# Patient Record
Sex: Female | Born: 1945
Health system: Southern US, Community
[De-identification: ages and names within clinical notes are randomized; demographics above are authoritative.]

## PROBLEM LIST (undated history)

## (undated) DIAGNOSIS — R011 Cardiac murmur, unspecified: Secondary | ICD-10-CM

## (undated) DIAGNOSIS — H409 Unspecified glaucoma: Secondary | ICD-10-CM

## (undated) DIAGNOSIS — H269 Unspecified cataract: Secondary | ICD-10-CM

## (undated) DIAGNOSIS — M1990 Unspecified osteoarthritis, unspecified site: Secondary | ICD-10-CM

## (undated) DIAGNOSIS — M810 Age-related osteoporosis without current pathological fracture: Secondary | ICD-10-CM

## (undated) DIAGNOSIS — M4800 Spinal stenosis, site unspecified: Secondary | ICD-10-CM

## (undated) DIAGNOSIS — F419 Anxiety disorder, unspecified: Secondary | ICD-10-CM

## (undated) DIAGNOSIS — K635 Polyp of colon: Secondary | ICD-10-CM

## (undated) DIAGNOSIS — C801 Malignant (primary) neoplasm, unspecified: Secondary | ICD-10-CM

## (undated) DIAGNOSIS — I10 Essential (primary) hypertension: Secondary | ICD-10-CM

## (undated) DIAGNOSIS — M8580 Other specified disorders of bone density and structure, unspecified site: Secondary | ICD-10-CM

## (undated) DIAGNOSIS — J8410 Pulmonary fibrosis, unspecified: Secondary | ICD-10-CM

## (undated) DIAGNOSIS — I059 Rheumatic mitral valve disease, unspecified: Secondary | ICD-10-CM

## (undated) DIAGNOSIS — F32A Depression, unspecified: Secondary | ICD-10-CM

## (undated) DIAGNOSIS — T7840XA Allergy, unspecified, initial encounter: Secondary | ICD-10-CM

## (undated) DIAGNOSIS — K219 Gastro-esophageal reflux disease without esophagitis: Secondary | ICD-10-CM

## (undated) DIAGNOSIS — E785 Hyperlipidemia, unspecified: Secondary | ICD-10-CM

## (undated) DIAGNOSIS — I350 Nonrheumatic aortic (valve) stenosis: Secondary | ICD-10-CM

## (undated) HISTORY — DX: Unspecified osteoarthritis, unspecified site: M19.90

## (undated) HISTORY — DX: Spinal stenosis, site unspecified: M48.00

## (undated) HISTORY — DX: Essential (primary) hypertension: I10

## (undated) HISTORY — DX: Rheumatic mitral valve disease, unspecified: I05.9

## (undated) HISTORY — DX: Malignant (primary) neoplasm, unspecified: C80.1

## (undated) HISTORY — DX: Anxiety disorder, unspecified: F41.9

## (undated) HISTORY — DX: Depression, unspecified: F32.A

## (undated) HISTORY — DX: Nonrheumatic aortic (valve) stenosis: I35.0

## (undated) HISTORY — DX: Gastro-esophageal reflux disease without esophagitis: K21.9

## (undated) HISTORY — DX: Polyp of colon: K63.5

## (undated) HISTORY — DX: Allergy, unspecified, initial encounter: T78.40XA

## (undated) HISTORY — DX: Other specified disorders of bone density and structure, unspecified site: M85.80

## (undated) HISTORY — DX: Hyperlipidemia, unspecified: E78.5

## (undated) HISTORY — DX: Cardiac murmur, unspecified: R01.1

## (undated) HISTORY — DX: Age-related osteoporosis without current pathological fracture: M81.0

## (undated) HISTORY — PX: WISDOM TOOTH EXTRACTION: SHX21

## (undated) HISTORY — PX: BREAST CYST ASPIRATION: SHX578

## (undated) HISTORY — DX: Unspecified cataract: H26.9

## (undated) HISTORY — PX: TUBAL LIGATION: SHX77

## (undated) HISTORY — DX: Pulmonary fibrosis, unspecified: J84.10

## (undated) HISTORY — DX: Unspecified glaucoma: H40.9

## (undated) NOTE — *Deleted (*Deleted)
Health Maintenance Due  Topic Date Due  . INFLUENZA VACCINE  12/09/2019   Depression screen [facility] 2/9 12/14/2019 10/24/2019 09/13/2019  Decreased Interest 0 0 0  Down, Depressed, Hopeless 0 0 0  PHQ - 2 Score 0 0 0  Altered sleeping 0 0 0  Tired, decreased energy 1 0 0  Change in appetite 1 0 0  Feeling bad or failure about yourself  0 0 0  Trouble concentrating 0 0 0  Moving slowly or fidgety/restless 0 0 0  Suicidal thoughts 0 0 0  PHQ-9 Score 2 0 0  Difficult doing work/chores Not difficult at all Not difficult at all Not difficult at all

---

## 2002-05-28 ENCOUNTER — Encounter: Payer: Self-pay | Admitting: Internal Medicine

## 2002-05-28 ENCOUNTER — Encounter: Admission: RE | Admit: 2002-05-28 | Discharge: 2002-05-28 | Payer: Self-pay | Admitting: Internal Medicine

## 2003-05-28 ENCOUNTER — Other Ambulatory Visit: Admission: RE | Admit: 2003-05-28 | Discharge: 2003-05-28 | Payer: Self-pay | Admitting: Obstetrics and Gynecology

## 2004-05-29 ENCOUNTER — Other Ambulatory Visit: Admission: RE | Admit: 2004-05-29 | Discharge: 2004-05-29 | Payer: Self-pay | Admitting: Obstetrics and Gynecology

## 2005-06-11 ENCOUNTER — Other Ambulatory Visit: Admission: RE | Admit: 2005-06-11 | Discharge: 2005-06-11 | Payer: Self-pay | Admitting: Obstetrics and Gynecology

## 2005-10-26 ENCOUNTER — Ambulatory Visit: Payer: Self-pay | Admitting: Internal Medicine

## 2005-11-11 ENCOUNTER — Ambulatory Visit: Payer: Self-pay | Admitting: Internal Medicine

## 2005-12-08 ENCOUNTER — Ambulatory Visit: Payer: Self-pay | Admitting: Internal Medicine

## 2005-12-08 ENCOUNTER — Encounter: Payer: Self-pay | Admitting: Internal Medicine

## 2005-12-08 ENCOUNTER — Encounter (INDEPENDENT_AMBULATORY_CARE_PROVIDER_SITE_OTHER): Payer: Self-pay | Admitting: Specialist

## 2005-12-08 LAB — HM COLONOSCOPY

## 2006-02-24 ENCOUNTER — Ambulatory Visit: Payer: Self-pay | Admitting: Internal Medicine

## 2006-04-27 ENCOUNTER — Ambulatory Visit: Payer: Self-pay | Admitting: Internal Medicine

## 2006-04-27 LAB — CONVERTED CEMR LAB
ALT: 30 units/L (ref 0–40)
AST: 28 units/L (ref 0–37)
Albumin: 4 g/dL (ref 3.5–5.2)
Alkaline Phosphatase: 75 units/L (ref 39–117)
Bilirubin, Direct: 0.2 mg/dL (ref 0.0–0.3)
Chol/HDL Ratio, serum: 3
Cholesterol: 141 mg/dL (ref 0–200)
HDL: 46.4 mg/dL (ref >39.0)
LDL Cholesterol: 83 mg/dL (ref 0–99)
Total Bilirubin: 1.2 mg/dL (ref 0.3–1.2)
Total Protein: 7.2 g/dL (ref 6.0–8.3)
Triglyceride fasting, serum: 59 mg/dL (ref 0–149)
VLDL: 12 mg/dL (ref 0–40)

## 2006-05-25 ENCOUNTER — Ambulatory Visit: Payer: Self-pay | Admitting: Internal Medicine

## 2006-08-09 ENCOUNTER — Ambulatory Visit: Payer: Self-pay | Admitting: Internal Medicine

## 2006-09-10 ENCOUNTER — Emergency Department (HOSPITAL_COMMUNITY): Admission: EM | Admit: 2006-09-10 | Discharge: 2006-09-10 | Payer: Self-pay | Admitting: Emergency Medicine

## 2006-09-16 ENCOUNTER — Ambulatory Visit: Payer: Self-pay | Admitting: Internal Medicine

## 2006-11-02 ENCOUNTER — Encounter (INDEPENDENT_AMBULATORY_CARE_PROVIDER_SITE_OTHER): Payer: Self-pay | Admitting: Internal Medicine

## 2006-11-02 LAB — CONVERTED CEMR LAB
Cholesterol: 160 mg/dL
HDL: 53 mg/dL
LDL Cholesterol: 88 mg/dL
Triglycerides: 95 mg/dL

## 2006-12-06 DIAGNOSIS — K219 Gastro-esophageal reflux disease without esophagitis: Secondary | ICD-10-CM

## 2006-12-06 DIAGNOSIS — I059 Rheumatic mitral valve disease, unspecified: Secondary | ICD-10-CM

## 2006-12-06 DIAGNOSIS — E785 Hyperlipidemia, unspecified: Secondary | ICD-10-CM | POA: Insufficient documentation

## 2006-12-06 DIAGNOSIS — I10 Essential (primary) hypertension: Secondary | ICD-10-CM

## 2006-12-06 HISTORY — DX: Essential (primary) hypertension: I10

## 2006-12-06 HISTORY — DX: Rheumatic mitral valve disease, unspecified: I05.9

## 2006-12-06 HISTORY — DX: Gastro-esophageal reflux disease without esophagitis: K21.9

## 2006-12-06 HISTORY — DX: Hyperlipidemia, unspecified: E78.5

## 2006-12-21 ENCOUNTER — Ambulatory Visit: Payer: Self-pay | Admitting: Internal Medicine

## 2006-12-21 DIAGNOSIS — Z86010 Personal history of colonic polyps: Secondary | ICD-10-CM

## 2006-12-21 LAB — CONVERTED CEMR LAB
Cholesterol, target level: 200 mg/dL
HDL goal, serum: 40 mg/dL
LDL Goal: 130 mg/dL

## 2007-02-27 ENCOUNTER — Encounter: Payer: Self-pay | Admitting: Internal Medicine

## 2007-03-01 ENCOUNTER — Telehealth (INDEPENDENT_AMBULATORY_CARE_PROVIDER_SITE_OTHER): Payer: Self-pay | Admitting: *Deleted

## 2007-03-02 ENCOUNTER — Ambulatory Visit: Payer: Self-pay | Admitting: Internal Medicine

## 2007-07-05 ENCOUNTER — Encounter: Payer: Self-pay | Admitting: Internal Medicine

## 2007-07-19 ENCOUNTER — Ambulatory Visit: Payer: Self-pay | Admitting: Internal Medicine

## 2007-07-21 LAB — CONVERTED CEMR LAB
Albumin: 4 g/dL (ref 3.5–5.2)
Alkaline Phosphatase: 62 units/L (ref 39–117)
LDL Cholesterol: 77 mg/dL (ref 0–99)
Total CHOL/HDL Ratio: 2.8
VLDL: 12 mg/dL (ref 0–40)

## 2007-11-17 ENCOUNTER — Ambulatory Visit: Payer: Self-pay | Admitting: Internal Medicine

## 2007-12-01 ENCOUNTER — Ambulatory Visit: Payer: Self-pay | Admitting: Internal Medicine

## 2007-12-01 LAB — CONVERTED CEMR LAB
Bilirubin Urine: NEGATIVE
Blood in Urine, dipstick: NEGATIVE
Ketones, urine, test strip: NEGATIVE
Protein, U semiquant: NEGATIVE
Urobilinogen, UA: 0.2
pH: 7

## 2007-12-04 ENCOUNTER — Encounter: Payer: Self-pay | Admitting: Internal Medicine

## 2007-12-06 ENCOUNTER — Telehealth: Payer: Self-pay | Admitting: Internal Medicine

## 2007-12-20 ENCOUNTER — Ambulatory Visit: Payer: Self-pay | Admitting: Internal Medicine

## 2007-12-22 ENCOUNTER — Telehealth: Payer: Self-pay | Admitting: Internal Medicine

## 2008-02-20 ENCOUNTER — Ambulatory Visit: Payer: Self-pay | Admitting: Internal Medicine

## 2008-03-21 ENCOUNTER — Telehealth: Payer: Self-pay | Admitting: Internal Medicine

## 2008-03-25 ENCOUNTER — Ambulatory Visit: Payer: Self-pay | Admitting: Internal Medicine

## 2008-03-25 LAB — CONVERTED CEMR LAB
CK-MB: 1.2 ng/mL (ref 0.3–4.0)
Total CK: 61 units/L (ref 7–177)

## 2008-10-18 ENCOUNTER — Encounter: Payer: Self-pay | Admitting: Internal Medicine

## 2008-10-23 ENCOUNTER — Encounter: Payer: Self-pay | Admitting: Internal Medicine

## 2009-02-17 ENCOUNTER — Ambulatory Visit: Payer: Self-pay | Admitting: Internal Medicine

## 2009-02-28 ENCOUNTER — Encounter (INDEPENDENT_AMBULATORY_CARE_PROVIDER_SITE_OTHER): Payer: Self-pay | Admitting: *Deleted

## 2009-03-12 ENCOUNTER — Telehealth: Payer: Self-pay | Admitting: Internal Medicine

## 2009-05-10 LAB — HM COLONOSCOPY: HM Colonoscopy: NORMAL

## 2009-09-08 ENCOUNTER — Ambulatory Visit: Payer: Self-pay | Admitting: Family Medicine

## 2009-10-27 ENCOUNTER — Encounter: Payer: Self-pay | Admitting: Internal Medicine

## 2009-11-19 ENCOUNTER — Telehealth: Payer: Self-pay | Admitting: Internal Medicine

## 2010-04-08 ENCOUNTER — Encounter (INDEPENDENT_AMBULATORY_CARE_PROVIDER_SITE_OTHER): Payer: Self-pay | Admitting: *Deleted

## 2010-05-10 LAB — HM MAMMOGRAPHY: HM Mammogram: NORMAL

## 2010-05-10 LAB — HM PAP SMEAR: HM Pap smear: NORMAL

## 2010-05-14 ENCOUNTER — Other Ambulatory Visit: Payer: Self-pay | Admitting: Internal Medicine

## 2010-05-14 ENCOUNTER — Ambulatory Visit: Payer: No Typology Code available for payment source | Admitting: Internal Medicine

## 2010-05-14 ENCOUNTER — Ambulatory Visit
Admission: RE | Admit: 2010-05-14 | Discharge: 2010-05-14 | Payer: Self-pay | Source: Home / Self Care | Attending: Internal Medicine | Admitting: Internal Medicine

## 2010-05-14 ENCOUNTER — Encounter: Payer: Self-pay | Admitting: Internal Medicine

## 2010-05-14 DIAGNOSIS — I350 Nonrheumatic aortic (valve) stenosis: Secondary | ICD-10-CM

## 2010-05-14 DIAGNOSIS — I351 Nonrheumatic aortic (valve) insufficiency: Secondary | ICD-10-CM | POA: Insufficient documentation

## 2010-05-14 HISTORY — DX: Nonrheumatic aortic (valve) stenosis: I35.0

## 2010-05-14 NOTE — Assessment & Plan Note (Signed)
Needs followup labs. We'll check today.

## 2010-05-14 NOTE — Assessment & Plan Note (Signed)
Well-controlled. Continue current medications. Check laboratory work today.

## 2010-05-14 NOTE — Progress Notes (Signed)
Subjective:     Patient ID: Destiny Acosta is a 65 y.o. female.  HPI CC:  f/u and fasting.  History of Present Illness:   patient comes in for followup. She has hyperlipidemia. His fall. She has hypertension. Needs laboratory followup. In addition she's recently been treated for onychomycosis with Lamisil.   She is followed at [facility] 4 aortic valve disorder.  She denies any other complaints and a complete review of systems.  Current Problems (verified):  1)  Aortic Stenosis-follwed At [facility]  (ICD-424.1) 2)  Colonic Polyps, Hx of  (ICD-V12.72) 3)  Family History Diabetes 1st Degree Relative  (ICD-V18.0) 4)  Family History of Colon Ca 1st Degree Relative <60  (ICD-V16.0) 5)  Mitral Valve Prolapse  (ICD-424.0) 6)  Hypertension  (ICD-401.9) 7)  Hyperlipidemia  (ICD-272.4) 8)  Gerd  (ICD-530.81)  Current Medications (verified):  1)  Lisinopril 20 Mg Tabs (Lisinopril) .Marland Kitchen.. 1 Tablet By Mouth Daily 2)  Multivitamins   Tabs (Multiple Vitamin) .... Once Daily 3)  Magnesium 250 Mg  Tabs (Magnesium) .... Two Times A Day 4)  Caltrate 600+d Plus 600-400 Mg-Unit  Tabs (Calcium Carbonate-Vit D-Min) .... Take 1 Tablet By Mouth Two Times A Day 5)  Unisom 25 Mg Tabs (Doxylamine Succinate (Sleep)) .... 1/2 -1 Every Night 6)  Estring Vaginal Ring 2mg  .... Change Every 90 Days As Directed 7)  Tretinoin 0.1 % Crea (Tretinoin) .... Use Nightly As Directed 8)  Fexofenadine Hcl 180 Mg Tabs (Fexofenadine Hcl) .Marland Kitchen.. 1 Once Daily As Needed Allergies 9)  Lipitor 10 Mg Tabs (Atorvastatin Calcium) .... 1/2-1 Q Hs As Directed  Allergies (verified):  1)  Codeine Phosphate (Codeine Phosphate)  Past History:  Past Medical History: Last updated: 12/21/2006 mitral valve prolapse,  SBE prophylaxis GERD Hyperlipidemia Hypertension Colonic polyps, hx of  Past Surgical History: Last updated: 12/06/2006 Tubal ligation, bilateral  Family History: Last updated:  12/21/2006 father colon ca Family History of Colon CA 1st degree relative <60  Family History Diabetes 1st degree relative  Social History: Last updated: 12/21/2006 Married Never Smoked Alcohol use-yes Regular exercise-no The following portions of the patient's history were reviewed and updated as appropriate: allergies, current medications, past family history, past medical history, past social history, past surgical history and problem list.  Review of Systems     Objective:   Physical Exam    well-developed well-nourished female in no acute distress. HEENT exam atraumatic, supple neck supple without lymphadenopathy or thyromegaly. No jugular venous distention. Chest clear to auscultation cardiac exam S1-S2 are regular. She does have a 2/6 systolic ejection murmur. Extremities no edema.

## 2010-05-14 NOTE — Assessment & Plan Note (Signed)
Patient has regular followup . she has no symptoms currently.

## 2010-05-15 LAB — BASIC METABOLIC PANEL
BUN: 12 mg/dL (ref 6–23)
CO2: 29 mEq/L (ref 19–32)
Calcium: 9.6 mg/dL (ref 8.4–10.5)
Chloride: 105 mEq/L (ref 96–112)
Creatinine, Ser: 0.6 mg/dL (ref 0.4–1.2)
GFR: 110.7 mL/min (ref >60.00)
Glucose, Bld: 76 mg/dL (ref 70–99)
Potassium: 4.3 mEq/L (ref 3.5–5.1)
Sodium: 141 mEq/L (ref 135–145)

## 2010-05-15 LAB — LIPID PANEL
Cholesterol: 158 mg/dL (ref 0–200)
HDL: 54.5 mg/dL (ref >39.00)
LDL Cholesterol: 92 mg/dL (ref 0–99)
Total CHOL/HDL Ratio: 3
Triglycerides: 58 mg/dL (ref 0.0–149.0)
VLDL: 11.6 mg/dL (ref 0.0–40.0)

## 2010-05-15 LAB — HEPATIC FUNCTION PANEL
ALT: 21 U/L (ref 0–35)
AST: 22 U/L (ref 0–37)
Albumin: 4.3 g/dL (ref 3.5–5.2)
Alkaline Phosphatase: 54 U/L (ref 39–117)
Bilirubin, Direct: 0.1 mg/dL (ref 0.0–0.3)
Total Bilirubin: 0.9 mg/dL (ref 0.3–1.2)
Total Protein: 7.1 g/dL (ref 6.0–8.3)

## 2010-05-15 LAB — TSH: TSH: 0.87 u[IU]/mL (ref 0.35–5.50)

## 2010-05-20 ENCOUNTER — Telehealth: Payer: Self-pay | Admitting: Internal Medicine

## 2010-06-09 NOTE — Assessment & Plan Note (Signed)
Summary: ?CRACKED RIBS FROM SKIING INJURY/CJR   Vital Signs:  Patient profile:   65 year old female Temp:     98.2 degrees F oral BP sitting:   140 / 82  (left arm) Cuff size:   regular  Vitals Entered By: Sid Falcon LPN (Sep 08, 9809 11:47 AM) CC: Left ribs discomfort X 1 week following water skiing fall   History of Present Illness: Patient seem following skiing injury. Ski came back and hit left rib cage area just below the left breast. Injury occurred 8 days ago. No visible bruising but has some continued soreness and mild pleuritic pain. No hemoptysis. No dyspnea. Ibuprofen helps. Adequate pain control with ibuprofen  Allergies: 1)  Codeine Phosphate (Codeine Phosphate)  Past History:  Past Medical History: Last updated: 12/21/2006 mitral valve prolapse,  SBE prophylaxis GERD Hyperlipidemia Hypertension Colonic polyps, hx of  Review of Systems  The patient denies chest pain, dyspnea on exertion, prolonged cough, and hemoptysis.    Physical Exam  General:  Well-developed,well-nourished,in no acute distress; alert,appropriate and cooperative throughout examination Lungs:  Normal respiratory effort, chest expands symmetrically. Lungs are clear to auscultation, no crackles or wheezes. Heart:  Normal rate and regular rhythm. S1 and S2 normal without gallop, murmur, click, rub or other extra sounds. Msk:  no visible bruising left chest wall. Tenderness left anterior rib cage area just below the left breast.   Impression & Recommendations:  Problem # 1:  CONTUSION, LEFT RIB (ICD-922.1)  offered x-rays but patient wishes to defer at this time. Provided Ace wrap for comfort she knows to take some deep breaths daily. Continue ibuprofen for pain relief.  Orders: Ace Wraps 3-5 in/yard  (B1478)  Complete Medication List: 1)  Aspirin Ec 81 Mg Tbec (Aspirin) .... Take 2 tablet by mouth once daily hold 2)  Lisinopril 20 Mg Tabs (Lisinopril) .Marland Kitchen.. 1 tablet by mouth  daily 3)  Multivitamins Tabs (Multiple vitamin) .... Once daily 4)  Omega-3 1000 Mg Caps (Omega-3 fatty acids) .... Take 1 capsule by mouth once a day 5)  Magnesium 250 Mg Tabs (Magnesium) .... Two times a day 6)  Caltrate 600+d Plus 600-400 Mg-unit Tabs (Calcium carbonate-vit d-min) .... Take 1 tablet by mouth two times a day 7)  Unisom 25 Mg Tabs (Doxylamine succinate (sleep)) .... 1/2 -1 every night 8)  Terbinafine Hcl 250 Mg Tabs (Terbinafine hcl) .... Once daily 9)  Estring Vaginal Ring 2mg   .... Change every 90 days as directed 10)  Tretinoin 0.1 % Crea (Tretinoin) .... Use nightly as directed 11)  Fexofenadine Hcl 180 Mg Tabs (Fexofenadine hcl) .Marland Kitchen.. 1 once daily as needed allergies 12)  Lipitor 10 Mg Tabs (Atorvastatin calcium) .... 1/2-1 q hs as directed  Patient Instructions: 1)  Continued ibuprofen as needed for comfort. 2)  Follow up immediately if you develop any fever, increased cough, or shortness of breath.

## 2010-06-09 NOTE — Letter (Signed)
Summary: New Patient letter  [facility]  [address]   [place], [place] [address]   Phone: [phone]  Fax: [phone]       04/08/2010 MRN: [id number]  [facility] [address] [address] [place], [place]  [address]  Dear Ms. Connett,  Welcome to the Gastroenterology Division at [facility].    You are scheduled to see Dr. Marina Goodell on 06/01/2010 at 9:15 am on the 3rd floor at [facility], [address]  We ask that you try to arrive at our office 15 minutes prior to your appointment time to allow for check-in.  We would like you to complete the enclosed self-administered evaluation form prior to your visit and bring it with you on the day of your appointment.  We will review it with you.  Also, please bring a complete list of all your medications or, if you prefer, bring the medication bottles and we will list them.  Please bring your insurance card so that we may make a copy of it.  If your insurance requires a referral to see a specialist, please bring your referral form from your primary care physician.  Co-payments are due at the time of your visit and may be paid by cash, check or credit card.     Your office visit will consist of a consult with your physician (includes a physical exam), any laboratory testing he/she may order, scheduling of any necessary diagnostic testing (e.g. x-ray, ultrasound, CT-scan), and scheduling of a procedure (e.g. Endoscopy, Colonoscopy) if required.  Please allow enough time on your schedule to allow for any/all of these possibilities.    If you cannot keep your appointment, please call [phone] to cancel or reschedule prior to your appointment date.  This allows [place] the opportunity to schedule an appointment for another patient in need of care.  If you do not cancel or reschedule by 5 p.m. the business day prior to your appointment date, you will be charged a $50.00 late cancellation/no-show fee.    Thank you for choosing  [facility] for your medical needs.  We appreciate the opportunity to care for you.  Please visit [place] at our website  to learn more about our practice.                     Sincerely,                                                             The Gastroenterology Division

## 2010-06-09 NOTE — Letter (Signed)
Summary: [facility]  [facility]   Imported By: Maryln Gottron 11/28/2009 12:59:09  _____________________________________________________________________  External Attachment:    Type:   Image     Comment:   External Document

## 2010-06-09 NOTE — Progress Notes (Signed)
Summary: refill for mail order-lisinopril  Phone Note Refill Request Call back at [phone] Message from:  Patient--live call  Refills Requested: Medication #1:  LISINOPRIL 20 MG TABS 1 tablet by mouth daily pt needs to pick up rx for mail order. please call her when ready. wants to pickup today.  Initial call taken by: Warnell Forester,  November 19, 2009 8:49 AM  Follow-up for Phone Call        Rx done and ready for pick up.Patient notified.  Follow-up by: Gladis Riffle, RN,  November 19, 2009 1:54 PM    Prescriptions: LISINOPRIL 20 MG TABS (LISINOPRIL) 1 tablet by mouth daily  #90 x 3   Entered by:   Gladis Riffle, RN   Authorized by:   Birdie Sons MD   Signed by:   Gladis Riffle, RN on 11/19/2009   Method used:   Print then Give to Patient   RxID:   [id number]

## 2010-06-11 NOTE — Assessment & Plan Note (Signed)
Summary: fu per pt/pt will come in fasting/njr   Vital Signs:  Patient profile:   65 year old female Height:      65 inches Weight:      155 pounds BMI:     25.89 Temp:     98.8 degrees F oral Pulse rate:   68 / minute Pulse rhythm:   regular BP sitting:   122 / 74  (left arm) Cuff size:   regular CC: f/u, fasting   CC:  f/u and fasting.  History of Present Illness:  patient comes in for followup. She has hyperlipidemia. His fall. She has hypertension. Needs laboratory followup. In addition she's recently been treated for onychomycosis with Lamisil.   She is followed at [facility] 4 aortic valve disorder.  She denies any other complaints and a complete review of systems.  Current Problems (verified): 1)  Aortic Stenosis-follwed At [facility]  (ICD-424.1) 2)  Colonic Polyps, Hx of  (ICD-V12.72) 3)  Family History Diabetes 1st Degree Relative  (ICD-V18.0) 4)  Family History of Colon Ca 1st Degree Relative <60  (ICD-V16.0) 5)  Mitral Valve Prolapse  (ICD-424.0) 6)  Hypertension  (ICD-401.9) 7)  Hyperlipidemia  (ICD-272.4) 8)  Gerd  (ICD-530.81)  Current Medications (verified): 1)  Lisinopril 20 Mg Tabs (Lisinopril) .Marland Kitchen.. 1 Tablet By Mouth Daily 2)  Multivitamins   Tabs (Multiple Vitamin) .... Once Daily 3)  Magnesium 250 Mg  Tabs (Magnesium) .... Two Times A Day 4)  Caltrate 600+d Plus 600-400 Mg-Unit  Tabs (Calcium Carbonate-Vit D-Min) .... Take 1 Tablet By Mouth Two Times A Day 5)  Unisom 25 Mg Tabs (Doxylamine Succinate (Sleep)) .... 1/2 -1 Every Night 6)  Estring Vaginal Ring 2mg  .... Change Every 90 Days As Directed 7)  Tretinoin 0.1 % Crea (Tretinoin) .... Use Nightly As Directed 8)  Fexofenadine Hcl 180 Mg Tabs (Fexofenadine Hcl) .Marland Kitchen.. 1 Once Daily As Needed Allergies 9)  Lipitor 10 Mg Tabs (Atorvastatin Calcium) .... 1/2-1 Q Hs As Directed  Allergies (verified): 1)  Codeine Phosphate (Codeine Phosphate)  Past History:  Past Medical  History: Last updated: 12/21/2006 mitral valve prolapse,  SBE prophylaxis GERD Hyperlipidemia Hypertension Colonic polyps, hx of  Past Surgical History: Last updated: 12/06/2006 Tubal ligation, bilateral  Family History: Last updated: 12/21/2006 father colon ca Family History of Colon CA 1st degree relative <60  Family History Diabetes 1st degree relative  Social History: Last updated: 12/21/2006 Married Never Smoked Alcohol use-yes Regular exercise-no  Risk Factors: Alcohol Use: <1 (12/21/2006) Exercise: no (12/21/2006)  Risk Factors: Smoking Status: never (02/17/2009)  Physical Exam  General:   well-developed well-nourished female in no acute distress HEENT exam atraumatic, normocephalic, neck supple. No jugular venous distention. Chest clear to auscultation cardiac exam S1-S2 are regular. Abdominal exam active bowel sounds, soft. Extremities no edema.   Impression & Recommendations: see [facility] for documentation  Complete Medication List: 1)  Lisinopril 20 Mg Tabs (Lisinopril) .Marland Kitchen.. 1 tablet by mouth daily 2)  Multivitamins Tabs (Multiple vitamin) .... Once daily 3)  Magnesium 250 Mg Tabs (Magnesium) .... Two times a day 4)  Caltrate 600+d Plus 600-400 Mg-unit Tabs (Calcium carbonate-vit d-min) .... Take 1 tablet by mouth two times a day 5)  Unisom 25 Mg Tabs (Doxylamine succinate (sleep)) .... 1/2 -1 every night 6)  Estring Vaginal Ring 2mg   .... Change every 90 days as directed 7)  Tretinoin 0.1 % Crea (Tretinoin) .... Use nightly as directed 8)  Fexofenadine Hcl 180 Mg  Tabs (Fexofenadine hcl) .Marland Kitchen.. 1 once daily as needed allergies 9)  Lipitor 10 Mg Tabs (Atorvastatin calcium) .... 1/2-1 q hs as directed  Other Orders: Venipuncture (57846) Specimen Handling (96295) TLB-BMP (Basic Metabolic Panel-BMET) (80048-METABOL) TLB-Hepatic/Liver Function Pnl (80076-HEPATIC) TLB-Lipid Panel (80061-LIPID) Venipuncture (28413) TLB-Lipid Panel  (80061-LIPID) TLB-Hepatic/Liver Function Pnl (80076-HEPATIC) TLB-TSH (Thyroid Stimulating Hormone) (84443-TSH) TLB-BMP (Basic Metabolic Panel-BMET) (80048-METABOL)   Orders Added: 1)  Venipuncture [24401] 2)  Specimen Handling [99000] 3)  TLB-BMP (Basic Metabolic Panel-BMET) [80048-METABOL] 4)  TLB-Hepatic/Liver Function Pnl [80076-HEPATIC] 5)  TLB-Lipid Panel [80061-LIPID] 6)  Venipuncture [36415] 7)  TLB-Lipid Panel [80061-LIPID] 8)  TLB-Hepatic/Liver Function Pnl [80076-HEPATIC] 9)  TLB-TSH (Thyroid Stimulating Hormone) [84443-TSH] 10)  TLB-BMP (Basic Metabolic Panel-BMET) [80048-METABOL] 11)  Est. Patient Level IV [02725]  Appended Document: fu per pt/pt will come in fasting/njr Patient ID: Destiny Acosta is a 65 y.o. female.  HPI  CC: f/u and fasting.  History of Present Illness: patient comes in for followup. She has hyperlipidemia. His fall. She has hypertension. Needs laboratory followup. In addition she's recently been treated for onychomycosis with Lamisil. She is followed at [facility] 4 aortic valve disorder.  She denies any other complaints and a complete review of systems.  Current Problems (verified): 1) Aortic Stenosis-follwed At [facility] (ICD-424.1)  2) Colonic Polyps, Hx of (ICD-V12.72)  3) Family History Diabetes 1st Degree Relative (ICD-V18.0)  4) Family History of Colon Ca 1st Degree Relative <60 (ICD-V16.0)  5) Mitral Valve Prolapse (ICD-424.0)  6) Hypertension (ICD-401.9)  7) Hyperlipidemia (ICD-272.4)  8) Gerd (ICD-530.81)  Current Medications (verified): 1) Lisinopril 20 Mg Tabs (Lisinopril) .Marland Kitchen.. 1 Tablet By Mouth Daily  2) Multivitamins Tabs (Multiple Vitamin) .... Once Daily  3) Magnesium 250 Mg Tabs (Magnesium) .... Two Times A Day  4) Caltrate 600+d Plus 600-400 Mg-Unit Tabs (Calcium Carbonate-Vit D-Min) .... Take 1 Tablet By Mouth Two Times A Day  5) Unisom 25 Mg Tabs (Doxylamine Succinate (Sleep)) .... 1/2 -1 Every  Night  6) Estring Vaginal Ring 2mg  .... Change Every 90 Days As Directed  7) Tretinoin 0.1 % Crea (Tretinoin) .... Use Nightly As Directed  8) Fexofenadine Hcl 180 Mg Tabs (Fexofenadine Hcl) .Marland Kitchen.. 1 Once Daily As Needed Allergies  9) Lipitor 10 Mg Tabs (Atorvastatin Calcium) .... 1/2-1 Q Hs As Directed  Allergies (verified): 1) Codeine Phosphate (Codeine Phosphate)  Past History:  Past Medical History:  Last updated: 12/21/2006  mitral valve prolapse, SBE prophylaxis  GERD  Hyperlipidemia  Hypertension  Colonic polyps, hx of  Past Surgical History:  Last updated: 12/06/2006  Tubal ligation, bilateral  Family History:  Last updated: 12/21/2006  father colon ca  Family History of Colon CA 1st degree relative <60  Family History Diabetes 1st degree relative  Social History:  Last updated: 12/21/2006  Married  Never Smoked  Alcohol use-yes  Regular exercise-no  The following portions of the patient's history were reviewed and updated as appropriate: allergies, current medications, past family history, past medical history, past social history, past surgical history and problem list.  Review of Systems   Objective: Physical Exam  well-developed well-nourished female in no acute distress. HEENT exam atraumatic, supple neck supple without lymphadenopathy or thyromegaly. No jugular venous distention. Chest clear to auscultation cardiac exam S1-S2 are regular. She does have a 2/6 systolic ejection murmur. Extremities no edema.     Hypertension - Judie Petit, MD 05/14/2010 9:39 AM Signed  Well-controlled. Continue current medications. Check laboratory work today.Aortal stenosis - [facility]  Sherilyn Cooter, MD 05/14/2010 9:39 AM Signed  Patient has regular followup . she has no symptoms currently.Hyperlipidemia - Judie Petit, MD 05/14/2010 9:36 AM Signed  Needs followup labs. We'll check today.

## 2010-06-11 NOTE — Progress Notes (Signed)
Summary: question about lipitor  Phone Note Call from Patient Call back at Home Phone [phone]   Caller: Patient Call For: Asharia Lotter Summary of Call: pt said that it was on the news that for postmenopausal women are at risk for developing type 2 diabetes increases if you are on statins and in particular lipitor.  She is wondering if she can stop taking the lipitor Initial call taken by: Alfred Levins, CMA,  May 20, 2010 2:01 PM  Follow-up for Phone Call         discontinue Lipitor. check lipid panel in 8 weeks. Follow-up by: Birdie Sons MD,  May 21, 2010 4:33 AM  Additional Follow-up for Phone Call Additional follow up Details #1::        pt aware Additional Follow-up by: Alfred Levins, CMA,  May 21, 2010 9:02 AM

## 2010-07-03 ENCOUNTER — Other Ambulatory Visit: Payer: No Typology Code available for payment source

## 2010-07-08 ENCOUNTER — Other Ambulatory Visit: Payer: Self-pay | Admitting: Internal Medicine

## 2010-07-08 ENCOUNTER — Other Ambulatory Visit: Payer: Medicare Other

## 2010-07-08 ENCOUNTER — Encounter (INDEPENDENT_AMBULATORY_CARE_PROVIDER_SITE_OTHER): Payer: Self-pay | Admitting: *Deleted

## 2010-07-08 DIAGNOSIS — E785 Hyperlipidemia, unspecified: Secondary | ICD-10-CM

## 2010-07-08 LAB — LIPID PANEL
HDL: 56.7 mg/dL (ref >39.00)
Triglycerides: 48 mg/dL (ref 0.0–149.0)

## 2010-07-15 ENCOUNTER — Ambulatory Visit (INDEPENDENT_AMBULATORY_CARE_PROVIDER_SITE_OTHER): Payer: No Typology Code available for payment source | Admitting: Internal Medicine

## 2010-07-15 ENCOUNTER — Encounter: Payer: Self-pay | Admitting: Internal Medicine

## 2010-07-15 DIAGNOSIS — I10 Essential (primary) hypertension: Secondary | ICD-10-CM

## 2010-07-15 DIAGNOSIS — I059 Rheumatic mitral valve disease, unspecified: Secondary | ICD-10-CM

## 2010-07-15 DIAGNOSIS — E785 Hyperlipidemia, unspecified: Secondary | ICD-10-CM

## 2010-07-15 NOTE — Assessment & Plan Note (Signed)
Has known valvular disease. She will followup with Dr. Clarene Duke.

## 2010-07-15 NOTE — Assessment & Plan Note (Signed)
Well controlled. Continue current medications  

## 2010-07-15 NOTE — Progress Notes (Signed)
  Subjective:    Patient ID: Destiny Acosta, female    DOB: 1946-02-02, 65 y.o.   MRN: [id number]  HPI  Multiple medical issues and here to f/u lipids Lipids: she stopped lipitor because of memory issues and concern about DM. Risk factors: age, htn, ?family history  htn---tolerating meds  Hx MVP and Aortic stenosis (has f/u echo with dr. Clarene Duke)  Past Medical History  Diagnosis Date  . HYPERLIPIDEMIA 12/06/2006  . HYPERTENSION 12/06/2006  . MITRAL VALVE PROLAPSE 12/06/2006  . GERD 12/06/2006  . COLONIC POLYPS, HX OF 12/21/2006  . Aortal stenosis 05/14/2010   Past Surgical History  Procedure Date  . Tubal ligation     reports that she has never smoked. She does not have any smokeless tobacco history on file. She reports that she drinks alcohol. She reports that she does not use illicit drugs. family history includes Arthritis in her sister; Atrial fibrillation in her father; Colon cancer in her father; Diabetes in her maternal grandmother, mother, and sister; Heart disease (age of onset:65) in her mother; and Heart failure in her mother.    Allergies  Allergen Reactions  . Codeine Phosphate     REACTION: nausea, vomiting     Review of Systems  patient denies chest pain, shortness of breath, orthopnea. Denies lower extremity edema, abdominal pain, change in appetite, change in bowel movements. Patient denies rashes, musculoskeletal complaints. No other specific complaints in a complete review of systems.      Objective:   Physical Exam  Well-developed well-nourished female in no acute distress. HEENT exam atraumatic, normocephalic, extraocular muscles are intact. Neck is supple. No jugular venous distention no thyromegaly. Chest clear to auscultation without increased work of breathing. Cardiac exam S1 and S2 are regular 2/6 sem. Abdominal exam active bowel sounds, soft, nontender. Extremities no edema. Neurologic exam she is alert without any motor sensory deficits. Gait is  normal.        Assessment & Plan:

## 2010-07-15 NOTE — Assessment & Plan Note (Signed)
A long discussion with the patient. Discussed lipids in detail. Reviewed her risk factors in detail. Given her current laboratory values and reluctance to take medications I think it is fine if he stays off statin therapy for the time being. Risks to her that  Decision discussed with the patient. The patient remains active. He lives a healthy lifestyle. Follow up with her in 6 months.

## 2010-07-22 ENCOUNTER — Encounter (INDEPENDENT_AMBULATORY_CARE_PROVIDER_SITE_OTHER): Payer: Self-pay | Admitting: *Deleted

## 2010-07-28 NOTE — Letter (Signed)
Summary: New Patient letter  [facility]  [address] [place], [place] [address]   Phone: [phone]  Fax: [phone]       07/22/2010 MRN: [id number]  [facility] [place] [address] [place], [place]  [address]  [place]  Dear Ms. Cenci,  Welcome to the Gastroenterology Division at [facility].    You are scheduled to see Dr.  Yancey Flemings on September 02, 2010 at 11:00am on the 3rd floor at [facility], [address].  We ask that you try to arrive at our office 15 minutes prior to your appointment time to allow for check-in.  We would like you to complete the enclosed self-administered evaluation form prior to your visit and bring it with you on the day of your appointment.  We will review it with you.  Also, please bring a complete list of all your medications or, if you prefer, bring the medication bottles and we will list them.  Please bring your insurance card so that we may make a copy of it.  If your insurance requires a referral to see a specialist, please bring your referral form from your primary care physician.  Co-payments are due at the time of your visit and may be paid by cash, check or credit card.     Your office visit will consist of a consult with your physician (includes a physical exam), any laboratory testing he/she may order, scheduling of any necessary diagnostic testing (e.g. x-ray, ultrasound, CT-scan), and scheduling of a procedure (e.g. Endoscopy, Colonoscopy) if required.  Please allow enough time on your schedule to allow for any/all of these possibilities.    If you cannot keep your appointment, please call [phone] to cancel or reschedule prior to your appointment date.  This allows [place] the opportunity to schedule an appointment for another patient in need of care.  If you do not cancel or reschedule by 5 p.m. the business day prior to your appointment date, you will be charged a $50.00 late cancellation/no-show fee.    Thank you for  choosing [facility] for your medical needs.  We appreciate the opportunity to care for you.  Please visit [place] at our website  to learn more about our practice.                     Sincerely,                                                             The Gastroenterology Division

## 2010-09-02 ENCOUNTER — Ambulatory Visit: Payer: No Typology Code available for payment source | Admitting: Internal Medicine

## 2010-09-14 ENCOUNTER — Emergency Department (HOSPITAL_BASED_OUTPATIENT_CLINIC_OR_DEPARTMENT_OTHER)
Admission: EM | Admit: 2010-09-14 | Discharge: 2010-09-14 | Disposition: A | Discharge status: Home / Self Care | Payer: Medicare Other | Attending: Emergency Medicine | Admitting: Emergency Medicine

## 2010-09-14 DIAGNOSIS — E7800 Pure hypercholesterolemia, unspecified: Secondary | ICD-10-CM | POA: Insufficient documentation

## 2010-09-14 DIAGNOSIS — N3090 Cystitis, unspecified without hematuria: Secondary | ICD-10-CM | POA: Insufficient documentation

## 2010-09-14 DIAGNOSIS — R300 Dysuria: Secondary | ICD-10-CM | POA: Insufficient documentation

## 2010-09-14 DIAGNOSIS — I10 Essential (primary) hypertension: Secondary | ICD-10-CM | POA: Insufficient documentation

## 2010-09-14 LAB — URINALYSIS, ROUTINE W REFLEX MICROSCOPIC
Bilirubin Urine: NEGATIVE
Glucose, UA: NEGATIVE mg/dL
Ketones, ur: NEGATIVE mg/dL
pH: 7 (ref 5.0–8.0)

## 2010-09-14 LAB — URINE MICROSCOPIC-ADD ON

## 2010-09-15 LAB — URINE CULTURE: Colony Count: >=100000

## 2010-09-16 ENCOUNTER — Telehealth: Payer: Self-pay | Admitting: Internal Medicine

## 2010-09-16 NOTE — Telephone Encounter (Signed)
Pt went to Urgent Care on Sunday night re: Hemmorrhagic cystitis. Pt was told to come in to see pcp after completing antibiotic, which will be on Friday. Pt is req to come in for a repeat ua. Pls advise.

## 2010-09-17 NOTE — Telephone Encounter (Signed)
Pt can come in for a u/a, just put her on the lab schedule.  If urine is positive then she needs to see Dr Cato Mulligan

## 2010-09-22 ENCOUNTER — Other Ambulatory Visit: Payer: Medicare Other | Admitting: Internal Medicine

## 2010-09-22 ENCOUNTER — Telehealth: Payer: Self-pay | Admitting: *Deleted

## 2010-09-24 ENCOUNTER — Other Ambulatory Visit (INDEPENDENT_AMBULATORY_CARE_PROVIDER_SITE_OTHER): Payer: Medicare Other | Admitting: Internal Medicine

## 2010-09-24 DIAGNOSIS — N390 Urinary tract infection, site not specified: Secondary | ICD-10-CM

## 2010-09-24 LAB — POCT URINALYSIS DIPSTICK
Leukocytes, UA: NEGATIVE
Nitrite, UA: NEGATIVE
Protein, UA: NEGATIVE
Urobilinogen, UA: 0.2
pH, UA: 6.5

## 2010-10-16 ENCOUNTER — Encounter: Payer: Self-pay | Admitting: Internal Medicine

## 2010-10-16 ENCOUNTER — Ambulatory Visit (INDEPENDENT_AMBULATORY_CARE_PROVIDER_SITE_OTHER): Payer: Medicare Other | Admitting: Internal Medicine

## 2010-10-16 VITALS — BP 112/76 | HR 86 | Wt 145.0 lb

## 2010-10-16 DIAGNOSIS — N390 Urinary tract infection, site not specified: Secondary | ICD-10-CM | POA: Insufficient documentation

## 2010-10-16 LAB — POCT URINALYSIS DIPSTICK
Bilirubin, UA: NEGATIVE
Blood, UA: NEGATIVE
Nitrite, UA: NEGATIVE
Protein, UA: NEGATIVE
Urobilinogen, UA: 0.2
pH, UA: 7

## 2010-10-16 MED ORDER — SULFAMETHOXAZOLE-TRIMETHOPRIM 800-160 MG PO TABS: 1.0000 | ORAL_TABLET | Freq: Two times a day (BID) | ORAL | Status: AC

## 2010-10-16 NOTE — Progress Notes (Signed)
  Subjective:    Patient ID: Destiny Acosta, female    DOB: Dec 29, 1945, 65 y.o.   MRN: [id number]  HPI Pt presents to clinic for evaluation of possible UTI. Notes one day h/o suprapubic discomfort helped by azo x one. Denies dysuria, hematuria, urinary frequency, fever, chills, n/v or flank pain.  One month ago had UTI associated with gross hematuria resolved with cipro x 5 days. Urine cx reviewed as e. Coli pan sensitive. States h/o ?urethral stricture in the past requiring urology evaluation and ? Dilatation. Has no current hesitancy or difficulty voiding. No exacerbating or alleviating factors. No other complaints.  Reviewed pmh, medications and allergies.   Review of Systems see hpi     Objective:   Physical Exam  Nursing note and vitals reviewed. Constitutional: She appears well-developed and well-nourished. No distress.  HENT:  Head: Normocephalic and atraumatic.  Nose: Nose normal.  Eyes: Conjunctivae are normal. No scleral icterus.  Abdominal: Soft. She exhibits no distension and no mass. There is tenderness in the suprapubic area. There is no rebound and no guarding.  Musculoskeletal:       No cva tenderness  Neurological: She is alert.  Skin: Skin is warm and dry. She is not diaphoretic.  Psychiatric: She has a normal mood and affect.          Assessment & Plan:

## 2010-10-16 NOTE — Assessment & Plan Note (Signed)
UA obtained and reviewed with pt as nl however clinical suspicion remains high. Begin septra ds bid x 7days. Followup if no improvement or worsening.

## 2010-11-24 ENCOUNTER — Encounter: Payer: Self-pay | Admitting: Internal Medicine

## 2010-12-14 ENCOUNTER — Other Ambulatory Visit: Payer: Self-pay | Admitting: *Deleted

## 2010-12-14 MED ORDER — LISINOPRIL 20 MG PO TABS: 20.0000 mg | ORAL_TABLET | Freq: Every day | ORAL | Status: DC

## 2010-12-21 ENCOUNTER — Telehealth: Payer: Self-pay | Admitting: Internal Medicine

## 2010-12-21 ENCOUNTER — Encounter: Payer: Self-pay | Admitting: Internal Medicine

## 2010-12-21 NOTE — Telephone Encounter (Signed)
Pt decided to stay with Dr. Marina Goodell

## 2010-12-21 NOTE — Telephone Encounter (Signed)
Pt was able to work out her schedule and stay with Dr. Marina Goodell.

## 2011-01-13 ENCOUNTER — Other Ambulatory Visit: Payer: No Typology Code available for payment source

## 2011-01-14 ENCOUNTER — Other Ambulatory Visit: Payer: No Typology Code available for payment source | Admitting: Internal Medicine

## 2011-01-20 ENCOUNTER — Ambulatory Visit: Payer: No Typology Code available for payment source | Admitting: Internal Medicine

## 2011-01-22 ENCOUNTER — Other Ambulatory Visit: Payer: No Typology Code available for payment source | Admitting: Internal Medicine

## 2011-02-08 ENCOUNTER — Ambulatory Visit (INDEPENDENT_AMBULATORY_CARE_PROVIDER_SITE_OTHER): Payer: Medicare Other | Admitting: Internal Medicine

## 2011-02-08 ENCOUNTER — Ambulatory Visit (AMBULATORY_SURGERY_CENTER): Payer: Medicare Other | Admitting: *Deleted

## 2011-02-08 ENCOUNTER — Telehealth: Payer: Self-pay | Admitting: *Deleted

## 2011-02-08 ENCOUNTER — Encounter: Payer: Self-pay | Admitting: Internal Medicine

## 2011-02-08 VITALS — Ht 64.0 in | Wt 145.0 lb

## 2011-02-08 DIAGNOSIS — K219 Gastro-esophageal reflux disease without esophagitis: Secondary | ICD-10-CM

## 2011-02-08 DIAGNOSIS — I10 Essential (primary) hypertension: Secondary | ICD-10-CM

## 2011-02-08 DIAGNOSIS — I350 Nonrheumatic aortic (valve) stenosis: Secondary | ICD-10-CM

## 2011-02-08 DIAGNOSIS — I059 Rheumatic mitral valve disease, unspecified: Secondary | ICD-10-CM

## 2011-02-08 DIAGNOSIS — Z1211 Encounter for screening for malignant neoplasm of colon: Secondary | ICD-10-CM

## 2011-02-08 DIAGNOSIS — Z23 Encounter for immunization: Secondary | ICD-10-CM

## 2011-02-08 MED ORDER — PEG-KCL-NACL-NASULF-NA ASC-C 100 G PO SOLR: ORAL | Status: DC

## 2011-02-08 NOTE — Telephone Encounter (Signed)
Pneumonia injection caused itching, and soreness.  Per Dr. Lovell Sheehan, take Benadryl 50 mg q 4- 6 hours prn itching. Notified pt.

## 2011-02-08 NOTE — Assessment & Plan Note (Signed)
No symptoms and off of all medications. She's not having any problems. I will delete from the problem list.

## 2011-02-08 NOTE — Assessment & Plan Note (Signed)
Reviewed echocardiogram. No evidence of mitral valve prolapse. I will delete from the problem list

## 2011-02-08 NOTE — Assessment & Plan Note (Signed)
Reviewed dr. Fredirick Maudlin note Reviewed mecho---nbo treatment necessary

## 2011-02-08 NOTE — Assessment & Plan Note (Signed)
BP Readings from Last 3 Encounters:  02/08/11 126/80  10/16/10 112/76  07/15/10 114/82   Well controlled Continue meds

## 2011-02-08 NOTE — Progress Notes (Signed)
  Subjective:    Patient ID: Destiny Acosta, female    DOB: [birth year], 65 y.o.   MRN: [id number]  HPI  Patient comes in for followup. She has a history of aortic stenosis and hypertension. She is also off of atorvastatin for hyperlipidemia. She has seen her cardiologist at [facility]. I reviewed that note and echocardiogram. Her only complaint is a new rectal rash. She states her rectum will become irritated after bowel movements. It relieved with Desitin.   Past Medical History  Diagnosis Date  . HYPERLIPIDEMIA 12/06/2006  . HYPERTENSION 12/06/2006  . MITRAL VALVE PROLAPSE 12/06/2006  . GERD 12/06/2006  . COLONIC POLYPS, HX OF 12/21/2006  . Aortal stenosis 05/14/2010   Past Surgical History  Procedure Date  . Tubal ligation     reports that she has never smoked. She does not have any smokeless tobacco history on file. She reports that she drinks alcohol. She reports that she does not use illicit drugs. family history includes Arthritis in her sister; Atrial fibrillation in her father; Colon cancer in her father; Diabetes in her maternal grandmother, mother, and sister; Heart disease (age of onset:65) in her mother; and Heart failure in her mother. Allergies  Allergen Reactions  . Codeine Phosphate     REACTION: nausea, vomiting     Review of Systems    patient denies chest pain, shortness of breath, orthopnea. Denies lower extremity edema, abdominal pain, change in appetite, change in bowel movements. Patient denies rashes, musculoskeletal complaints. No other specific complaints in a complete review of systems.    Objective:   Physical Exam  Well-developed female in no acute distress. HEENT exam atraumatic, normocephalic neck supple. Chest clear to auscultation cardiac exam S1-S2 are regular. She is a 2/6 early systolic peaking murmur. Extremities no edema      Assessment & Plan:

## 2011-02-09 ENCOUNTER — Ambulatory Visit (INDEPENDENT_AMBULATORY_CARE_PROVIDER_SITE_OTHER): Payer: Medicare Other | Admitting: Internal Medicine

## 2011-02-09 DIAGNOSIS — T50Z95A Adverse effect of other vaccines and biological substances, initial encounter: Secondary | ICD-10-CM | POA: Insufficient documentation

## 2011-02-09 DIAGNOSIS — T8062XA Other serum reaction due to vaccination, initial encounter: Secondary | ICD-10-CM

## 2011-02-09 NOTE — Assessment & Plan Note (Signed)
65 y/o with localized rxn from pneumovax.  Use cool compress and ibuprofen as directed. Patient advised to call office if symptoms persist or worsen.

## 2011-02-09 NOTE — Progress Notes (Signed)
  Subjective:    Patient ID: Destiny Acosta, female    DOB: [birth year], 65 y.o.   MRN: [id number]  HPI  65 y/o white female c/o left upper arm redness and tenderness.   She received pneumonia vaccine yesterday.  Pain is not severe.  Injection site was deltoid.  Redness and swelling down to elbow.   Review of Systems    negative for fever  Objective:   Physical Exam   Constitutional: Appears well-developed and well-nourished. No distress.  Cardiovascular: Normal rate, regular rhythm and normal heart sounds.  Exam reveals no gallop and no friction rub.   No murmur heard. Pulmonary/Chest: Effort normal and breath sounds normal.  No wheezes. No rales.  Skin: left upper arm with mild swelling and redness.  Mildly tender Psychiatric: Normal mood and affect. Behavior is normal.         Assessment & Plan:

## 2011-02-09 NOTE — Patient Instructions (Signed)
Use cool compress to left upper arm 2-3 x per day Take ibuprofen as needed. Call our office if your left arm symptoms get worse

## 2011-02-26 ENCOUNTER — Encounter: Payer: Self-pay | Admitting: Internal Medicine

## 2011-02-26 ENCOUNTER — Ambulatory Visit (AMBULATORY_SURGERY_CENTER): Payer: Medicare Other | Admitting: Internal Medicine

## 2011-02-26 VITALS — BP 111/63 | HR 60 | Temp 96.8°F | Resp 18 | Ht 64.0 in | Wt 145.0 lb

## 2011-02-26 DIAGNOSIS — D126 Benign neoplasm of colon, unspecified: Secondary | ICD-10-CM

## 2011-02-26 DIAGNOSIS — Z86010 Personal history of colonic polyps: Secondary | ICD-10-CM

## 2011-02-26 DIAGNOSIS — Z800 Family history of malignant neoplasm of digestive organs: Secondary | ICD-10-CM

## 2011-02-26 DIAGNOSIS — Z1211 Encounter for screening for malignant neoplasm of colon: Secondary | ICD-10-CM

## 2011-02-26 MED ORDER — SODIUM CHLORIDE 0.9 % IV SOLN: 500.0000 mL | INTRAVENOUS | Status: DC

## 2011-02-26 NOTE — Patient Instructions (Signed)
Discharge instructions given with verbal understanding. Handout on polyps given. Resume previous medications. 

## 2011-02-26 NOTE — Progress Notes (Signed)
DR. Marina Goodell Made aware b/p of during entire procedure. Pt heart rate remained in the 60's, skin warm and dry and pink moaning, but no more medication administered encouraged slow deep breathing,physician reached cecum b/p increased to 91/49, infusing continously.

## 2011-03-01 ENCOUNTER — Telehealth: Payer: Self-pay | Admitting: *Deleted

## 2011-03-01 NOTE — Telephone Encounter (Signed)

## 2011-05-24 DIAGNOSIS — H16229 Keratoconjunctivitis sicca, not specified as Sjogren's, unspecified eye: Secondary | ICD-10-CM | POA: Diagnosis not present

## 2011-05-24 DIAGNOSIS — H25019 Cortical age-related cataract, unspecified eye: Secondary | ICD-10-CM | POA: Diagnosis not present

## 2011-05-24 DIAGNOSIS — H40009 Preglaucoma, unspecified, unspecified eye: Secondary | ICD-10-CM | POA: Diagnosis not present

## 2011-06-23 DIAGNOSIS — M159 Polyosteoarthritis, unspecified: Secondary | ICD-10-CM | POA: Diagnosis not present

## 2011-06-23 DIAGNOSIS — M19049 Primary osteoarthritis, unspecified hand: Secondary | ICD-10-CM | POA: Diagnosis not present

## 2011-07-29 DIAGNOSIS — B351 Tinea unguium: Secondary | ICD-10-CM | POA: Diagnosis not present

## 2011-07-29 DIAGNOSIS — L659 Nonscarring hair loss, unspecified: Secondary | ICD-10-CM | POA: Diagnosis not present

## 2011-08-07 DIAGNOSIS — J069 Acute upper respiratory infection, unspecified: Secondary | ICD-10-CM | POA: Diagnosis not present

## 2011-08-10 ENCOUNTER — Encounter: Payer: Self-pay | Admitting: Internal Medicine

## 2011-08-10 ENCOUNTER — Ambulatory Visit (INDEPENDENT_AMBULATORY_CARE_PROVIDER_SITE_OTHER): Payer: Medicare Other | Admitting: Internal Medicine

## 2011-08-10 VITALS — BP 102/74 | HR 101 | Temp 98.2°F | Wt 151.0 lb

## 2011-08-10 DIAGNOSIS — J988 Other specified respiratory disorders: Secondary | ICD-10-CM

## 2011-08-10 DIAGNOSIS — Z79899 Other long term (current) drug therapy: Secondary | ICD-10-CM

## 2011-08-10 DIAGNOSIS — J449 Chronic obstructive pulmonary disease, unspecified: Secondary | ICD-10-CM | POA: Diagnosis not present

## 2011-08-10 DIAGNOSIS — J22 Unspecified acute lower respiratory infection: Secondary | ICD-10-CM

## 2011-08-10 DIAGNOSIS — M154 Erosive (osteo)arthritis: Secondary | ICD-10-CM

## 2011-08-10 DIAGNOSIS — J329 Chronic sinusitis, unspecified: Secondary | ICD-10-CM

## 2011-08-10 MED ORDER — HYDROCODONE-HOMATROPINE 5-1.5 MG/5ML PO SYRP: 5.0000 mL | ORAL_SOLUTION | ORAL | Status: AC | PRN

## 2011-08-10 MED ORDER — AMOXICILLIN 875 MG PO TABS: 875.0000 mg | ORAL_TABLET | Freq: Three times a day (TID) | ORAL | Status: AC

## 2011-08-10 NOTE — Patient Instructions (Signed)
This still may be a viral respiratory infection that needs more time to improve. However it is okay to try the cough medicine for comfort at night. It may not cause vomiting like the codeine does not if it causes her problem did not take it. Call us and let us know.   If you have continued Face  and teeth pressure that is not improved over the next few days or after the weekend you're not significantly improved you can add the antibiotic for possible bacterial sinusitis.  Contact us with any alarm features such as fever chills shortness of breath.  Cough may last another week or 2 but you should be improving. If you're not follow up with Dr. Cato Mulligan.

## 2011-08-10 NOTE — Progress Notes (Signed)
  Subjective:    Patient ID: Destiny Acosta, female    DOB: July 30, 1945, 66 y.o.   MRN: [id number]  HPI Patient comes in today for SDA for  new problem evaluation.  She has had 5 days of upper respiratory symptoms which includes cough congestion facial pressure right frontal headache hoarseness and feeling sicker than usual. She was seen in the [facility] although we can't 4 days ago but it only been sick a couple days and when told it was a virus and would probably work itself out. At that time she had no thickened congestion.    however since that time she is now coughing up some thick mucus that is green yellow mostly from postnasal drainage but possibly from chest. She denies shortness of breath. She states that her teeth hurt and sometimes with pressure.   She has tried many over-the-counter's including Afrin and Coricidin and other antihistamines. She asked for some type of relief and assessment.  She took codeine in the past when she was younger and had vomiting she has not been on hydrocodone and doesn't know she will vomit with that. No one else is sick at home.  She has no known underlying lung disease or COPD. 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  Review of Systems  negative for chest pain syncope fever but does feel achy no UTI GI GU symptoms that are new. She has a history of hypertension and aortic stenosis.   Past history family history social history reviewed in the electronic medical record.     Objective:   Physical Exam WDWN in NAD  quiet respirations; mildly congested  somewhat hoarse. Non toxic . HEENT: Normocephalic ;atraumatic , Eyes;  PERRL, EOMs  Full, lids and conjunctiva clear,,Ears: no deformities, canals nl, TM landmarks normal, Nose: no deformity or discharge but congested;face mildly tender ar bifrontal areatender Mouth : OP clear without lesion or edema . Neck: Supple without adenopathy or masses or bruits Chest:   Clear to A&P without wheezes rales or rhonchi CV:  S1-S2 no gallops or murmurs peripheral perfusion is normal Skin :nl perfusion and no acute rashes      Assessment & Plan:    acute respiratory infection with sinusitis and coughing  The still appears to be a viral respiratory infection although she does have some localizing symptoms and is uncomfortable. It is hard to tell if she has had these problems before.  She can try the hydrocodone cough medicine although it could cause GI side effects. Increasing hot tea and liquids etc. If she is having continued facial pressure she can add on an antibiotic for possible bacterial sinusitis but it appears to be viral at this point.  Prescription printed; instructions given  Risk benefit of medication discussed.

## 2011-08-11 ENCOUNTER — Telehealth: Payer: Self-pay

## 2011-08-11 NOTE — Telephone Encounter (Signed)
Pt states she was seen on 08/10/11 by Dr. Fabian Sharp and has not developed fluid in her ears.  Pt would like to know if this will clear up on its own or if this needs special treatment or medication.  Pls advise.

## 2011-08-11 NOTE — Telephone Encounter (Signed)
No sure i understand the Question  Can get decrease hearing with uri    hearing usually gets back to normal in a few weeks.

## 2011-08-12 DIAGNOSIS — B359 Dermatophytosis, unspecified: Secondary | ICD-10-CM | POA: Diagnosis not present

## 2011-08-12 DIAGNOSIS — L820 Inflamed seborrheic keratosis: Secondary | ICD-10-CM | POA: Diagnosis not present

## 2011-08-12 DIAGNOSIS — L259 Unspecified contact dermatitis, unspecified cause: Secondary | ICD-10-CM | POA: Diagnosis not present

## 2011-08-12 NOTE — Telephone Encounter (Signed)
Left a message for pt to return call 

## 2011-08-13 NOTE — Telephone Encounter (Signed)
Pt aware.

## 2011-08-18 ENCOUNTER — Telehealth: Payer: Self-pay | Admitting: Internal Medicine

## 2011-08-18 NOTE — Telephone Encounter (Signed)
It appears that on 02/26/2011 pt was seen by Dr. Marina Goodell for a colonoscopy and the infusion was given then.  Called to make pt aware.

## 2011-08-18 NOTE — Telephone Encounter (Signed)
Pt called and said that she called on 08/16/11 and was transferred to nurse vm. Pt said that she was in for ov to see Dr Fabian Sharp on 08/10/11 and has questions regarding items that were on her check on sheet. Pt said that it was showing infusions in veins and meds that were administered, that pt says did not occur during ov. Pls call.

## 2011-08-24 ENCOUNTER — Ambulatory Visit: Payer: Medicare Other | Admitting: Family

## 2011-08-24 ENCOUNTER — Telehealth: Payer: Self-pay | Admitting: Internal Medicine

## 2011-08-24 NOTE — Telephone Encounter (Signed)
Pt called and said that she is still coughing at night and is req a refill of HYDROcodone-homatropine (HYCODAN) 5-1.5 MG/5ML syrup, that was prescribed by Dr Fabian Sharp. Pls call in to [facility] [address].

## 2011-08-25 MED ORDER — HYDROCODONE-HOMATROPINE 5-1.5 MG/5ML PO SYRP: 5.0000 mL | ORAL_SOLUTION | Freq: Three times a day (TID) | ORAL | Status: AC | PRN

## 2011-08-25 NOTE — Telephone Encounter (Signed)
Okay x1 

## 2011-08-25 NOTE — Telephone Encounter (Signed)
Pt notified and confirms she is NOT allergic to this medication.

## 2011-09-13 ENCOUNTER — Other Ambulatory Visit: Payer: Self-pay | Admitting: *Deleted

## 2011-09-13 MED ORDER — LISINOPRIL 20 MG PO TABS: 20.0000 mg | ORAL_TABLET | Freq: Every day | ORAL | Status: DC

## 2011-10-01 DIAGNOSIS — L659 Nonscarring hair loss, unspecified: Secondary | ICD-10-CM | POA: Diagnosis not present

## 2011-10-01 DIAGNOSIS — B351 Tinea unguium: Secondary | ICD-10-CM | POA: Diagnosis not present

## 2011-10-22 DIAGNOSIS — Z1231 Encounter for screening mammogram for malignant neoplasm of breast: Secondary | ICD-10-CM | POA: Diagnosis not present

## 2011-10-22 DIAGNOSIS — Z1289 Encounter for screening for malignant neoplasm of other sites: Secondary | ICD-10-CM | POA: Diagnosis not present

## 2011-11-03 DIAGNOSIS — H16229 Keratoconjunctivitis sicca, not specified as Sjogren's, unspecified eye: Secondary | ICD-10-CM | POA: Diagnosis not present

## 2011-11-03 DIAGNOSIS — H25019 Cortical age-related cataract, unspecified eye: Secondary | ICD-10-CM | POA: Diagnosis not present

## 2011-11-03 DIAGNOSIS — H40009 Preglaucoma, unspecified, unspecified eye: Secondary | ICD-10-CM | POA: Diagnosis not present

## 2011-11-05 DIAGNOSIS — M19049 Primary osteoarthritis, unspecified hand: Secondary | ICD-10-CM | POA: Diagnosis not present

## 2011-11-05 DIAGNOSIS — M159 Polyosteoarthritis, unspecified: Secondary | ICD-10-CM | POA: Diagnosis not present

## 2011-11-12 ENCOUNTER — Encounter: Payer: Self-pay | Admitting: Internal Medicine

## 2011-11-12 ENCOUNTER — Ambulatory Visit (INDEPENDENT_AMBULATORY_CARE_PROVIDER_SITE_OTHER): Payer: Medicare Other | Admitting: Internal Medicine

## 2011-11-12 VITALS — BP 112/80 | Temp 98.1°F | Wt 155.0 lb

## 2011-11-12 DIAGNOSIS — M542 Cervicalgia: Secondary | ICD-10-CM

## 2011-11-12 DIAGNOSIS — M19049 Primary osteoarthritis, unspecified hand: Secondary | ICD-10-CM | POA: Diagnosis not present

## 2011-11-12 DIAGNOSIS — M154 Erosive (osteo)arthritis: Secondary | ICD-10-CM

## 2011-11-12 DIAGNOSIS — I10 Essential (primary) hypertension: Secondary | ICD-10-CM

## 2011-11-12 NOTE — Progress Notes (Signed)
  Subjective:    Patient ID: Destiny Acosta, female    DOB: [birth year], 66 y.o.   MRN: [id number]  HPI  66 year old patient who is seen today for followup. She is followed by rheumatology at [facility] for erosive osteoarthritis involving primarily the hands. She also has a long history of neck pain and this is her chief the concern today. Pain is in the left posterior neck region.    Review of Systems  Constitutional: Negative.   HENT: Negative for hearing loss, congestion, sore throat, rhinorrhea, dental problem, sinus pressure and tinnitus.   Eyes: Negative for pain, discharge and visual disturbance.  Respiratory: Negative for cough and shortness of breath.   Cardiovascular: Negative for chest pain, palpitations and leg swelling.  Gastrointestinal: Negative for nausea, vomiting, abdominal pain, diarrhea, constipation, blood in stool and abdominal distention.  Genitourinary: Negative for dysuria, urgency, frequency, hematuria, flank pain, vaginal bleeding, vaginal discharge, difficulty urinating, vaginal pain and pelvic pain.  Musculoskeletal: Positive for back pain (neck pain). Negative for joint swelling, arthralgias and gait problem.  Skin: Negative for rash.  Neurological: Negative for dizziness, syncope, speech difficulty, weakness, numbness and headaches.  Hematological: Negative for adenopathy.  Psychiatric/Behavioral: Negative for behavioral problems, dysphoric mood and agitation. The patient is not nervous/anxious.        Objective:   Physical Exam  Constitutional: She is oriented to person, place, and time. She appears well-developed and well-nourished.  HENT:  Head: Normocephalic.  Right Ear: External ear normal.  Left Ear: External ear normal.  Mouth/Throat: Oropharynx is clear and moist.  Eyes: Conjunctivae and EOM are normal. Pupils are equal, round, and reactive to light.  Neck: Normal range of motion. Neck supple. No thyromegaly present.       Fairly full range  of motion of the neck with somewhat to the stiff and uncomfortable; the posterior neck musculature as well as trapezius musculature were tight and tense  Cardiovascular: Normal rate, regular rhythm, normal heart sounds and intact distal pulses.   Pulmonary/Chest: Effort normal and breath sounds normal.  Abdominal: Soft. Bowel sounds are normal. She exhibits no mass. There is no tenderness.  Musculoskeletal: Normal range of motion.  Lymphadenopathy:    She has no cervical adenopathy.  Neurological: She is alert and oriented to person, place, and time.  Skin: Skin is warm and dry. No rash noted.  Psychiatric: She has a normal mood and affect. Her behavior is normal.          Assessment & Plan:  Neck pain osteoarthritis   We'll set up for physical therapy. Continue Aleve twice daily. She does have a prescription for tramadol to use when necessary

## 2011-11-12 NOTE — Patient Instructions (Signed)
    Take Aleve 200 mg twice daily for pain or swelling  Physical therapy referral as discussed

## 2011-11-24 ENCOUNTER — Ambulatory Visit: Payer: Medicare Other | Attending: Internal Medicine

## 2011-11-24 DIAGNOSIS — R293 Abnormal posture: Secondary | ICD-10-CM | POA: Insufficient documentation

## 2011-11-24 DIAGNOSIS — R5381 Other malaise: Secondary | ICD-10-CM | POA: Diagnosis not present

## 2011-11-24 DIAGNOSIS — M542 Cervicalgia: Secondary | ICD-10-CM | POA: Diagnosis not present

## 2011-11-24 DIAGNOSIS — IMO0001 Reserved for inherently not codable concepts without codable children: Secondary | ICD-10-CM | POA: Diagnosis not present

## 2011-11-30 ENCOUNTER — Ambulatory Visit: Payer: Medicare Other

## 2011-12-01 ENCOUNTER — Ambulatory Visit: Payer: Medicare Other | Admitting: Internal Medicine

## 2011-12-03 DIAGNOSIS — H16229 Keratoconjunctivitis sicca, not specified as Sjogren's, unspecified eye: Secondary | ICD-10-CM | POA: Diagnosis not present

## 2011-12-03 DIAGNOSIS — H526 Other disorders of refraction: Secondary | ICD-10-CM | POA: Diagnosis not present

## 2011-12-03 DIAGNOSIS — H40009 Preglaucoma, unspecified, unspecified eye: Secondary | ICD-10-CM | POA: Diagnosis not present

## 2011-12-03 DIAGNOSIS — H25019 Cortical age-related cataract, unspecified eye: Secondary | ICD-10-CM | POA: Diagnosis not present

## 2012-01-03 DIAGNOSIS — L570 Actinic keratosis: Secondary | ICD-10-CM | POA: Diagnosis not present

## 2012-01-03 DIAGNOSIS — L659 Nonscarring hair loss, unspecified: Secondary | ICD-10-CM | POA: Diagnosis not present

## 2012-01-03 DIAGNOSIS — B351 Tinea unguium: Secondary | ICD-10-CM | POA: Diagnosis not present

## 2012-01-03 DIAGNOSIS — D485 Neoplasm of uncertain behavior of skin: Secondary | ICD-10-CM | POA: Diagnosis not present

## 2012-01-07 DIAGNOSIS — L570 Actinic keratosis: Secondary | ICD-10-CM | POA: Diagnosis not present

## 2012-02-01 ENCOUNTER — Other Ambulatory Visit: Payer: Self-pay | Admitting: Obstetrics and Gynecology

## 2012-02-01 DIAGNOSIS — N6310 Unspecified lump in the right breast, unspecified quadrant: Secondary | ICD-10-CM

## 2012-02-01 DIAGNOSIS — N63 Unspecified lump in unspecified breast: Secondary | ICD-10-CM | POA: Diagnosis not present

## 2012-02-03 ENCOUNTER — Ambulatory Visit
Admission: RE | Admit: 2012-02-03 | Discharge: 2012-02-03 | Disposition: A | Discharge status: Home / Self Care | Payer: Medicare Other | Source: Ambulatory Visit | Attending: Obstetrics and Gynecology | Admitting: Obstetrics and Gynecology

## 2012-02-03 ENCOUNTER — Other Ambulatory Visit: Payer: Self-pay | Admitting: Obstetrics and Gynecology

## 2012-02-03 DIAGNOSIS — N6310 Unspecified lump in the right breast, unspecified quadrant: Secondary | ICD-10-CM

## 2012-02-03 DIAGNOSIS — N63 Unspecified lump in unspecified breast: Secondary | ICD-10-CM | POA: Diagnosis not present

## 2012-02-04 DIAGNOSIS — M19049 Primary osteoarthritis, unspecified hand: Secondary | ICD-10-CM | POA: Diagnosis not present

## 2012-02-08 ENCOUNTER — Ambulatory Visit (INDEPENDENT_AMBULATORY_CARE_PROVIDER_SITE_OTHER): Payer: Medicare Other

## 2012-02-08 DIAGNOSIS — Z23 Encounter for immunization: Secondary | ICD-10-CM

## 2012-02-25 ENCOUNTER — Ambulatory Visit
Admission: RE | Admit: 2012-02-25 | Discharge: 2012-02-25 | Disposition: A | Discharge status: Home / Self Care | Payer: No Typology Code available for payment source | Source: Ambulatory Visit | Attending: Obstetrics and Gynecology | Admitting: Obstetrics and Gynecology

## 2012-02-25 DIAGNOSIS — R928 Other abnormal and inconclusive findings on diagnostic imaging of breast: Secondary | ICD-10-CM | POA: Diagnosis not present

## 2012-02-25 DIAGNOSIS — N6310 Unspecified lump in the right breast, unspecified quadrant: Secondary | ICD-10-CM

## 2012-02-27 ENCOUNTER — Encounter (HOSPITAL_BASED_OUTPATIENT_CLINIC_OR_DEPARTMENT_OTHER): Payer: Self-pay | Admitting: *Deleted

## 2012-02-27 ENCOUNTER — Emergency Department (HOSPITAL_BASED_OUTPATIENT_CLINIC_OR_DEPARTMENT_OTHER)
Admission: EM | Admit: 2012-02-27 | Discharge: 2012-02-27 | Disposition: A | Discharge status: Home / Self Care | Payer: Medicare Other | Attending: Emergency Medicine | Admitting: Emergency Medicine

## 2012-02-27 DIAGNOSIS — N390 Urinary tract infection, site not specified: Secondary | ICD-10-CM | POA: Diagnosis not present

## 2012-02-27 DIAGNOSIS — I10 Essential (primary) hypertension: Secondary | ICD-10-CM | POA: Insufficient documentation

## 2012-02-27 DIAGNOSIS — K219 Gastro-esophageal reflux disease without esophagitis: Secondary | ICD-10-CM | POA: Diagnosis not present

## 2012-02-27 DIAGNOSIS — E785 Hyperlipidemia, unspecified: Secondary | ICD-10-CM | POA: Insufficient documentation

## 2012-02-27 DIAGNOSIS — I059 Rheumatic mitral valve disease, unspecified: Secondary | ICD-10-CM | POA: Insufficient documentation

## 2012-02-27 LAB — URINE MICROSCOPIC-ADD ON

## 2012-02-27 LAB — URINALYSIS, ROUTINE W REFLEX MICROSCOPIC
Glucose, UA: NEGATIVE mg/dL
pH: 6.5 (ref 5.0–8.0)

## 2012-02-27 MED ORDER — CIPROFLOXACIN HCL 500 MG PO TABS
500.0000 mg | ORAL_TABLET | Freq: Once | ORAL | Status: AC
  Administered 2012-02-27: 500 mg via ORAL
  Filled 2012-02-27: qty 1

## 2012-02-27 MED ORDER — CIPROFLOXACIN HCL 250 MG PO TABS: 250.0000 mg | ORAL_TABLET | Freq: Two times a day (BID) | ORAL | Status: DC

## 2012-02-27 NOTE — ED Provider Notes (Signed)
History  This chart was scribed for Hanley Seamen, MD by Shari Heritage. The patient was seen in room MH01/MH01. Patient's care was started at 2320.     CSN: [id number]  Arrival date & time 02/27/12  2247   First MD Initiated Contact with Patient 02/27/12 2320      Chief Complaint  Patient presents with  . Dysuria     The history is provided by the patient. No language interpreter was used.    Destiny Acosta is a 66 y.o. female who presents to the Emergency Department complaining of mild, persistent, pressure-like dysuria onset yesterday. Patient also states that she is experiencing mild suprapubic discomfort.  Patient took a azo test and tested positive for WBC twice prompting her to come to the ED. Patient denies nausea vomiting, back pain, fever and chills.  Patient has a history of UTI,  HTN and heart murmur.    Past Medical History  Diagnosis Date  . HYPERLIPIDEMIA 12/06/2006  . HYPERTENSION 12/06/2006  . MITRAL VALVE PROLAPSE 12/06/2006  . GERD 12/06/2006  . Aortal stenosis 05/14/2010  . Allergy     Eggplant/KIWI  . Heart murmur     aortic valve  . Hyperplastic colon polyp     Past Surgical History  Procedure Date  . Tubal ligation     Family History  Problem Relation Age of Onset  . Colon cancer Father   . Atrial fibrillation Father   . Diabetes Mother   . Heart failure Mother   . Heart disease Mother 59    CABG  . Diabetes Maternal Grandmother   . Arthritis Sister   . Diabetes Sister     pre-diabetic    History  Substance Use Topics  . Smoking status: Never Smoker   . Smokeless tobacco: Never Used  . Alcohol Use: Yes     rarely may have 1 wine a month or less    OB History    Grav Para Term Preterm Abortions TAB SAB Ect Mult Living                  Review of Systems A complete 10 system review of systems was obtained and all systems are negative except as noted in the HPI and PMH.   Allergies  Codeine phosphate  Home Medications   Current  Outpatient Rx  Name Route Sig Dispense Refill  . ESTRACE VA Vaginal Place vaginally.    Marland Kitchen FINASTERIDE 5 MG PO TABS Oral Take 2.5 mg by mouth daily.    Marland Kitchen CALCIUM CARBONATE-VITAMIN D 600-400 MG-UNIT PO TABS Oral Take 1 tablet by mouth daily.      Marland Kitchen CIPROFLOXACIN HCL 250 MG PO TABS Oral Take 1 tablet (250 mg total) by mouth every 12 (twelve) hours. 6 tablet 0  . DIGESTIVE ENZYMES PO Oral Take 1 tablet by mouth 3 (three) times daily before meals.      Marland Kitchen DOXYLAMINE SUCCINATE (SLEEP) 25 MG PO TABS Oral Take 25 mg by mouth at bedtime as needed.      Marland Kitchen ESTRADIOL 2 MG VA RING Vaginal Place 2 mg vaginally every 3 (three) months. follow package directions     . FEXOFENADINE HCL 180 MG PO TABS Oral Take 180 mg by mouth daily.      Marland Kitchen GLUCOSAMINE-CHONDROITIN 500-400 MG PO TABS Oral Take 1 tablet by mouth 3 (three) times daily.      Marland Kitchen HYDROXYCHLOROQUINE SULFATE 200 MG PO TABS Oral Take 200 mg by mouth 2 (  two) times daily.    . IBUPROFEN 200 MG PO TABS Oral Take 200 mg by mouth as needed. pain     . LISINOPRIL 20 MG PO TABS Oral Take 1 tablet (20 mg total) by mouth daily. 90 tablet 1  . MAGNESIUM 250 MG PO TABS Oral Take 1 tablet by mouth 2 (two) times daily.      . MULTIVITAMINS PO CAPS Oral Take 1 capsule by mouth daily.      Marland Kitchen NAPROXEN SODIUM 220 MG PO TABS Oral Take 220 mg by mouth as needed. pain     . TERBINAFINE HCL 250 MG PO TABS Oral Take 250 mg by mouth daily.    . TRETINOIN 0.1 % EX CREA Topical Apply topically at bedtime.        BP 148/73  Pulse 70  Temp 97.5 F (36.4 C) (Oral)  Resp 18  Ht 5\' 6"  (1.676 m)  Wt 140 lb (63.504 kg)  BMI 22.60 kg/m2  SpO2 100%  Physical Exam  Nursing note and vitals reviewed. Constitutional: She is oriented to person, place, and time. She appears well-developed and well-nourished.  HENT:  Head: Normocephalic and atraumatic.  Eyes: EOM are normal. Pupils are equal, round, and reactive to light.  Cardiovascular: Normal rate and regular rhythm.   No murmur  heard. Pulmonary/Chest: Effort normal and breath sounds normal. No respiratory distress. She has no wheezes. She has no rales.  Abdominal: Soft. Bowel sounds are normal. She exhibits no distension. There is no tenderness. There is no rebound, no guarding and no CVA tenderness.       Mild suprapubic discomfort to palpation.  Genitourinary:       No CVA tenderness.   Musculoskeletal: Normal range of motion.  Neurological: She is alert and oriented to person, place, and time.  Skin: Skin is warm and dry.  Psychiatric: She has a normal mood and affect. Her behavior is normal.    ED Course  Procedures (including critical care time) DIAGNOSTIC STUDIES: Oxygen Saturation is 100% on room air, normal by my interpretation.    COORDINATION OF CARE: 11:30pm- Patient informed of current plan for treatment and evaluation and agrees with plan at this time.    1. UTI (urinary tract infection)       MDM   Nursing notes and vitals signs, including pulse oximetry, reviewed.  Summary of this visit's results, reviewed by myself:  Labs:  Results for orders placed during the hospital encounter of 02/27/12  URINALYSIS, ROUTINE W REFLEX MICROSCOPIC      Component Value Range   Color, Urine YELLOW  YELLOW   APPearance CLEAR  CLEAR   Specific Gravity, Urine 1.006  1.005 - 1.030   pH 6.5  5.0 - 8.0   Glucose, UA NEGATIVE  NEGATIVE mg/dL   Hgb urine dipstick NEGATIVE  NEGATIVE   Bilirubin Urine NEGATIVE  NEGATIVE   Ketones, ur NEGATIVE  NEGATIVE mg/dL   Protein, ur NEGATIVE  NEGATIVE mg/dL   Urobilinogen, UA 0.2  0.0 - 1.0 mg/dL   Nitrite NEGATIVE  NEGATIVE   Leukocytes, UA TRACE (*) NEGATIVE  URINE MICROSCOPIC-ADD ON      Component Value Range   Squamous Epithelial / LPF RARE  RARE   WBC, UA 3-6  <3 WBC/hpf   RBC / HPF 0-2  <3 RBC/hpf   Bacteria, UA RARE  RARE   Urinalysis is borderline positive for urinary tract infection. In light of the patient's symptoms and history of rapid  progression  to more severe infection we will go ahead and treat.     I personally performed the services described in this documentation, which was scribed in my presence.  The recorded information has been reviewed and considered.     Hanley Seamen, MD 02/27/12 [phone]

## 2012-02-27 NOTE — ED Notes (Signed)
Pressure with urination. Hx UTI. Azo test + x 2.

## 2012-02-29 ENCOUNTER — Telehealth (HOSPITAL_BASED_OUTPATIENT_CLINIC_OR_DEPARTMENT_OTHER): Payer: Self-pay | Admitting: *Deleted

## 2012-02-29 NOTE — ED Notes (Signed)
Patient called to state she was having joint pain from the cipro medication we placed her on on 02/27/12.  Urine culture report pulled and reviewed with Dr. Silverio Lay.  Orders received to call patient and instruct her to stop the medication due to a negative culture report.  Call placed to patient with following instructions, verbalized understanding.

## 2012-03-13 ENCOUNTER — Other Ambulatory Visit: Payer: Self-pay | Admitting: *Deleted

## 2012-03-13 MED ORDER — LISINOPRIL 20 MG PO TABS: 20.0000 mg | ORAL_TABLET | Freq: Every day | ORAL | Status: DC

## 2012-03-15 DIAGNOSIS — M1710 Unilateral primary osteoarthritis, unspecified knee: Secondary | ICD-10-CM | POA: Diagnosis not present

## 2012-03-21 DIAGNOSIS — D261 Other benign neoplasm of corpus uteri: Secondary | ICD-10-CM | POA: Diagnosis not present

## 2012-03-21 DIAGNOSIS — N950 Postmenopausal bleeding: Secondary | ICD-10-CM | POA: Diagnosis not present

## 2012-03-28 DIAGNOSIS — M19049 Primary osteoarthritis, unspecified hand: Secondary | ICD-10-CM | POA: Diagnosis not present

## 2012-04-04 DIAGNOSIS — N950 Postmenopausal bleeding: Secondary | ICD-10-CM | POA: Diagnosis not present

## 2012-05-08 ENCOUNTER — Ambulatory Visit (INDEPENDENT_AMBULATORY_CARE_PROVIDER_SITE_OTHER): Payer: Medicare Other | Admitting: Family Medicine

## 2012-05-08 ENCOUNTER — Encounter: Payer: Self-pay | Admitting: Family Medicine

## 2012-05-08 VITALS — BP 102/80 | HR 88 | Temp 98.1°F | Wt 154.0 lb

## 2012-05-08 DIAGNOSIS — R197 Diarrhea, unspecified: Secondary | ICD-10-CM

## 2012-05-08 NOTE — Progress Notes (Signed)
Chief Complaint  Patient presents with  . digestive issues    diarreha, cramping, excessive gas     HPI:  Acute visit for digestive symptoms: -started: 2 weeks ago - then better then worse again 4 days ago -symptoms: diarrhea mild initially, then worse 4 days ago for several days then better, gas, bloating, malaise, anorexia -eats a lot of yogurt, on probiotic in the past -denies: fevers, vomiting, flood in stool -has tried: imodium helps -hx of GERD, colon polyp, bloating and diarrhea in the past - has had issues with constipation in the past and wonders if IBS, had antibiotics several times this year for UTI - last one 3 months ago, no recent travel -had colonoscopy 6 months ago  ROS: See pertinent positives and negatives per HPI.  Past Medical History  Diagnosis Date  . HYPERLIPIDEMIA 12/06/2006  . HYPERTENSION 12/06/2006  . MITRAL VALVE PROLAPSE 12/06/2006  . GERD 12/06/2006  . Aortal stenosis 05/14/2010  . Allergy     Eggplant/KIWI  . Heart murmur     aortic valve  . Hyperplastic colon polyp     Family History  Problem Relation Age of Onset  . Colon cancer Father   . Atrial fibrillation Father   . Diabetes Mother   . Heart failure Mother   . Heart disease Mother 69    CABG  . Diabetes Maternal Grandmother   . Arthritis Sister   . Diabetes Sister     pre-diabetic    History   Social History  . Marital Status: Married    Spouse Name: N/A    Number of Children: N/A  . Years of Education: N/A   Social History Main Topics  . Smoking status: Never Smoker   . Smokeless tobacco: Never Used  . Alcohol Use: Yes     Comment: rarely may have 1 wine a month or less  . Drug Use: No  . Sexually Active: None   Other Topics Concern  . None   Social History Narrative  . None    Current outpatient prescriptions:Calcium Carbonate-Vitamin D (CALTRATE 600+D) 600-400 MG-UNIT per tablet, Take 1 tablet by mouth daily.  , Disp: , Rfl: ;  ciprofloxacin (CIPRO) 250 MG tablet,  Take 1 tablet (250 mg total) by mouth every 12 (twelve) hours., Disp: 6 tablet, Rfl: 0;  DIGESTIVE ENZYMES PO, Take 1 tablet by mouth 3 (three) times daily before meals.  , Disp: , Rfl:  Doxylamine Succinate, Sleep, (UNISOM) 25 MG tablet, Take 25 mg by mouth at bedtime as needed.  , Disp: , Rfl: ;  Estradiol (ESTRACE VA), Place vaginally., Disp: , Rfl: ;  estradiol (ESTRING) 2 MG vaginal ring, Place 2 mg vaginally every 3 (three) months. follow package directions , Disp: , Rfl: ;  fexofenadine (ALLEGRA) 180 MG tablet, Take 180 mg by mouth daily.  , Disp: , Rfl:  finasteride (PROSCAR) 5 MG tablet, Take 2.5 mg by mouth daily., Disp: , Rfl: ;  glucosamine-chondroitin 500-400 MG tablet, Take 1 tablet by mouth 3 (three) times daily.  , Disp: , Rfl: ;  hydroxychloroquine (PLAQUENIL) 200 MG tablet, Take 200 mg by mouth 2 (two) times daily., Disp: , Rfl: ;  ibuprofen (ADVIL,MOTRIN) 200 MG tablet, Take 200 mg by mouth as needed. pain , Disp: , Rfl:  lisinopril (PRINIVIL,ZESTRIL) 20 MG tablet, Take 1 tablet (20 mg total) by mouth daily., Disp: 90 tablet, Rfl: 0;  Magnesium 250 MG TABS, Take 1 tablet by mouth 2 (two) times daily.  ,  Disp: , Rfl: ;  Multiple Vitamin (MULTIVITAMIN) capsule, Take 1 capsule by mouth daily.  , Disp: , Rfl: ;  naproxen sodium (ANAPROX) 220 MG tablet, Take 220 mg by mouth as needed. pain , Disp: , Rfl:  terbinafine (LAMISIL) 250 MG tablet, Take 250 mg by mouth daily., Disp: , Rfl: ;  tretinoin (RETIN-A) 0.1 % cream, Apply topically at bedtime.  , Disp: , Rfl:  Current facility-administered medications:0.9 %  sodium chloride infusion, 500 mL, Intravenous, Continuous, Hilarie Fredrickson, MD  [facility] Vitals:   05/08/12 1455  BP: 102/80  Pulse: 88  Temp: 98.1 F (36.7 C)    There is no height on file to calculate BMI.  GENERAL: vitals reviewed and listed above, alert, oriented, appears well hydrated and in no acute distress  HEENT: atraumatic, conjunttiva clear, no obvious  abnormalities on inspection of external nose and ears  NECK: no obvious masses on inspection  LUNGS: clear to auscultation bilaterally, no wheezes, rales or rhonchi, good air movement  CV: HRRR, no peripheral edema  ABD: BS+, soft, NTTP  MS: moves all extremities without noticeable abnormality  PSYCH: pleasant and cooperative, no obvious depression or anxiety  ASSESSMENT AND PLAN:  Discussed the following assessment and plan:  1. Diarrhea    -likely gastroenteritis, benign exam with no alarm features - possible IBS given long hx of intermittent GI symptoms, sometimes constipation and sometimes loose stools.  -follow up with PCP if not resolving  -Patient advised to return or notify a doctor immediately if symptoms worsen or persist or new concerns arise.  Patient Instructions   FOR DIARRHEA and Gas: -can use Imodium ( loperamide) and/or gas-x (simethicone) for your symptoms as needed according to instructions on the packaging   AVOID DAIRY PRODUCTS FOR NEXT 1-2 WEEKS UNTIL BETTER  Avoid foods that are high in fat or oils. Some examples MVH:QIONG cream, butter, frankfurters, sausage, and other fatty meats.  Avoid foods that have a laxative effect, such as fruit, fruit juice, and dairy products.  Cut out carbonated drinks, chewing gum, and "gassy" foods, such as beans and cabbage. This may help relieve bloating and belching.  Keep track of what foods seem to trigger your symptoms.  Avoid emotionally charged situations or circumstances that produce anxiety.  Start or continue exercising.  Get plenty of rest and sleep.  Follow up with your doctor in 2-4 weeks if not resolved or sooner if worsening.       Kriste Basque R.

## 2012-05-08 NOTE — Patient Instructions (Addendum)
  FOR DIARRHEA and Gas: -can use Imodium ( loperamide) and/or gas-x (simethicone) for your symptoms as needed according to instructions on the packaging   AVOID DAIRY PRODUCTS FOR NEXT 1-2 WEEKS UNTIL BETTER  Avoid foods that are high in fat or oils. Some examples ZOX:WRUEA cream, butter, frankfurters, sausage, and other fatty meats.  Avoid foods that have a laxative effect, such as fruit, fruit juice, and dairy products.  Cut out carbonated drinks, chewing gum, and "gassy" foods, such as beans and cabbage. This may help relieve bloating and belching.  Keep track of what foods seem to trigger your symptoms.  Avoid emotionally charged situations or circumstances that produce anxiety.  Start or continue exercising.  Get plenty of rest and sleep.  Follow up with your doctor in 2-4 weeks if not resolved or sooner if worsening.

## 2012-05-30 DIAGNOSIS — H16229 Keratoconjunctivitis sicca, not specified as Sjogren's, unspecified eye: Secondary | ICD-10-CM | POA: Diagnosis not present

## 2012-05-30 DIAGNOSIS — H25019 Cortical age-related cataract, unspecified eye: Secondary | ICD-10-CM | POA: Diagnosis not present

## 2012-05-30 DIAGNOSIS — H40009 Preglaucoma, unspecified, unspecified eye: Secondary | ICD-10-CM | POA: Diagnosis not present

## 2012-06-07 ENCOUNTER — Ambulatory Visit: Payer: Medicare Other | Admitting: Internal Medicine

## 2012-07-03 ENCOUNTER — Telehealth: Payer: Self-pay | Admitting: Internal Medicine

## 2012-07-03 MED ORDER — LISINOPRIL 20 MG PO TABS: 20.0000 mg | ORAL_TABLET | Freq: Every day | ORAL | Status: DC

## 2012-07-03 NOTE — Telephone Encounter (Signed)
Patient called stating that she need a refill of her lisinopril 20mg  1poqd sent to [facility] as she has an appt scheduled and this was denied. Pleasea assist.

## 2012-07-03 NOTE — Telephone Encounter (Signed)
done

## 2012-07-05 DIAGNOSIS — L659 Nonscarring hair loss, unspecified: Secondary | ICD-10-CM | POA: Diagnosis not present

## 2012-07-05 DIAGNOSIS — L570 Actinic keratosis: Secondary | ICD-10-CM | POA: Diagnosis not present

## 2012-07-07 ENCOUNTER — Ambulatory Visit: Payer: Medicare Other | Admitting: Internal Medicine

## 2012-07-12 NOTE — Assessment & Plan Note (Signed)
Adequate control Continue same meds 

## 2012-07-12 NOTE — Assessment & Plan Note (Signed)
Lipids have been controlled- no need to check labs

## 2012-07-12 NOTE — Progress Notes (Signed)
Patient ID: Destiny Acosta, female   DOB: [birth year], 67 y.o.   MRN: [id number] Aortic stenosis-=- followed by [facility]. She has no sxs  htn- tolerating meds  10/13- went to ED for sxs of uti-- culture was negative (cipro caused joint aches)  Reviewed pmh, psh, sochx, meds   patient denies chest pain, shortness of breath, orthopnea. Denies lower extremity edema, abdominal pain, change in appetite, change in bowel movements. Patient denies rashes, musculoskeletal complaints. No other specific complaints in a complete review of systems.    Well-developed well-nourished female in no acute distress. HEENT exam atraumatic, normocephalic, extraocular muscles are intact. Neck is supple. No jugular venous distention no thyromegaly. Chest clear to auscultation without increased work of breathing. Cardiac exam S1 and S2 are regular. Abdominal exam active bowel sounds, soft, nontender. Extremities no edema. Neurologic exam she is alert without any motor sensory deficits. Gait is normal.

## 2012-07-13 ENCOUNTER — Encounter: Payer: Self-pay | Admitting: Internal Medicine

## 2012-07-13 ENCOUNTER — Ambulatory Visit (INDEPENDENT_AMBULATORY_CARE_PROVIDER_SITE_OTHER): Payer: Medicare Other | Admitting: Internal Medicine

## 2012-07-13 VITALS — BP 122/80 | HR 80 | Temp 98.1°F | Ht 65.0 in | Wt 151.0 lb

## 2012-07-13 DIAGNOSIS — E785 Hyperlipidemia, unspecified: Secondary | ICD-10-CM | POA: Diagnosis not present

## 2012-07-13 DIAGNOSIS — I10 Essential (primary) hypertension: Secondary | ICD-10-CM | POA: Diagnosis not present

## 2012-07-13 LAB — POCT URINALYSIS DIPSTICK
Bilirubin, UA: NEGATIVE
Ketones, UA: NEGATIVE
Protein, UA: NEGATIVE

## 2012-07-13 LAB — BASIC METABOLIC PANEL
CO2: 27 mEq/L (ref 19–32)
Chloride: 104 mEq/L (ref 96–112)
GFR: 105.75 mL/min (ref >60.00)
Glucose, Bld: 92 mg/dL (ref 70–99)
Potassium: 4.8 mEq/L (ref 3.5–5.1)
Sodium: 140 mEq/L (ref 135–145)

## 2012-07-13 LAB — LIPID PANEL
Cholesterol: 195 mg/dL (ref 0–200)
VLDL: 11.4 mg/dL (ref 0.0–40.0)

## 2012-07-13 NOTE — Assessment & Plan Note (Signed)
Has regular f/u at [facility]

## 2012-08-01 ENCOUNTER — Ambulatory Visit: Payer: Medicare Other | Admitting: Internal Medicine

## 2012-08-01 ENCOUNTER — Other Ambulatory Visit: Payer: Self-pay | Admitting: Obstetrics and Gynecology

## 2012-08-01 DIAGNOSIS — N6001 Solitary cyst of right breast: Secondary | ICD-10-CM

## 2012-08-18 DIAGNOSIS — M19049 Primary osteoarthritis, unspecified hand: Secondary | ICD-10-CM | POA: Diagnosis not present

## 2012-08-23 ENCOUNTER — Ambulatory Visit
Admission: RE | Admit: 2012-08-23 | Discharge: 2012-08-23 | Disposition: A | Discharge status: Home / Self Care | Payer: Medicare Other | Source: Ambulatory Visit | Attending: Obstetrics and Gynecology | Admitting: Obstetrics and Gynecology

## 2012-08-23 DIAGNOSIS — R928 Other abnormal and inconclusive findings on diagnostic imaging of breast: Secondary | ICD-10-CM | POA: Diagnosis not present

## 2012-08-23 DIAGNOSIS — N6001 Solitary cyst of right breast: Secondary | ICD-10-CM

## 2012-09-05 DIAGNOSIS — R350 Frequency of micturition: Secondary | ICD-10-CM | POA: Diagnosis not present

## 2012-09-05 DIAGNOSIS — N393 Stress incontinence (female) (male): Secondary | ICD-10-CM | POA: Diagnosis not present

## 2012-09-05 DIAGNOSIS — IMO0002 Reserved for concepts with insufficient information to code with codable children: Secondary | ICD-10-CM | POA: Diagnosis not present

## 2012-09-15 ENCOUNTER — Other Ambulatory Visit: Payer: Self-pay | Admitting: Obstetrics and Gynecology

## 2012-09-15 DIAGNOSIS — N641 Fat necrosis of breast: Secondary | ICD-10-CM

## 2012-10-09 DIAGNOSIS — M25569 Pain in unspecified knee: Secondary | ICD-10-CM | POA: Diagnosis not present

## 2012-10-14 DIAGNOSIS — M25569 Pain in unspecified knee: Secondary | ICD-10-CM | POA: Diagnosis not present

## 2012-10-24 ENCOUNTER — Ambulatory Visit
Admission: RE | Admit: 2012-10-24 | Discharge: 2012-10-24 | Disposition: A | Discharge status: Home / Self Care | Payer: Medicare Other | Source: Ambulatory Visit | Attending: Obstetrics and Gynecology | Admitting: Obstetrics and Gynecology

## 2012-10-24 DIAGNOSIS — R922 Inconclusive mammogram: Secondary | ICD-10-CM | POA: Diagnosis not present

## 2012-10-24 DIAGNOSIS — N641 Fat necrosis of breast: Secondary | ICD-10-CM

## 2012-11-01 DIAGNOSIS — M25569 Pain in unspecified knee: Secondary | ICD-10-CM | POA: Diagnosis not present

## 2012-11-01 DIAGNOSIS — IMO0002 Reserved for concepts with insufficient information to code with codable children: Secondary | ICD-10-CM | POA: Diagnosis not present

## 2012-11-03 DIAGNOSIS — H40009 Preglaucoma, unspecified, unspecified eye: Secondary | ICD-10-CM | POA: Diagnosis not present

## 2012-11-03 DIAGNOSIS — H16229 Keratoconjunctivitis sicca, not specified as Sjogren's, unspecified eye: Secondary | ICD-10-CM | POA: Diagnosis not present

## 2012-11-03 DIAGNOSIS — H25019 Cortical age-related cataract, unspecified eye: Secondary | ICD-10-CM | POA: Diagnosis not present

## 2012-11-21 DIAGNOSIS — T1510XA Foreign body in conjunctival sac, unspecified eye, initial encounter: Secondary | ICD-10-CM | POA: Diagnosis not present

## 2012-11-23 DIAGNOSIS — M2550 Pain in unspecified joint: Secondary | ICD-10-CM | POA: Diagnosis not present

## 2012-12-01 DIAGNOSIS — H526 Other disorders of refraction: Secondary | ICD-10-CM | POA: Diagnosis not present

## 2012-12-01 DIAGNOSIS — H40009 Preglaucoma, unspecified, unspecified eye: Secondary | ICD-10-CM | POA: Diagnosis not present

## 2012-12-01 DIAGNOSIS — H25019 Cortical age-related cataract, unspecified eye: Secondary | ICD-10-CM | POA: Diagnosis not present

## 2012-12-12 DIAGNOSIS — R32 Unspecified urinary incontinence: Secondary | ICD-10-CM | POA: Diagnosis not present

## 2012-12-12 DIAGNOSIS — Z124 Encounter for screening for malignant neoplasm of cervix: Secondary | ICD-10-CM | POA: Diagnosis not present

## 2012-12-12 DIAGNOSIS — Z01419 Encounter for gynecological examination (general) (routine) without abnormal findings: Secondary | ICD-10-CM | POA: Diagnosis not present

## 2012-12-13 DIAGNOSIS — Z1211 Encounter for screening for malignant neoplasm of colon: Secondary | ICD-10-CM | POA: Diagnosis not present

## 2012-12-18 ENCOUNTER — Other Ambulatory Visit: Payer: Self-pay | Admitting: Obstetrics and Gynecology

## 2012-12-18 DIAGNOSIS — E2839 Other primary ovarian failure: Secondary | ICD-10-CM

## 2012-12-21 DIAGNOSIS — L4050 Arthropathic psoriasis, unspecified: Secondary | ICD-10-CM | POA: Diagnosis not present

## 2012-12-21 DIAGNOSIS — M542 Cervicalgia: Secondary | ICD-10-CM | POA: Diagnosis not present

## 2012-12-21 DIAGNOSIS — M47812 Spondylosis without myelopathy or radiculopathy, cervical region: Secondary | ICD-10-CM | POA: Diagnosis not present

## 2012-12-29 ENCOUNTER — Ambulatory Visit
Admission: RE | Admit: 2012-12-29 | Discharge: 2012-12-29 | Disposition: A | Discharge status: Home / Self Care | Payer: Medicare Other | Source: Ambulatory Visit | Attending: Obstetrics and Gynecology | Admitting: Obstetrics and Gynecology

## 2012-12-29 DIAGNOSIS — Z780 Asymptomatic menopausal state: Secondary | ICD-10-CM | POA: Diagnosis not present

## 2012-12-29 DIAGNOSIS — E2839 Other primary ovarian failure: Secondary | ICD-10-CM

## 2013-01-30 DIAGNOSIS — L909 Atrophic disorder of skin, unspecified: Secondary | ICD-10-CM | POA: Diagnosis not present

## 2013-01-30 DIAGNOSIS — L578 Other skin changes due to chronic exposure to nonionizing radiation: Secondary | ICD-10-CM | POA: Diagnosis not present

## 2013-02-01 DIAGNOSIS — L4050 Arthropathic psoriasis, unspecified: Secondary | ICD-10-CM | POA: Diagnosis not present

## 2013-02-01 DIAGNOSIS — M159 Polyosteoarthritis, unspecified: Secondary | ICD-10-CM | POA: Diagnosis not present

## 2013-02-09 ENCOUNTER — Ambulatory Visit (INDEPENDENT_AMBULATORY_CARE_PROVIDER_SITE_OTHER): Payer: Medicare Other

## 2013-02-09 DIAGNOSIS — Z23 Encounter for immunization: Secondary | ICD-10-CM | POA: Diagnosis not present

## 2013-03-16 DIAGNOSIS — E7800 Pure hypercholesterolemia, unspecified: Secondary | ICD-10-CM | POA: Diagnosis not present

## 2013-03-16 DIAGNOSIS — I359 Nonrheumatic aortic valve disorder, unspecified: Secondary | ICD-10-CM | POA: Diagnosis not present

## 2013-04-06 ENCOUNTER — Ambulatory Visit: Payer: Medicare Other | Admitting: Internal Medicine

## 2013-04-10 ENCOUNTER — Ambulatory Visit: Payer: Medicare Other | Admitting: Internal Medicine

## 2013-04-18 ENCOUNTER — Encounter: Payer: Self-pay | Admitting: Internal Medicine

## 2013-04-18 ENCOUNTER — Ambulatory Visit (INDEPENDENT_AMBULATORY_CARE_PROVIDER_SITE_OTHER): Payer: Medicare Other | Admitting: Internal Medicine

## 2013-04-18 VITALS — BP 114/76 | Temp 98.1°F | Ht 65.0 in | Wt 150.0 lb

## 2013-04-18 DIAGNOSIS — I10 Essential (primary) hypertension: Secondary | ICD-10-CM | POA: Diagnosis not present

## 2013-04-18 DIAGNOSIS — Z23 Encounter for immunization: Secondary | ICD-10-CM

## 2013-04-18 DIAGNOSIS — I359 Nonrheumatic aortic valve disorder, unspecified: Secondary | ICD-10-CM

## 2013-04-18 DIAGNOSIS — E785 Hyperlipidemia, unspecified: Secondary | ICD-10-CM

## 2013-04-18 DIAGNOSIS — M19049 Primary osteoarthritis, unspecified hand: Secondary | ICD-10-CM | POA: Diagnosis not present

## 2013-04-18 DIAGNOSIS — I350 Nonrheumatic aortic (valve) stenosis: Secondary | ICD-10-CM

## 2013-04-18 NOTE — Progress Notes (Signed)
Pre visit review using our clinic review tool, if applicable. No additional management support is needed unless otherwise documented below in the visit note. 

## 2013-04-20 ENCOUNTER — Other Ambulatory Visit: Payer: Medicare Other

## 2013-04-20 LAB — LIPID PANEL
Total CHOL/HDL Ratio: 3
Triglycerides: 71 mg/dL (ref 0.0–149.0)

## 2013-04-20 LAB — LDL CHOLESTEROL, DIRECT: Direct LDL: 128.6 mg/dL

## 2013-04-20 NOTE — Assessment & Plan Note (Signed)
Followed by rheumatology. 

## 2013-04-20 NOTE — Assessment & Plan Note (Signed)
BP Readings from Last 3 Encounters:  04/18/13 114/76  07/13/12 122/80  05/08/12 102/80   Reviewed . Well controlled. Continue meds

## 2013-04-20 NOTE — Progress Notes (Signed)
Here for f/u Feels well  htn- needs labs and refill meds  Lipids- not on meds   Psoriatic arthritis- sees rheumatology  Past Medical History  Diagnosis Date  . HYPERLIPIDEMIA 12/06/2006  . HYPERTENSION 12/06/2006  . MITRAL VALVE PROLAPSE 12/06/2006  . GERD 12/06/2006  . Aortal stenosis 05/14/2010  . Allergy     Eggplant/KIWI  . Heart murmur     aortic valve  . Hyperplastic colon polyp     History   Social History  . Marital Status: Married    Spouse Name: N/A    Number of Children: N/A  . Years of Education: N/A   Occupational History  . Not on file.   Social History Main Topics  . Smoking status: Never Smoker   . Smokeless tobacco: Never Used  . Alcohol Use: Yes     Comment: rarely may have 1 wine a month or less  . Drug Use: No  . Sexual Activity: Not on file   Other Topics Concern  . Not on file   Social History Narrative  . No narrative on file    Past Surgical History  Procedure Laterality Date  . Tubal ligation      Family History  Problem Relation Age of Onset  . Colon cancer Father   . Atrial fibrillation Father   . Diabetes Mother   . Heart failure Mother   . Heart disease Mother 24    CABG  . Diabetes Maternal Grandmother   . Arthritis Sister   . Diabetes Sister     pre-diabetic    Allergies  Allergen Reactions  . Ciprofloxacin Other (See Comments)    Muscle pain  . Codeine Phosphate     REACTION: nausea, vomiting  . Latex Rash    Current Outpatient Prescriptions on File Prior to Visit  Medication Sig Dispense Refill  . Calcium Carbonate-Vitamin D (CALTRATE 600+D) 600-400 MG-UNIT per tablet Take 1 tablet by mouth daily.        . Doxylamine Succinate, Sleep, (UNISOM) 25 MG tablet Take 25 mg by mouth at bedtime as needed.        . fexofenadine (ALLEGRA) 180 MG tablet Take 180 mg by mouth daily as needed.       Marland Kitchen ibuprofen (ADVIL,MOTRIN) 200 MG tablet Take 200 mg by mouth as needed. pain      . lisinopril (PRINIVIL,ZESTRIL) 20 MG  tablet Take 1 tablet (20 mg total) by mouth daily.  90 tablet  3  . Magnesium 250 MG TABS Take 1 tablet by mouth 2 (two) times daily.        . Multiple Vitamin (MULTIVITAMIN) capsule Take 1 capsule by mouth daily.        Marland Kitchen tretinoin (RETIN-A) 0.1 % cream Apply topically at bedtime.         No current facility-administered medications on file prior to visit.     patient denies chest pain, shortness of breath, orthopnea. Denies lower extremity edema, abdominal pain, change in appetite, change in bowel movements. Patient denies rashes, musculoskeletal complaints. No other specific complaints in a complete review of systems.   BP 114/76  Temp(Src) 98.1 F (36.7 C) (Oral)  Ht 5\' 5"  (1.651 m)  Wt 150 lb (68.04 kg)  BMI 24.96 kg/m2  Well-developed well-nourished female in no acute distress. HEENT exam atraumatic, normocephalic, extraocular muscles are intact. Neck is supple. No jugular venous distention no thyromegaly. Chest clear to auscultation without increased work of breathing. Cardiac exam S1 and S2 are  regular. Abdominal exam active bowel sounds, soft, nontender. Extremities no edema. Neurologic exam she is alert without any motor sensory deficits. Gait is normal.

## 2013-04-20 NOTE — Assessment & Plan Note (Signed)
Reviewed echo- no need for f/u now

## 2013-04-30 DIAGNOSIS — N390 Urinary tract infection, site not specified: Secondary | ICD-10-CM | POA: Diagnosis not present

## 2013-04-30 DIAGNOSIS — R300 Dysuria: Secondary | ICD-10-CM | POA: Diagnosis not present

## 2013-05-07 DIAGNOSIS — L4050 Arthropathic psoriasis, unspecified: Secondary | ICD-10-CM | POA: Diagnosis not present

## 2013-05-07 DIAGNOSIS — M159 Polyosteoarthritis, unspecified: Secondary | ICD-10-CM | POA: Diagnosis not present

## 2013-06-04 DIAGNOSIS — H25019 Cortical age-related cataract, unspecified eye: Secondary | ICD-10-CM | POA: Diagnosis not present

## 2013-06-04 DIAGNOSIS — H16229 Keratoconjunctivitis sicca, not specified as Sjogren's, unspecified eye: Secondary | ICD-10-CM | POA: Diagnosis not present

## 2013-06-04 DIAGNOSIS — H40009 Preglaucoma, unspecified, unspecified eye: Secondary | ICD-10-CM | POA: Diagnosis not present

## 2013-06-04 DIAGNOSIS — D3130 Benign neoplasm of unspecified choroid: Secondary | ICD-10-CM | POA: Diagnosis not present

## 2013-07-19 DIAGNOSIS — L570 Actinic keratosis: Secondary | ICD-10-CM | POA: Diagnosis not present

## 2013-07-19 DIAGNOSIS — D485 Neoplasm of uncertain behavior of skin: Secondary | ICD-10-CM | POA: Diagnosis not present

## 2013-07-19 DIAGNOSIS — Z85828 Personal history of other malignant neoplasm of skin: Secondary | ICD-10-CM | POA: Diagnosis not present

## 2013-07-19 DIAGNOSIS — L259 Unspecified contact dermatitis, unspecified cause: Secondary | ICD-10-CM | POA: Diagnosis not present

## 2013-08-03 DIAGNOSIS — L4050 Arthropathic psoriasis, unspecified: Secondary | ICD-10-CM | POA: Diagnosis not present

## 2013-08-03 DIAGNOSIS — M159 Polyosteoarthritis, unspecified: Secondary | ICD-10-CM | POA: Diagnosis not present

## 2013-08-08 ENCOUNTER — Other Ambulatory Visit: Payer: Self-pay | Admitting: Internal Medicine

## 2013-08-20 ENCOUNTER — Ambulatory Visit (INDEPENDENT_AMBULATORY_CARE_PROVIDER_SITE_OTHER): Payer: Medicare Other | Admitting: Internal Medicine

## 2013-08-20 ENCOUNTER — Encounter: Payer: Self-pay | Admitting: Internal Medicine

## 2013-08-20 VITALS — BP 112/72 | HR 76 | Temp 97.6°F | Ht 65.0 in | Wt 154.0 lb

## 2013-08-20 DIAGNOSIS — L4050 Arthropathic psoriasis, unspecified: Secondary | ICD-10-CM

## 2013-08-20 NOTE — Progress Notes (Signed)
Presumed psoriatic arthritis- labs done at rheumatologist [facility] rheumatology)  She comes in today to discuss recommendations around intravenous "biologics"  I encouraged her to follow the recommendations of her rheumatologist ([place]rel).   Total time 20 minutes All discussion related to risks and benefits.

## 2013-08-20 NOTE — Progress Notes (Signed)
Pre visit review using our clinic review tool, if applicable. No additional management support is needed unless otherwise documented below in the visit note. 

## 2013-08-23 DIAGNOSIS — R350 Frequency of micturition: Secondary | ICD-10-CM | POA: Diagnosis not present

## 2013-08-31 DIAGNOSIS — H25019 Cortical age-related cataract, unspecified eye: Secondary | ICD-10-CM | POA: Diagnosis not present

## 2013-08-31 DIAGNOSIS — D3130 Benign neoplasm of unspecified choroid: Secondary | ICD-10-CM | POA: Diagnosis not present

## 2013-08-31 DIAGNOSIS — H40009 Preglaucoma, unspecified, unspecified eye: Secondary | ICD-10-CM | POA: Diagnosis not present

## 2013-08-31 DIAGNOSIS — H526 Other disorders of refraction: Secondary | ICD-10-CM | POA: Diagnosis not present

## 2013-10-16 ENCOUNTER — Other Ambulatory Visit: Payer: Self-pay

## 2013-10-16 DIAGNOSIS — Z1231 Encounter for screening mammogram for malignant neoplasm of breast: Secondary | ICD-10-CM

## 2013-10-18 DIAGNOSIS — M19049 Primary osteoarthritis, unspecified hand: Secondary | ICD-10-CM | POA: Diagnosis not present

## 2013-10-18 DIAGNOSIS — M159 Polyosteoarthritis, unspecified: Secondary | ICD-10-CM | POA: Diagnosis not present

## 2013-10-24 DIAGNOSIS — M159 Polyosteoarthritis, unspecified: Secondary | ICD-10-CM | POA: Diagnosis not present

## 2013-10-24 DIAGNOSIS — L4050 Arthropathic psoriasis, unspecified: Secondary | ICD-10-CM | POA: Diagnosis not present

## 2013-10-25 ENCOUNTER — Ambulatory Visit
Admission: RE | Admit: 2013-10-25 | Discharge: 2013-10-25 | Disposition: A | Discharge status: Home / Self Care | Payer: Medicare Other | Source: Ambulatory Visit

## 2013-10-25 DIAGNOSIS — Z1231 Encounter for screening mammogram for malignant neoplasm of breast: Secondary | ICD-10-CM | POA: Diagnosis not present

## 2013-11-22 DIAGNOSIS — H40009 Preglaucoma, unspecified, unspecified eye: Secondary | ICD-10-CM | POA: Diagnosis not present

## 2013-12-25 DIAGNOSIS — IMO0002 Reserved for concepts with insufficient information to code with codable children: Secondary | ICD-10-CM | POA: Diagnosis not present

## 2014-01-18 ENCOUNTER — Ambulatory Visit (INDEPENDENT_AMBULATORY_CARE_PROVIDER_SITE_OTHER): Payer: Medicare Other

## 2014-01-18 DIAGNOSIS — Z23 Encounter for immunization: Secondary | ICD-10-CM | POA: Diagnosis not present

## 2014-01-24 DIAGNOSIS — M542 Cervicalgia: Secondary | ICD-10-CM | POA: Diagnosis not present

## 2014-01-24 DIAGNOSIS — M159 Polyosteoarthritis, unspecified: Secondary | ICD-10-CM | POA: Diagnosis not present

## 2014-01-24 DIAGNOSIS — L4050 Arthropathic psoriasis, unspecified: Secondary | ICD-10-CM | POA: Diagnosis not present

## 2014-02-03 IMAGING — US US BREAST*R*
1 series · 1 of 1 positions shown · non-contrast
Comparison: February 03, 2012

CLINICAL DATA: Follow-up right breast.

RIGHT BREAST ULTRASOUND

[Series 1: us breast*right* · 1 of 1 slices shown]
[im 1/1]
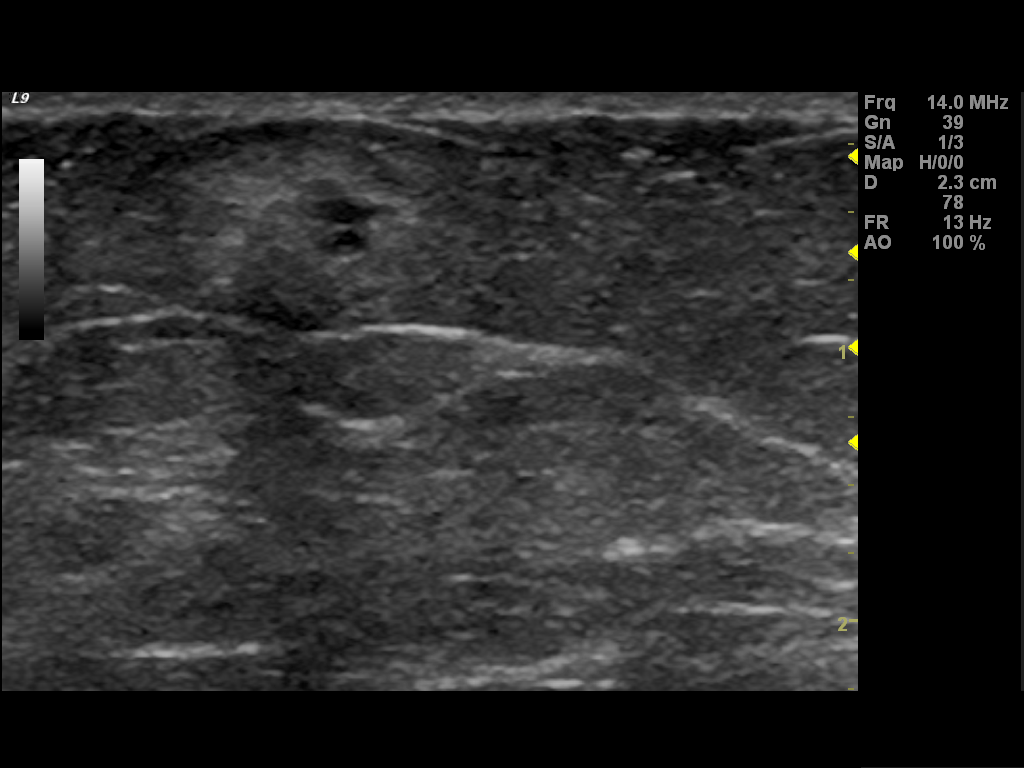

[1 of 1 positions shown; findings below may reference images not displayed]

FINDINGS: Ultrasound is performed, showing two small cystic areas
in the right breast four o'clock position 4 cm from the nipple with
surrounding hyper echogenicity.  This probably due to resolving
hematoma with central cyst.
IMPRESSION: Probable benign findings.

RECOMMENDATION:
6-month follow-up ultrasound right breast.

BI-RADS CATEGORY 3:  Probably benign finding(s) - short interval
follow-up suggested.

## 2014-05-28 DIAGNOSIS — L4059 Other psoriatic arthropathy: Secondary | ICD-10-CM | POA: Diagnosis not present

## 2014-05-28 DIAGNOSIS — H40003 Preglaucoma, unspecified, bilateral: Secondary | ICD-10-CM | POA: Diagnosis not present

## 2014-05-28 DIAGNOSIS — M25562 Pain in left knee: Secondary | ICD-10-CM | POA: Diagnosis not present

## 2014-05-28 DIAGNOSIS — H25013 Cortical age-related cataract, bilateral: Secondary | ICD-10-CM | POA: Diagnosis not present

## 2014-05-28 DIAGNOSIS — D3132 Benign neoplasm of left choroid: Secondary | ICD-10-CM | POA: Diagnosis not present

## 2014-05-28 DIAGNOSIS — M25561 Pain in right knee: Secondary | ICD-10-CM | POA: Diagnosis not present

## 2014-05-28 DIAGNOSIS — M159 Polyosteoarthritis, unspecified: Secondary | ICD-10-CM | POA: Diagnosis not present

## 2014-06-10 ENCOUNTER — Ambulatory Visit (INDEPENDENT_AMBULATORY_CARE_PROVIDER_SITE_OTHER): Payer: Medicare Other | Admitting: Family Medicine

## 2014-06-10 ENCOUNTER — Encounter: Payer: Self-pay | Admitting: Family Medicine

## 2014-06-10 DIAGNOSIS — I10 Essential (primary) hypertension: Secondary | ICD-10-CM | POA: Diagnosis not present

## 2014-06-10 DIAGNOSIS — I350 Nonrheumatic aortic (valve) stenosis: Secondary | ICD-10-CM

## 2014-06-10 DIAGNOSIS — E785 Hyperlipidemia, unspecified: Secondary | ICD-10-CM | POA: Diagnosis not present

## 2014-06-10 DIAGNOSIS — Z23 Encounter for immunization: Secondary | ICD-10-CM

## 2014-06-10 DIAGNOSIS — G4700 Insomnia, unspecified: Secondary | ICD-10-CM | POA: Insufficient documentation

## 2014-06-10 DIAGNOSIS — J309 Allergic rhinitis, unspecified: Secondary | ICD-10-CM | POA: Insufficient documentation

## 2014-06-10 MED ORDER — LISINOPRIL 10 MG PO TABS: 10.0000 mg | ORAL_TABLET | Freq: Every day | ORAL | Status: DC

## 2014-06-10 MED ORDER — LISINOPRIL 20 MG PO TABS
20.0000 mg | ORAL_TABLET | Freq: Every day | ORAL | Status: DC
Start: 2014-06-10 — End: 2014-06-10

## 2014-06-10 NOTE — Assessment & Plan Note (Addendum)
No history MI/CVA. Memory loss on lipitor very distressing. Discussed could occur on other statins. Balancing benefits/risks patient declines mediation at this time. Has had GI upset on stomach and we discussed trialing 81mg  every 3-7 days to lower cardiac risk. Mom CABG at 79, weight 200-250 and DM much different patient than patient

## 2014-06-10 NOTE — Progress Notes (Signed)
Destiny Reddish, MD Phone: [phone]  Subjective:  Patient presents today to establish care with me as their new primary care provider. Patient was formerly a patient of Dr. Leanne Chang. Chief complaint-noted.   Hypertension-controlled on lisinopril 20mg   BP Readings from Last 3 Encounters:  06/10/14 102/70  08/20/13 112/72  04/18/13 114/76  Home BP monitoring-no Compliant with medications-yes without side effects ROS-Denies any CP, HA, SOB, blurry vision, LE edema   Hyperlipidemia-mild poor control  Lab Results  Component Value Date   LDLCALC 128* 07/13/2012  On statin: no Regular exercise: yes walks 2-3 miles most days when not very cold Diet: eats well ROS- no chest pain or shortness of breath. No myalgias  Aortic stenosis/regurgitation- presumably stable Mild on last echo. No longer seeing cardiology ROS- no chest pain, shortness of breath, diarrhea, some facial flushing with no other symptoms. No edema. No unintentional weight loss.   The following were reviewed and entered/updated in epic: Past Medical History  Diagnosis Date  . HYPERLIPIDEMIA 12/06/2006  . HYPERTENSION 12/06/2006  . MITRAL VALVE PROLAPSE 12/06/2006  . GERD 12/06/2006  . Aortal stenosis 05/14/2010  . Allergy     Eggplant/KIWI  . Heart murmur     aortic valve  . Hyperplastic colon polyp    Patient Active Problem List   Diagnosis Date Noted  . Psoriatic arthritis 08/10/2011    Priority: Medium  . Aortic stenosis 05/14/2010    Priority: Medium  . Hyperlipidemia 12/06/2006    Priority: Medium  . Hypertension 12/06/2006    Priority: Medium  . Allergic rhinitis 06/10/2014    Priority: Low  . Insomnia 06/10/2014    Priority: Low  . History of colonic polyps 12/21/2006    Priority: Low   Past Surgical History  Procedure Laterality Date  . Tubal ligation      Family History  Problem Relation Age of Onset  . Colon cancer Father 5  . Atrial fibrillation Father   . Diabetes Mother   . Heart  failure Mother   . Heart disease Mother 67    CABG, quit 20 years prior o bypass  . Diabetes Maternal Grandmother   . Arthritis Sister   . Diabetes Sister     pre-diabetic    Medications- reviewed and updated Current Outpatient Prescriptions  Medication Sig Dispense Refill  . Biotin 5000 MCG TABS Take 1 tablet by mouth daily.    . Calcium Carbonate-Vitamin D (CALTRATE 600+D) 600-400 MG-UNIT per tablet Take 1 tablet by mouth daily.      Marland Kitchen conjugated estrogens (PREMARIN) vaginal cream Place 1 Applicatorful vaginally once a week.    . fexofenadine (ALLEGRA) 180 MG tablet Take 180 mg by mouth daily as needed.     Marland Kitchen lisinopril (PRINIVIL,ZESTRIL) 10 MG tablet Take 1 tablet (10 mg total) by mouth daily. 90 tablet 3  . Magnesium 250 MG TABS Take 1 tablet by mouth 2 (two) times daily.      . Minoxidil 5 % FOAM Apply 1 application topically daily.    . Multiple Vitamin (MULTIVITAMIN) capsule Take 1 capsule by mouth daily.      Marland Kitchen tretinoin (RETIN-A) 0.1 % cream Apply topically at bedtime.      . Doxylamine Succinate, Sleep, (UNISOM) 25 MG tablet Take 25 mg by mouth at bedtime as needed.      Marland Kitchen ibuprofen (ADVIL,MOTRIN) 200 MG tablet Take 200 mg by mouth as needed. pain     No current facility-administered medications for this visit.    Allergies-reviewed  and updated Allergies  Allergen Reactions  . Ciprofloxacin Other (See Comments)    Muscle pain  . Codeine Phosphate     REACTION: nausea, vomiting  . Latex Rash    History   Social History  . Marital Status: Married    Spouse Name: N/A    Number of Children: N/A  . Years of Education: N/A   Social History Main Topics  . Smoking status: Never Smoker   . Smokeless tobacco: Never Used  . Alcohol Use: Yes     Comment: rarely may have 1 wine a month or less  . Drug Use: No  . Sexual Activity: None   Other Topics Concern  . None   Social History Narrative   Married 1967. 1 son, lives in [place]. 1 grandson in [place]  age 70.       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       Hobbies: family time, walking 2.5-3 miles daily, moviews     ROS--See HPI   Objective: BP 102/70 mmHg  Temp(Src) 97.6 F (36.4 C)  Wt 152 lb (68.947 kg) Gen: NAD, resting comfortably CV: RRR no murmurs rubs or gallops Lungs: CTAB no crackles, wheeze, rhonchi Abdomen: soft/nontender/nondistended/normal bowel sounds.  Ext: no edema Skin: warm, dry, no rash  Neuro: grossly normal, moves all extremities, normal gait   Assessment/Plan:  Hypertension Last 3 BPs 102-114 with good diastolic control. Patient agrees to trial 10mg  and follow up next visit with home monitoring for sooner return.    Hyperlipidemia No history MI/CVA. Memory loss on lipitor very distressing. Discussed could occur on other statins. Balancing benefits/risks patient declines mediation at this time. Has had GI upset on stomach and we discussed trialing 81mg  every 3-7 days to lower cardiac risk. Mom CABG at 19, weight 200-250 and DM much different patient than patient   Aortic stenosis Persistent murmur on exam. Last echo 2012. No new symptoms. Would consider repeat echo at next visit to assess stability and continue every 3-4 months    Return precautions advised. Follow up 3-6 months. Regarding ROS and facial flushing. Estrogen would be a concern. No diarrhea but if this occurs or flushing worsens could consider 24 hour urine 5-HIAA. Emotional could certainly contribute. No headaches, sweating, heart racing to suggest pheo.   Future fasting labs, wants a copy mailed to her Orders Placed This Encounter  Procedures  . Pneumococcal conjugate vaccine 13-valent  . CBC    [place]    Standing Status: Future     Number of Occurrences:      Standing Expiration Date: 06/11/2015  . Comprehensive metabolic panel    [place]    Standing Status: Future     Number of Occurrences:      Standing Expiration Date: 06/11/2015    Order Specific  Question:  Has the patient fasted?    Answer:  No  . TSH    [place]    Standing Status: Future     Number of Occurrences:      Standing Expiration Date: 06/11/2015  . Lipid panel    [place]    Standing Status: Future     Number of Occurrences:      Standing Expiration Date: 06/11/2015    Order Specific Question:  Has the patient fasted?    Answer:  No    Meds ordered this encounter  Medications  . DISCONTD: lisinopril (PRINIVIL,ZESTRIL) 20 MG tablet    Sig: Take 1 tablet (20 mg total) by  mouth daily.    Dispense:  90 tablet    Refill:  0  . lisinopril (PRINIVIL,ZESTRIL) 10 MG tablet    Sig: Take 1 tablet (10 mg total) by mouth daily.    Dispense:  90 tablet    Refill:  3

## 2014-06-10 NOTE — Assessment & Plan Note (Signed)
Last 3 BPs 102-114 with good diastolic control. Patient agrees to trial 10mg  and follow up next visit with home monitoring for sooner return.

## 2014-06-10 NOTE — Assessment & Plan Note (Signed)
Persistent murmur on exam. Last echo 2012. No new symptoms. Would consider repeat echo at next visit to assess stability and continue every 3-4 months

## 2014-06-10 NOTE — Patient Instructions (Addendum)
Baby aspirin 81mg  every 3-4 days if does not upset stomach. Space out to once a week if does, if continues to bother stomach, stop completely.   Try 1/2 a tab of lisinopril instead of full tab, check intermittently at pharmacy's and if >140/90 see me sooner  See me in 3-6 months for repeat in office  Consider repeat echocardiogram when I see you back to ensure stability  Prevnar today (final pneumonia vaccine)

## 2014-06-11 ENCOUNTER — Other Ambulatory Visit (INDEPENDENT_AMBULATORY_CARE_PROVIDER_SITE_OTHER): Payer: Medicare Other

## 2014-06-11 DIAGNOSIS — E785 Hyperlipidemia, unspecified: Secondary | ICD-10-CM | POA: Diagnosis not present

## 2014-06-11 DIAGNOSIS — I10 Essential (primary) hypertension: Secondary | ICD-10-CM

## 2014-06-11 LAB — CBC
HCT: 40.1 % (ref 36.0–46.0)
HEMOGLOBIN: 13.5 g/dL (ref 12.0–15.0)
MCHC: 33.7 g/dL (ref 30.0–36.0)
MCV: 91.3 fl (ref 78.0–100.0)
Platelets: 299 10*3/uL (ref 150.0–400.0)
RBC: 4.39 Mil/uL (ref 3.87–5.11)
RDW: 14.2 % (ref 11.5–15.5)
WBC: 6.4 10*3/uL (ref 4.0–10.5)

## 2014-06-11 LAB — LIPID PANEL
Cholesterol: 182 mg/dL (ref 0–200)
HDL: 66 mg/dL (ref >39.00)
LDL Cholesterol: 105 mg/dL — ABNORMAL HIGH (ref 0–99)
NONHDL: 116
TRIGLYCERIDES: 54 mg/dL (ref 0.0–149.0)
Total CHOL/HDL Ratio: 3
VLDL: 10.8 mg/dL (ref 0.0–40.0)

## 2014-06-11 LAB — COMPREHENSIVE METABOLIC PANEL
ALBUMIN: 4.4 g/dL (ref 3.5–5.2)
ALK PHOS: 51 U/L (ref 39–117)
ALT: 15 U/L (ref 0–35)
AST: 17 U/L (ref 0–37)
BUN: 13 mg/dL (ref 6–23)
CO2: 28 mEq/L (ref 19–32)
Calcium: 9.6 mg/dL (ref 8.4–10.5)
Chloride: 105 mEq/L (ref 96–112)
Creatinine, Ser: 0.7 mg/dL (ref 0.40–1.20)
GFR: 88.02 mL/min (ref >60.00)
Glucose, Bld: 82 mg/dL (ref 70–99)
Potassium: 5.6 mEq/L — ABNORMAL HIGH (ref 3.5–5.1)
SODIUM: 138 meq/L (ref 135–145)
TOTAL PROTEIN: 7.1 g/dL (ref 6.0–8.3)
Total Bilirubin: 0.9 mg/dL (ref 0.2–1.2)

## 2014-06-11 LAB — TSH: TSH: 1.24 u[IU]/mL (ref 0.35–4.50)

## 2014-06-12 ENCOUNTER — Telehealth: Payer: Self-pay

## 2014-06-12 ENCOUNTER — Telehealth: Payer: Self-pay | Admitting: Family Medicine

## 2014-06-12 DIAGNOSIS — E875 Hyperkalemia: Secondary | ICD-10-CM

## 2014-06-12 DIAGNOSIS — I350 Nonrheumatic aortic (valve) stenosis: Secondary | ICD-10-CM

## 2014-06-12 NOTE — Telephone Encounter (Signed)
Pt would like to know if ECHO can be done sooner b/c at the last visit it concerned her when you heard her heart murmur right away as where other doctors has had to search to hear it so she thinks something more serious may be going on that needs to be adressed right away and dosent want to put ECHO off for another 3 months.

## 2014-06-12 NOTE — Telephone Encounter (Signed)
You can order 2-d echocardiogram under aortic stenosis.   Looks like labs have already been entered.

## 2014-06-12 NOTE — Telephone Encounter (Signed)
Called pt bck on home phone no answer or VM tried cell and LVM

## 2014-06-12 NOTE — Telephone Encounter (Signed)
Pt notified and ECHO entered

## 2014-06-12 NOTE — Telephone Encounter (Signed)
Error

## 2014-06-12 NOTE — Telephone Encounter (Signed)
Patient is coming for repeat labs on Friday, 06/14/14.  Can you please enter the orders?  Patient would like to talk to you about rescheduling her ECHO to sooner, rather than in May. Please call her at [phone].

## 2014-06-14 ENCOUNTER — Other Ambulatory Visit (INDEPENDENT_AMBULATORY_CARE_PROVIDER_SITE_OTHER): Payer: Medicare Other

## 2014-06-14 ENCOUNTER — Other Ambulatory Visit (HOSPITAL_COMMUNITY): Payer: Self-pay | Admitting: Family Medicine

## 2014-06-14 ENCOUNTER — Ambulatory Visit (HOSPITAL_COMMUNITY): Payer: Medicare Other | Attending: Cardiology | Admitting: Radiology

## 2014-06-14 DIAGNOSIS — I359 Nonrheumatic aortic valve disorder, unspecified: Secondary | ICD-10-CM

## 2014-06-14 DIAGNOSIS — I350 Nonrheumatic aortic (valve) stenosis: Secondary | ICD-10-CM

## 2014-06-14 DIAGNOSIS — E875 Hyperkalemia: Secondary | ICD-10-CM

## 2014-06-14 LAB — BASIC METABOLIC PANEL
BUN: 10 mg/dL (ref 6–23)
CHLORIDE: 103 meq/L (ref 96–112)
CO2: 29 mEq/L (ref 19–32)
Calcium: 9.4 mg/dL (ref 8.4–10.5)
Creatinine, Ser: 0.66 mg/dL (ref 0.40–1.20)
GFR: 94.2 mL/min (ref >60.00)
Glucose, Bld: 82 mg/dL (ref 70–99)
Potassium: 5.3 mEq/L — ABNORMAL HIGH (ref 3.5–5.1)
Sodium: 137 mEq/L (ref 135–145)

## 2014-06-14 NOTE — Progress Notes (Signed)
Echocardiogram performed.  

## 2014-06-15 ENCOUNTER — Telehealth: Payer: Self-pay

## 2014-06-15 NOTE — Telephone Encounter (Signed)
Stop all K supplements and follow up with primary within 2 weeks.

## 2014-06-15 NOTE — Telephone Encounter (Signed)
Spoke with a nurse from [place] that states pt is calling and needs to know if she should stop her lisinopril.  CMP on 2.2.16 showed high potassium at  5.6. Per Dr. Yong Channel: Your CMET (kidney, liver, and electrolytes, blood sugar) other than potassium was largely normal. You need to come back for a repeat BMP to make sure you potassium in normal.  Pt came back on 2.5.16 and her potassium was 5.3. Per Dr. Yong Channel: Potassium still slightly high. See if your multivitamin has potassium in it and let me know how much. If it does not have any, we will need to stop the lisinopril and see you back next week for BP recheck and consider alternatives. Pt has checked her multivitamin and it has 80 mg of potassium in it.  Pt states she has checked all the foods she eats and they ar all high in potassium.  Pt already had her pill box fixed for the week and has been continuing to take the Lisinopril 20 mg and she has not picked up the 10 mg.  Please advise.

## 2014-06-15 NOTE — Telephone Encounter (Signed)
[facility] Night - Client TELEPHONE [facility] Patient Name: Destiny Acosta Gender: Female DOB: 04-Apr-1946 Age: 69 Y 21 M 24 D Return Phone Number: [phone] (Primary) Address: [address] City/State/Zip: [place] [address] Client [facility] Night - Client Client Site [facility] - Night Physician Garret Reddish Contact Type Call Call Type Triage / Clinical Relationship To Patient Self Return Phone Number [phone] (Primary) Chief Complaint Unclassified Symptom Initial Comment Caller states she is having a potassium level; has questions, Nurse Assessment Nurse: Wynetta Emery, RN, Baker Janus Date/Time (Eastern Time): 06/15/2014 9:23:13 AM Confirm and document reason for call. If symptomatic, describe symptoms. ---Seen MD last week labs drawn; received call from MD stating her potassium is high needs to repeat test Test repeated and still a little high MD wants to know how much potassium is in her MVI -- it is 80mg  she states she got to looking and all the foods she eats is high in potassium. should she stop taking her lisinopril have not started taking 10mg  yet was finishing her pill box out . Has the patient traveled out of the country within the last 30 days? ---No Does the patient require triage? ---No Please document clinical information provided and list any resource used. ---Nurse will call the MD on call Guidelines Guideline Title Affirmed Question Affirmed Notes Nurse Date/Time ([place] Time) Disp. Time Eilene Ghazi Time) Disposition Final User 06/15/2014 9:50:15 AM Paged On Call back to [facility], RN, Baker Janus Reason: called the pager and was answered by the Nurse at [facility] she will take message and get it to the MD 06/15/2014 9:50:46 AM Clinical Call Yes Wynetta Emery, RN, Baker Janus After Care Instructions Given Call Event Type User Date / Time Description Comments User: Michele Rockers, RN Date/Time Eilene Ghazi Time): 06/15/2014 9:49:09 AM PLEASE NOTE: All timestamps contained within this report are represented as [place] Standard Time. CONFIDENTIALTY NOTICE: This fax transmission is intended only for the addressee. It contains information that is legally privileged, confidential or otherwise protected from use or disclosure. If you are not the intended recipient, you are strictly prohibited from reviewing, disclosing, copying using or disseminating any of this information or taking any action in reliance on or regarding this information. If you have received this fax in error, please notify us immediately by telephone so that we can arrange for its return to [place]. Phone: [phone], Toll-Free: [phone], Fax: [phone] Page: 2 of 2 Call Id: [id number] Comments Called MD and spoke to Nurse she took information down and MD will call her back.

## 2014-06-17 ENCOUNTER — Encounter: Payer: Self-pay | Admitting: Family Medicine

## 2014-06-17 NOTE — Telephone Encounter (Signed)
Spoke with pt on Saturday and she is aware.

## 2014-06-19 ENCOUNTER — Ambulatory Visit (INDEPENDENT_AMBULATORY_CARE_PROVIDER_SITE_OTHER): Payer: Medicare Other | Admitting: Family Medicine

## 2014-06-19 ENCOUNTER — Encounter: Payer: Self-pay | Admitting: Family Medicine

## 2014-06-19 VITALS — BP 130/82 | Temp 97.6°F | Wt 153.0 lb

## 2014-06-19 DIAGNOSIS — E875 Hyperkalemia: Secondary | ICD-10-CM

## 2014-06-19 DIAGNOSIS — I10 Essential (primary) hypertension: Secondary | ICD-10-CM

## 2014-06-19 LAB — BASIC METABOLIC PANEL
BUN: 14 mg/dL (ref 6–23)
CALCIUM: 9.4 mg/dL (ref 8.4–10.5)
CO2: 30 mEq/L (ref 19–32)
Chloride: 100 mEq/L (ref 96–112)
Creatinine, Ser: 0.57 mg/dL (ref 0.40–1.20)
GFR: 111.56 mL/min (ref >60.00)
Glucose, Bld: 96 mg/dL (ref 70–99)
Potassium: 4 mEq/L (ref 3.5–5.1)
SODIUM: 136 meq/L (ref 135–145)

## 2014-06-19 NOTE — Progress Notes (Signed)
  Garret Reddish, MD Phone: [phone]  Subjective:   Destiny Acosta is a 69 y.o. year old very pleasant female patient who presents with the following:  Hypertension-controlled on lisinopril 10mg .  Hyperkalemia-noted on last 2 sets of labs, patient had not yet decreased lisinopril on repeat BP Readings from Last 3 Encounters:  06/19/14 130/82  06/10/14 102/70  08/20/13 112/72  Sunday switched to 10mg  lisinopril Home BP monitoring-110/60 Compliant with medications-yes without side effects ROS-Denies any CP, HA, SOB, blurry vision, LE edema, loose stools about twice a month.  No palpitations.   Past Medical History- Patient Active Problem List   Diagnosis Date Noted  . Psoriatic arthritis 08/10/2011    Priority: Medium  . Mild aortic regurgitation 05/14/2010    Priority: Medium  . Hyperlipidemia 12/06/2006    Priority: Medium  . Hypertension 12/06/2006    Priority: Medium  . Allergic rhinitis 06/10/2014    Priority: Low  . Insomnia 06/10/2014    Priority: Low  . History of colonic polyps 12/21/2006    Priority: Low   Medications- reviewed and updated Current Outpatient Prescriptions  Medication Sig Dispense Refill  . Biotin 5000 MCG TABS Take 1 tablet by mouth daily.    . Calcium Carbonate-Vitamin D (CALTRATE 600+D) 600-400 MG-UNIT per tablet Take 1 tablet by mouth daily.      . fexofenadine (ALLEGRA) 180 MG tablet Take 180 mg by mouth daily as needed.     Marland Kitchen lisinopril (PRINIVIL,ZESTRIL) 10 MG tablet Take 1 tablet (10 mg total) by mouth daily. 90 tablet 3  . Magnesium 250 MG TABS Take 1 tablet by mouth 2 (two) times daily.      . Minoxidil 5 % FOAM Apply 1 application topically daily.    . Multiple Vitamin (MULTIVITAMIN) capsule Take 1 capsule by mouth daily.      Marland Kitchen conjugated estrogens (PREMARIN) vaginal cream Place 1 Applicatorful vaginally once a week.    . Doxylamine Succinate, Sleep, (UNISOM) 25 MG tablet Take 25 mg by mouth at bedtime as needed.      Marland Kitchen  ibuprofen (ADVIL,MOTRIN) 200 MG tablet Take 200 mg by mouth as needed. pain    . tretinoin (RETIN-A) 0.1 % cream Apply topically at bedtime.       No current facility-administered medications for this visit.    Objective: BP 130/82 mmHg  Temp(Src) 97.6 F (36.4 C)  Wt 153 lb (69.4 kg) Gen: NAD, resting comfortably CV: RRR no murmurs rubs or gallops Lungs: CTAB no crackles, wheeze, rhonchi Abdomen: soft/nontender/nondistended/normal bowel sounds.   Ext: no edema Skin: warm, dry, no rash  Neuro: grossly normal, moves all extremities  Assessment/Plan:  Hypertension Controlled on lisinopril 10mg . Reduced for controlled BP and hyperkalemia. Recheck potassium today, if elevated stop lisinopril and repeat bmet. If remains high, would consider other causes. She also cut her MV which reportedly had 80mg  potassium (not sure how this equates to meq)  Return precautions advised.   Orders Placed This Encounter  Procedures  . Basic metabolic panel    [place]

## 2014-06-19 NOTE — Patient Instructions (Signed)
Check potassium again, if remains high, would stop lisinopril. If normal range, no changes. If remains high after stopping, would consider further investigation of potassium as can have other causes but want to rule out simple causes first.

## 2014-06-19 NOTE — Assessment & Plan Note (Signed)
Controlled on lisinopril 10mg . Reduced for controlled BP and hyperkalemia. Recheck potassium today, if elevated stop lisinopril and repeat bmet. If remains high, would consider other causes. She also cut her MV which reportedly had 80mg  potassium (not sure how this equates to meq)

## 2014-07-25 DIAGNOSIS — N950 Postmenopausal bleeding: Secondary | ICD-10-CM | POA: Diagnosis not present

## 2014-07-25 DIAGNOSIS — N760 Acute vaginitis: Secondary | ICD-10-CM | POA: Diagnosis not present

## 2014-08-22 DIAGNOSIS — N950 Postmenopausal bleeding: Secondary | ICD-10-CM | POA: Diagnosis not present

## 2014-09-10 ENCOUNTER — Ambulatory Visit: Payer: Medicare Other | Admitting: Family Medicine

## 2014-09-17 ENCOUNTER — Ambulatory Visit (INDEPENDENT_AMBULATORY_CARE_PROVIDER_SITE_OTHER): Payer: Medicare Other | Admitting: Family Medicine

## 2014-09-17 ENCOUNTER — Encounter: Payer: Self-pay | Admitting: Family Medicine

## 2014-09-17 VITALS — BP 122/72 | HR 81 | Temp 98.1°F | Wt 151.0 lb

## 2014-09-17 DIAGNOSIS — R102 Pelvic and perineal pain: Secondary | ICD-10-CM | POA: Insufficient documentation

## 2014-09-17 DIAGNOSIS — R1030 Lower abdominal pain, unspecified: Secondary | ICD-10-CM | POA: Diagnosis not present

## 2014-09-17 DIAGNOSIS — I10 Essential (primary) hypertension: Secondary | ICD-10-CM | POA: Diagnosis not present

## 2014-09-17 NOTE — Assessment & Plan Note (Signed)
See HPI 09/17/14. Summary 69 year old with at least 4 years intermittent suprapubic discomfort usually with WBC on UA but rarely true UTI on culture with family history in aunt and uncle with bladder cancer (they were smokers and she is a nonsmoker) as well as history of similar symptoms in 20s requiring cystoscope. Discussed would strongly consider urology referral for consideration cystoscope but patient declines at this time. Declines Urine culture today as well.

## 2014-09-17 NOTE — Progress Notes (Signed)
Garret Reddish, MD  Subjective:  Destiny Acosta is a 69 y.o. year old very pleasant female patient who presents with:  Hypertension-controlled on lisinopril 10mg , also tolerating asa 81mg   BP Readings from Last 3 Encounters:  09/17/14 122/72  06/19/14 130/82  06/10/14 102/70   Home BP monitoring-no Compliant with medications-yes without side effects ROS-Denies any CP, HA, SOB, blurry vision, LE edema. Flushing intermittent may be every few weeks, no increasing issues  Suprapubic discomfort- newly reported, old issue followed by gyn States she feels irritation in her bladder, describes as "awareness" feeling in lower abdomen.  checks home tests and has elevated WBC, had an old antibiotic from ob/gyn Dr. Philis Pique. Azo tends to help. Then goes away in a day or two. Worse with laying on side in bed, better when on back. Ranges from every 2 weeks to every 6 months. Noting it right now. Dr. Philis Pique has done u/s and has been normal. No prolapse on exam. Going on for at least 4-5 years. Cystoscope in 20s for similar symptoms- no findings at that time.   Aunt and uncle with bladder cancer former smoker  ROS- no polyuria, dysuria, nocturia  Past Medical History- HLD, mild aortic regurg, arthritis psoriatic?  Medications- reviewed and updated Current Outpatient Prescriptions  Medication Sig Dispense Refill  . Biotin 5000 MCG TABS Take 1 tablet by mouth daily.    . Calcium Carbonate-Vitamin D (CALTRATE 600+D) 600-400 MG-UNIT per tablet Take 1 tablet by mouth daily.      Marland Kitchen conjugated estrogens (PREMARIN) vaginal cream Place 1 Applicatorful vaginally once a week.    . Doxylamine Succinate, Sleep, (UNISOM) 25 MG tablet Take 25 mg by mouth at bedtime as needed.      . fexofenadine (ALLEGRA) 180 MG tablet Take 180 mg by mouth daily as needed.     Marland Kitchen ibuprofen (ADVIL,MOTRIN) 200 MG tablet Take 200 mg by mouth as needed. pain    . lisinopril (PRINIVIL,ZESTRIL) 10 MG tablet Take 1 tablet (10 mg total) by  mouth daily. 90 tablet 3  . Magnesium 250 MG TABS Take 1 tablet by mouth 2 (two) times daily.      . Minoxidil 5 % FOAM Apply 1 application topically daily.    . Multiple Vitamin (MULTIVITAMIN) capsule Take 1 capsule by mouth daily.      Marland Kitchen tretinoin (RETIN-A) 0.1 % cream Apply topically at bedtime.       No current facility-administered medications for this visit.    Objective: BP 122/72 mmHg  Pulse 81  Temp(Src) 98.1 F (36.7 C)  Wt 151 lb (68.493 kg) Gen: NAD, resting comfortably CV: RRR no murmurs rubs or gallops Lungs: CTAB no crackles, wheeze, rhonchi Abdomen: soft/nontender except mild discomfort suprapubically/nondistended/normal bowel sounds.  Ext: no edema Skin: warm, dry, no rash Neuro: grossly normal, moves all extremities  Assessment/Plan:  Hypertension Controlled on lisinopril 10mg , continue current rx   Suprapubic discomfort See HPI 09/17/14. Summary 69 year old with at least 4 years intermittent suprapubic discomfort usually with WBC on UA but rarely true UTI on culture with family history in aunt and uncle with bladder cancer (they were smokers and she is a nonsmoker) as well as history of similar symptoms in 2s requiring cystoscope. Discussed would strongly consider urology referral for consideration cystoscope but patient declines at this time. Declines Urine culture today as well.    6-12 months.

## 2014-09-17 NOTE — Patient Instructions (Addendum)
No changes today  The longer this bladder issue lasts, the more likely I am to push for urology input. We will keep an eye on this for now. Either I or Dr. Philis Pique can refer you-just make sure I get a copy.   Glad aspirin is treating you well  BP looks great. No change

## 2014-09-17 NOTE — Assessment & Plan Note (Signed)
Controlled on lisinopril 10mg , continue current rx

## 2014-09-30 ENCOUNTER — Other Ambulatory Visit: Payer: Self-pay

## 2014-09-30 DIAGNOSIS — Z1231 Encounter for screening mammogram for malignant neoplasm of breast: Secondary | ICD-10-CM

## 2014-10-28 ENCOUNTER — Ambulatory Visit
Admission: RE | Admit: 2014-10-28 | Discharge: 2014-10-28 | Disposition: A | Discharge status: Home / Self Care | Payer: Medicare Other | Source: Ambulatory Visit

## 2014-10-28 DIAGNOSIS — Z1231 Encounter for screening mammogram for malignant neoplasm of breast: Secondary | ICD-10-CM

## 2014-10-28 LAB — HM MAMMOGRAPHY: HM MAMMO: NORMAL

## 2014-11-20 ENCOUNTER — Encounter: Payer: Self-pay | Admitting: Internal Medicine

## 2014-12-30 DIAGNOSIS — M159 Polyosteoarthritis, unspecified: Secondary | ICD-10-CM | POA: Diagnosis not present

## 2014-12-30 DIAGNOSIS — M154 Erosive (osteo)arthritis: Secondary | ICD-10-CM | POA: Diagnosis not present

## 2014-12-30 DIAGNOSIS — M19049 Primary osteoarthritis, unspecified hand: Secondary | ICD-10-CM | POA: Diagnosis not present

## 2015-01-02 DIAGNOSIS — N941 Dyspareunia: Secondary | ICD-10-CM | POA: Diagnosis not present

## 2015-01-02 DIAGNOSIS — N952 Postmenopausal atrophic vaginitis: Secondary | ICD-10-CM | POA: Diagnosis not present

## 2015-01-02 DIAGNOSIS — N393 Stress incontinence (female) (male): Secondary | ICD-10-CM | POA: Diagnosis not present

## 2015-01-09 DIAGNOSIS — L659 Nonscarring hair loss, unspecified: Secondary | ICD-10-CM | POA: Diagnosis not present

## 2015-01-16 DIAGNOSIS — H40003 Preglaucoma, unspecified, bilateral: Secondary | ICD-10-CM | POA: Diagnosis not present

## 2015-01-21 DIAGNOSIS — Z85828 Personal history of other malignant neoplasm of skin: Secondary | ICD-10-CM | POA: Diagnosis not present

## 2015-01-21 DIAGNOSIS — L659 Nonscarring hair loss, unspecified: Secondary | ICD-10-CM | POA: Diagnosis not present

## 2015-01-23 DIAGNOSIS — D3132 Benign neoplasm of left choroid: Secondary | ICD-10-CM | POA: Diagnosis not present

## 2015-01-23 DIAGNOSIS — H4011X1 Primary open-angle glaucoma, mild stage: Secondary | ICD-10-CM | POA: Diagnosis not present

## 2015-01-23 DIAGNOSIS — H25013 Cortical age-related cataract, bilateral: Secondary | ICD-10-CM | POA: Diagnosis not present

## 2015-02-06 ENCOUNTER — Ambulatory Visit (INDEPENDENT_AMBULATORY_CARE_PROVIDER_SITE_OTHER): Payer: Medicare Other | Admitting: Family Medicine

## 2015-02-06 DIAGNOSIS — Z23 Encounter for immunization: Secondary | ICD-10-CM

## 2015-02-06 DIAGNOSIS — N951 Menopausal and female climacteric states: Secondary | ICD-10-CM | POA: Diagnosis not present

## 2015-02-06 NOTE — Addendum Note (Signed)
Addended by: Clyde Lundborg A on: 02/06/2015 11:47 AM   Modules accepted: Orders

## 2015-02-10 ENCOUNTER — Encounter: Payer: Self-pay | Admitting: Family Medicine

## 2015-02-26 DIAGNOSIS — B351 Tinea unguium: Secondary | ICD-10-CM | POA: Diagnosis not present

## 2015-02-26 DIAGNOSIS — D3132 Benign neoplasm of left choroid: Secondary | ICD-10-CM | POA: Diagnosis not present

## 2015-02-26 DIAGNOSIS — H25013 Cortical age-related cataract, bilateral: Secondary | ICD-10-CM | POA: Diagnosis not present

## 2015-02-26 DIAGNOSIS — L658 Other specified nonscarring hair loss: Secondary | ICD-10-CM | POA: Diagnosis not present

## 2015-02-26 DIAGNOSIS — H401131 Primary open-angle glaucoma, bilateral, mild stage: Secondary | ICD-10-CM | POA: Diagnosis not present

## 2015-02-26 LAB — HM DIABETES EYE EXAM

## 2015-02-27 ENCOUNTER — Encounter: Payer: Self-pay | Admitting: Family Medicine

## 2015-03-11 ENCOUNTER — Ambulatory Visit: Payer: Medicare Other | Admitting: Family Medicine

## 2015-04-16 ENCOUNTER — Ambulatory Visit (INDEPENDENT_AMBULATORY_CARE_PROVIDER_SITE_OTHER): Payer: Medicare Other | Admitting: Family Medicine

## 2015-04-16 ENCOUNTER — Encounter: Payer: Self-pay | Admitting: Family Medicine

## 2015-04-16 VITALS — BP 116/70 | HR 78 | Temp 97.6°F | Wt 152.0 lb

## 2015-04-16 DIAGNOSIS — Z1211 Encounter for screening for malignant neoplasm of colon: Secondary | ICD-10-CM | POA: Diagnosis not present

## 2015-04-16 DIAGNOSIS — E785 Hyperlipidemia, unspecified: Secondary | ICD-10-CM

## 2015-04-16 DIAGNOSIS — H409 Unspecified glaucoma: Secondary | ICD-10-CM | POA: Insufficient documentation

## 2015-04-16 DIAGNOSIS — L4050 Arthropathic psoriasis, unspecified: Secondary | ICD-10-CM

## 2015-04-16 DIAGNOSIS — I10 Essential (primary) hypertension: Secondary | ICD-10-CM

## 2015-04-16 DIAGNOSIS — R002 Palpitations: Secondary | ICD-10-CM | POA: Diagnosis not present

## 2015-04-16 DIAGNOSIS — Z20828 Contact with and (suspected) exposure to other viral communicable diseases: Secondary | ICD-10-CM

## 2015-04-16 NOTE — Assessment & Plan Note (Signed)
S: intermittent issues since she was 69 years old. May be twice a year but most recently 3x over 6 weeks which is more frequent than normal but has occurred before. Never exertional- usually at rest. No chest pain or shortness of breath or nausea or diaphoresis with episodes. She has had a rare occasion while resting of 1/10 upper chest pain aching that resolves on its own within a few minutes but has never had anything exertionally. Stressors lately- finance, sister and brother. History mild aortic regurgitation with echo within a year.  A/P: discussed that chest pain was noncardiac in nature (nonexertional, not relieved by rest, no other symptoms). I think palpitations and upper chest pain may bes tress related.  EKG cannot rule out MI/heart strain but EKG was reassuring. In regards to palpitations- she is worried about a fib. While possible, I do not think likely. She does not want to wear 30 day monitor and knows that is only way to detect. At age and with family history could stress test her but she wants to see if she is high risk based on labs first. i told her i had low suspicion for cardiac disease and labs unlikely to change that. She also asked my opinion about reestablishing with cards- I offered to refer if she would feel more comfortable but she declines for now. Will call if changes mind.

## 2015-04-16 NOTE — Assessment & Plan Note (Signed)
S:Memory loss on lipitor. No current rx. Mom CABG at 68, weight 200-250, diabetic.  A/P: check fasting lipids. If 10 year risk >10% consider statin retrial perhaps low dose crestor.

## 2015-04-16 NOTE — Assessment & Plan Note (Signed)
S:Likely psoriatic arthritis per rheum Dr. Ouida Sills. Dr. Verlene Mayer at [facility] says not psoriatic. Regardless at present patient taking aleve once or twice a day and largely controls symptoms. Dr. Jethro Poling asked that she have every 6 months CBC, CMET A/P: check labs today given chronic aleve use

## 2015-04-16 NOTE — Assessment & Plan Note (Signed)
S: controlled. On lisinopril 10mg   BP Readings from Last 3 Encounters:  04/16/15 116/70  09/17/14 122/72  06/19/14 130/82  A/P:Continue current meds:  No change

## 2015-04-16 NOTE — Progress Notes (Signed)
Garret Reddish, MD  Subjective:  Destiny Acosta is a 69 y.o. year old very pleasant female patient who presents for/with See problem oriented charting ROS- no shortness of breath, nausea, vomiting, abdominal pain, diaphoresis  Past Medical History-  Patient Active Problem List   Diagnosis Date Noted  . Suprapubic discomfort 09/17/2014    Priority: Medium  . Psoriatic arthritis ([place]) 08/10/2011    Priority: Medium  . Mild aortic regurgitation 05/14/2010    Priority: Medium  . Hyperlipidemia 12/06/2006    Priority: Medium  . Hypertension 12/06/2006    Priority: Medium  . Glaucoma 04/16/2015    Priority: Low  . Palpitations 04/16/2015    Priority: Low  . Allergic rhinitis 06/10/2014    Priority: Low  . Insomnia 06/10/2014    Priority: Low  . History of colonic polyps 12/21/2006    Priority: Low    Medications- reviewed and updated Current Outpatient Prescriptions  Medication Sig Dispense Refill  . Biotin 5000 MCG TABS Take 1 tablet by mouth daily.    . Calcium Carbonate-Vitamin D (CALTRATE 600+D) 600-400 MG-UNIT per tablet Take 1 tablet by mouth daily.      Marland Kitchen conjugated estrogens (PREMARIN) vaginal cream Place 1 Applicatorful vaginally once a week.    . Doxylamine Succinate, Sleep, (UNISOM) 25 MG tablet Take 25 mg by mouth at bedtime as needed.      . fexofenadine (ALLEGRA) 180 MG tablet Take 180 mg by mouth daily as needed.     . finasteride (PROSCAR) 5 MG tablet Take 5 mg by mouth daily.    Marland Kitchen ibuprofen (ADVIL,MOTRIN) 200 MG tablet Take 200 mg by mouth as needed. pain    . lisinopril (PRINIVIL,ZESTRIL) 10 MG tablet Take 1 tablet (10 mg total) by mouth daily. 90 tablet 3  . Magnesium 250 MG TABS Take 1 tablet by mouth 2 (two) times daily.      . Minoxidil 5 % FOAM Apply 1 application topically daily.    . Multiple Vitamin (MULTIVITAMIN) capsule Take 1 capsule by mouth daily.      Marland Kitchen tretinoin (RETIN-A) 0.1 % cream Apply topically at bedtime.       No current  facility-administered medications for this visit.    Objective: BP 116/70 mmHg  Pulse 78  Temp(Src) 97.6 F (36.4 C)  Wt 152 lb (68.947 kg) Gen: NAD, resting comfortably CV: 3/6 diastolic murmur rubs or gallops Lungs: CTAB no crackles, wheeze, rhonchi Abdomen: soft/nontender/nondistended/normal bowel sounds. No rebound or guarding.  Ext: no edema Skin: warm, dry Neuro: grossly normal, moves all extremities  EKG: sinus rate 61, normal intervals, no hypertrophy. No st or t wave changes. Normal ekg.   Assessment/Plan:  Psoriatic arthritis S:Likely psoriatic arthritis per rheum Dr. Ouida Sills. Dr. Verlene Mayer at [facility] says not psoriatic. Regardless at present patient taking aleve once or twice a day and largely controls symptoms. Dr. Jethro Poling asked that she have every 6 months CBC, CMET A/P: check labs today given chronic aleve use   Hypertension S: controlled. On lisinopril 10mg   BP Readings from Last 3 Encounters:  04/16/15 116/70  09/17/14 122/72  06/19/14 130/82  A/P:Continue current meds:  No change   Hyperlipidemia S:Memory loss on lipitor. No current rx. Mom CABG at 37, weight 200-250, diabetic.  A/P: check fasting lipids. If 10 year risk >10% consider statin retrial perhaps low dose crestor.    Palpitations S: intermittent issues since she was 69 years old. May be twice a year but most recently 3x over 6  weeks which is more frequent than normal but has occurred before. Never exertional- usually at rest. No chest pain or shortness of breath or nausea or diaphoresis with episodes. She has had a rare occasion while resting of 1/10 upper chest pain aching that resolves on its own within a few minutes but has never had anything exertionally. Stressors lately- finance, sister and brother. History mild aortic regurgitation with echo within a year.  A/P: discussed that chest pain was noncardiac in nature (nonexertional, not relieved by rest, no other symptoms). I think  palpitations and upper chest pain may bes tress related.  EKG cannot rule out MI/heart strain but EKG was reassuring. In regards to palpitations- she is worried about a fib. While possible, I do not think likely. She does not want to wear 30 day monitor and knows that is only way to detect. At age and with family history could stress test her but she wants to see if she is high risk based on labs first. i told her i had low suspicion for cardiac disease and labs unlikely to change that. She also asked my opinion about reestablishing with cards- I offered to refer if she would feel more comfortable but she declines for now. Will call if changes mind.    Chronic issues with constipation. Colonoscopy planned next year but patient wants to move up due to constipation (though not recent change). We opted for stool cards and colonoscopy only if positive.   6 months planned unless needed Return precautions advised.   Future fasting Orders Placed This Encounter  Procedures  . LDL cholesterol, direct    [place]    Standing Status: Future     Number of Occurrences:      Standing Expiration Date: 04/15/2016  . Cholesterol, Total    Standing Status: Future     Number of Occurrences:      Standing Expiration Date: 04/15/2016  . HDL cholesterol    Standing Status: Future     Number of Occurrences:      Standing Expiration Date: 04/15/2016  . CBC    [place]    Standing Status: Future     Number of Occurrences:      Standing Expiration Date: 04/15/2016  . Comprehensive metabolic panel    [place]    Standing Status: Future     Number of Occurrences:      Standing Expiration Date: 04/15/2016  . Hepatitis C antibody, reflex    [facility]    Standing Status: Future     Number of Occurrences:      Standing Expiration Date: 04/15/2016  . POC Hemoccult Bld/Stl (3-Cd Home Screen)    [facility]    Standing Status: Future     Number of Occurrences:      Standing Expiration Date: 04/15/2016  . EKG 12-Lead

## 2015-04-16 NOTE — Patient Instructions (Addendum)
Schedule a lab visit at the front desk. Return for future fasting labs. Nothing but water after midnight please.   Pick up stool cards in the lab and bring them by at the time of labs  We will consider cholesterol medication and further testing such as stress test or cardiology referral though honestly I suspect i will hedge toward doing neither.   EKG looks great

## 2015-04-28 ENCOUNTER — Other Ambulatory Visit: Payer: Self-pay | Admitting: Family Medicine

## 2015-04-29 ENCOUNTER — Other Ambulatory Visit: Payer: Medicare Other

## 2015-05-07 DIAGNOSIS — M150 Primary generalized (osteo)arthritis: Secondary | ICD-10-CM | POA: Diagnosis not present

## 2015-05-07 DIAGNOSIS — M2550 Pain in unspecified joint: Secondary | ICD-10-CM | POA: Diagnosis not present

## 2015-05-07 DIAGNOSIS — M1990 Unspecified osteoarthritis, unspecified site: Secondary | ICD-10-CM | POA: Diagnosis not present

## 2015-05-09 ENCOUNTER — Other Ambulatory Visit (INDEPENDENT_AMBULATORY_CARE_PROVIDER_SITE_OTHER): Payer: Medicare Other

## 2015-05-09 ENCOUNTER — Other Ambulatory Visit: Payer: Self-pay | Admitting: Family Medicine

## 2015-05-09 DIAGNOSIS — Z20828 Contact with and (suspected) exposure to other viral communicable diseases: Secondary | ICD-10-CM | POA: Diagnosis not present

## 2015-05-09 DIAGNOSIS — Z1211 Encounter for screening for malignant neoplasm of colon: Secondary | ICD-10-CM

## 2015-05-09 DIAGNOSIS — I10 Essential (primary) hypertension: Secondary | ICD-10-CM | POA: Diagnosis not present

## 2015-05-09 DIAGNOSIS — E785 Hyperlipidemia, unspecified: Secondary | ICD-10-CM | POA: Diagnosis not present

## 2015-05-09 LAB — COMPREHENSIVE METABOLIC PANEL
ALT: 21 U/L (ref 0–35)
AST: 23 U/L (ref 0–37)
Albumin: 4.2 g/dL (ref 3.5–5.2)
Alkaline Phosphatase: 61 U/L (ref 39–117)
BILIRUBIN TOTAL: 1.1 mg/dL (ref 0.2–1.2)
BUN: 14 mg/dL (ref 6–23)
CHLORIDE: 101 meq/L (ref 96–112)
CO2: 27 meq/L (ref 19–32)
Calcium: 9.6 mg/dL (ref 8.4–10.5)
Creatinine, Ser: 0.58 mg/dL (ref 0.40–1.20)
GFR: 109.06 mL/min (ref >60.00)
GLUCOSE: 83 mg/dL (ref 70–99)
Potassium: 4.2 mEq/L (ref 3.5–5.1)
Sodium: 138 mEq/L (ref 135–145)
Total Protein: 7.2 g/dL (ref 6.0–8.3)

## 2015-05-09 LAB — CBC
HCT: 39.3 % (ref 36.0–46.0)
HEMOGLOBIN: 12.9 g/dL (ref 12.0–15.0)
MCHC: 32.9 g/dL (ref 30.0–36.0)
MCV: 91.4 fl (ref 78.0–100.0)
Platelets: 302 10*3/uL (ref 150.0–400.0)
RBC: 4.3 Mil/uL (ref 3.87–5.11)
RDW: 13.8 % (ref 11.5–15.5)
WBC: 3.8 10*3/uL — ABNORMAL LOW (ref 4.0–10.5)

## 2015-05-09 LAB — HDL CHOLESTEROL: HDL: 64.3 mg/dL (ref >39.00)

## 2015-05-09 LAB — CHOLESTEROL, TOTAL: Cholesterol: 194 mg/dL (ref 0–200)

## 2015-05-09 LAB — LDL CHOLESTEROL, DIRECT: Direct LDL: 117 mg/dL

## 2015-05-10 LAB — HEPATITIS C ANTIBODY: HCV Ab: NEGATIVE

## 2015-05-11 HISTORY — PX: COLONOSCOPY: SHX174

## 2015-05-11 HISTORY — PX: POLYPECTOMY: SHX149

## 2015-06-09 DIAGNOSIS — D3132 Benign neoplasm of left choroid: Secondary | ICD-10-CM | POA: Diagnosis not present

## 2015-06-09 DIAGNOSIS — H401131 Primary open-angle glaucoma, bilateral, mild stage: Secondary | ICD-10-CM | POA: Diagnosis not present

## 2015-06-09 DIAGNOSIS — H25013 Cortical age-related cataract, bilateral: Secondary | ICD-10-CM | POA: Diagnosis not present

## 2015-06-11 ENCOUNTER — Other Ambulatory Visit (INDEPENDENT_AMBULATORY_CARE_PROVIDER_SITE_OTHER): Payer: Medicare Other

## 2015-06-11 DIAGNOSIS — Z1211 Encounter for screening for malignant neoplasm of colon: Secondary | ICD-10-CM | POA: Diagnosis not present

## 2015-06-11 DIAGNOSIS — Z20828 Contact with and (suspected) exposure to other viral communicable diseases: Secondary | ICD-10-CM | POA: Diagnosis not present

## 2015-06-11 LAB — POC HEMOCCULT BLD/STL (OFFICE/1-CARD/DIAGNOSTIC): Fecal Occult Blood, POC: NEGATIVE

## 2015-06-20 ENCOUNTER — Telehealth: Payer: Self-pay | Admitting: Family Medicine

## 2015-06-20 MED ORDER — LISINOPRIL 10 MG PO TABS: 10.0000 mg | ORAL_TABLET | Freq: Every day | ORAL | Status: DC

## 2015-06-20 NOTE — Telephone Encounter (Signed)
Pt has changed insurance companies and has a new pharm. Pt needs her lisinopril (PRINIVIL,ZESTRIL) 10 MG tablet  (Pt has refills on her meds.)  Pt would like new rx sent to [facility] Pt has made cpe for June 2017

## 2015-06-20 NOTE — Telephone Encounter (Signed)
Rx done. 

## 2015-08-14 DIAGNOSIS — M545 Low back pain: Secondary | ICD-10-CM | POA: Diagnosis not present

## 2015-08-14 DIAGNOSIS — G8929 Other chronic pain: Secondary | ICD-10-CM | POA: Diagnosis not present

## 2015-08-14 DIAGNOSIS — R102 Pelvic and perineal pain: Secondary | ICD-10-CM | POA: Diagnosis not present

## 2015-08-14 DIAGNOSIS — M1990 Unspecified osteoarthritis, unspecified site: Secondary | ICD-10-CM | POA: Diagnosis not present

## 2015-08-14 DIAGNOSIS — M150 Primary generalized (osteo)arthritis: Secondary | ICD-10-CM | POA: Diagnosis not present

## 2015-08-14 DIAGNOSIS — M2550 Pain in unspecified joint: Secondary | ICD-10-CM | POA: Diagnosis not present

## 2015-08-14 DIAGNOSIS — S32000A Wedge compression fracture of unspecified lumbar vertebra, initial encounter for closed fracture: Secondary | ICD-10-CM | POA: Diagnosis not present

## 2015-08-15 ENCOUNTER — Other Ambulatory Visit: Payer: Self-pay | Admitting: Obstetrics and Gynecology

## 2015-08-15 DIAGNOSIS — R5381 Other malaise: Secondary | ICD-10-CM

## 2015-08-19 ENCOUNTER — Other Ambulatory Visit: Payer: Self-pay | Admitting: Internal Medicine

## 2015-08-19 DIAGNOSIS — S32000A Wedge compression fracture of unspecified lumbar vertebra, initial encounter for closed fracture: Secondary | ICD-10-CM

## 2015-08-20 ENCOUNTER — Other Ambulatory Visit: Payer: Self-pay | Admitting: Obstetrics and Gynecology

## 2015-08-20 DIAGNOSIS — E2839 Other primary ovarian failure: Secondary | ICD-10-CM

## 2015-08-22 ENCOUNTER — Ambulatory Visit
Admission: RE | Admit: 2015-08-22 | Discharge: 2015-08-22 | Disposition: A | Discharge status: Home / Self Care | Payer: Medicare Other | Source: Ambulatory Visit | Attending: Obstetrics and Gynecology | Admitting: Obstetrics and Gynecology

## 2015-08-22 DIAGNOSIS — Z780 Asymptomatic menopausal state: Secondary | ICD-10-CM | POA: Diagnosis not present

## 2015-08-22 DIAGNOSIS — M85851 Other specified disorders of bone density and structure, right thigh: Secondary | ICD-10-CM | POA: Diagnosis not present

## 2015-08-22 DIAGNOSIS — E2839 Other primary ovarian failure: Secondary | ICD-10-CM

## 2015-08-23 ENCOUNTER — Ambulatory Visit
Admission: RE | Admit: 2015-08-23 | Discharge: 2015-08-23 | Disposition: A | Discharge status: Home / Self Care | Payer: Medicare Other | Source: Ambulatory Visit | Attending: Internal Medicine | Admitting: Internal Medicine

## 2015-08-23 DIAGNOSIS — M545 Low back pain: Secondary | ICD-10-CM | POA: Diagnosis not present

## 2015-08-23 DIAGNOSIS — S32000A Wedge compression fracture of unspecified lumbar vertebra, initial encounter for closed fracture: Secondary | ICD-10-CM

## 2015-08-23 DIAGNOSIS — S3992XA Unspecified injury of lower back, initial encounter: Secondary | ICD-10-CM | POA: Diagnosis not present

## 2015-09-08 DIAGNOSIS — S32050A Wedge compression fracture of fifth lumbar vertebra, initial encounter for closed fracture: Secondary | ICD-10-CM | POA: Diagnosis not present

## 2015-09-08 DIAGNOSIS — M5416 Radiculopathy, lumbar region: Secondary | ICD-10-CM | POA: Diagnosis not present

## 2015-09-08 DIAGNOSIS — M545 Low back pain: Secondary | ICD-10-CM | POA: Diagnosis not present

## 2015-09-08 DIAGNOSIS — M4316 Spondylolisthesis, lumbar region: Secondary | ICD-10-CM | POA: Diagnosis not present

## 2015-09-08 DIAGNOSIS — Z6826 Body mass index (BMI) 26.0-26.9, adult: Secondary | ICD-10-CM | POA: Diagnosis not present

## 2015-09-08 DIAGNOSIS — M542 Cervicalgia: Secondary | ICD-10-CM | POA: Diagnosis not present

## 2015-09-09 ENCOUNTER — Other Ambulatory Visit: Payer: Self-pay

## 2015-09-09 DIAGNOSIS — Z1231 Encounter for screening mammogram for malignant neoplasm of breast: Secondary | ICD-10-CM

## 2015-09-16 DIAGNOSIS — M5416 Radiculopathy, lumbar region: Secondary | ICD-10-CM | POA: Diagnosis not present

## 2015-09-16 DIAGNOSIS — M4316 Spondylolisthesis, lumbar region: Secondary | ICD-10-CM | POA: Diagnosis not present

## 2015-09-16 DIAGNOSIS — M549 Dorsalgia, unspecified: Secondary | ICD-10-CM | POA: Diagnosis not present

## 2015-09-24 ENCOUNTER — Ambulatory Visit: Payer: Medicare Other | Attending: Neurosurgery | Admitting: Rehabilitative and Restorative Service Providers"

## 2015-09-24 ENCOUNTER — Encounter: Payer: Self-pay | Admitting: Rehabilitative and Restorative Service Providers"

## 2015-09-24 DIAGNOSIS — R29898 Other symptoms and signs involving the musculoskeletal system: Secondary | ICD-10-CM | POA: Insufficient documentation

## 2015-09-24 DIAGNOSIS — M545 Low back pain, unspecified: Secondary | ICD-10-CM

## 2015-09-24 NOTE — Patient Instructions (Signed)
Abdominal Bracing With Pelvic Floor (Hook-Lying) 3 part core     With neutral spine, tighten pelvic floor and abdominals sucking belly button to back bone; tighten muscles in low back at waist.  Hold 10 sec Repeat _10__ times. Do _several__ times a day. Progress to do this in sitting; standing; walking and with functional activities    HIP: Hamstrings - Supine   Place strap around foot. Raise leg up, keeping knee straight.  Bend opposite knee to protect back if indicated. Hold 30 seconds. 3 reps per set, 2-3 sets per day     Outer Hip Stretch: Reclined IT Band Stretch (Strap)   Strap around one foot, pull leg across body until you feel a pull or stretch, with shoulders on mat. Hold for 30 seconds. Repeat 3 times each leg. 2-3 times/day.  Piriformis Stretch   Lying on back, pull right knee toward opposite shoulder. Hold 30 seconds. Repeat 3 times. Do 2-3 sessions per day.   Quads / HF, Prone   Lie face down. Grasp one ankle with same-side hand. Use towel if needed to reach. Gently pull foot toward buttock.  Hold 30 seconds. Repeat 3 times per session. Do 2-3 sessions per day.   TENS UNIT: This is helpful for muscle pain and spasm.   Search and Purchase a TENS 7000 2nd edition at www.tenspros.com. It should be less than $30.     TENS unit instructions: Do not shower or bathe with the unit on Turn the unit off before removing electrodes or batteries If the electrodes lose stickiness add a drop of water to the electrodes after they are disconnected from the unit and place on plastic sheet. If you continued to have difficulty, call the TENS unit company to purchase more electrodes. Do not apply lotion on the skin area prior to use. Make sure the skin is clean and dry as this will help prolong the life of the electrodes. After use, always check skin for unusual red areas, rash or other skin difficulties. If there are any skin problems, does not apply electrodes to the  same area. Never remove the electrodes from the unit by pulling the wires. Do not use the TENS unit or electrodes other than as directed. Do not change electrode placement without consultating your therapist or physician. Keep 2 fingers with between each electrode.   Sleeping on Back  Place pillow under knees. A pillow with cervical support and a roll around waist are also helpful. Copyright  VHI. All rights reserved.  Sleeping on Side Place pillow between knees. Use cervical support under neck and a roll around waist as needed. Copyright  VHI. All rights reserved.   Sleeping on Stomach   If this is the only desirable sleeping position, place pillow under lower legs, and under stomach or chest as needed.  Posture - Sitting   Sit upright, head facing forward. Try using a roll to support lower back. Keep shoulders relaxed, and avoid rounded back. Keep hips level with knees. Avoid crossing legs for long periods. Stand to Sit / Sit to Stand   To sit: Bend knees to lower self onto front edge of chair, then scoot back on seat. To stand: Reverse sequence by placing one foot forward, and scoot to front of seat. Use rocking motion to stand up.   Work Height and Reach  Ideal work height is no more than 2 to 4 inches below elbow level when standing, and at elbow level when sitting. Reaching should be  limited to arm's length, with elbows slightly bent.  Bending  Bend at hips and knees, not back. Keep feet shoulder-width apart.    Posture - Standing   Good posture is important. Avoid slouching and forward head thrust. Maintain curve in low back and align ears over shoul- ders, hips over ankles.  Alternating Positions   Alternate tasks and change positions frequently to reduce fatigue and muscle tension. Take rest breaks. Computer Work   Position work to Programmer, multimedia. Use proper work and seat height. Keep shoulders back and down, wrists straight, and elbows at right angles. Use  chair that provides full back support. Add footrest and lumbar roll as needed.  Getting Into / Out of Car  Lower self onto seat, scoot back, then bring in one leg at a time. Reverse sequence to get out.  Dressing  Lie on back to pull socks or slacks over feet, or sit and bend leg while keeping back straight.    Housework - Sink  Place one foot on ledge of cabinet under sink when standing at sink for prolonged periods.   Pushing / Pulling  Pushing is preferable to pulling. Keep back in proper alignment, and use leg muscles to do the work.  Deep Squat   Squat and lift with both arms held against upper trunk. Tighten stomach muscles without holding breath. Use smooth movements to avoid jerking.  Avoid Twisting   Avoid twisting or bending back. Pivot around using foot movements, and bend at knees if needed when reaching for articles.  Carrying Luggage   Distribute weight evenly on both sides. Use a cart whenever possible. Do not twist trunk. Move body as a unit.   Lifting Principles .Maintain proper posture and head alignment. .Slide object as close as possible before lifting. .Move obstacles out of the way. .Test before lifting; ask for help if too heavy. .Tighten stomach muscles without holding breath. .Use smooth movements; do not jerk. .Use legs to do the work, and pivot with feet. .Distribute the work load symmetrically and close to the center of trunk. .Push instead of pull whenever possible.   Ask For Help   Ask for help and delegate to others when possible. Coordinate your movements when lifting together, and maintain the low back curve.  Log Roll   Lying on back, bend left knee and place left arm across chest. Roll all in one movement to the right. Reverse to roll to the left. Always move as one unit. Housework - Sweeping  Use long-handled equipment to avoid stooping.   Housework - Wiping  Position yourself as close as possible to reach work  surface. Avoid straining your back.  Laundry - Unloading Wash   To unload small items at bottom of washer, lift leg opposite to arm being used to reach.  Kinney close to area to be raked. Use arm movements to do the work. Keep back straight and avoid twisting.     Cart  When reaching into cart with one arm, lift opposite leg to keep back straight.   Getting Into / Out of Bed  Lower self to lie down on one side by raising legs and lowering head at the same time. Use arms to assist moving without twisting. Bend both knees to roll onto back if desired. To sit up, start from lying on side, and use same move-ments in reverse. Housework - Vacuuming  Hold the vacuum with arm held at side. Step back and forth to  move it, keeping head up. Avoid twisting.   Laundry - IT consultant so that bending and twisting can be avoided.   Laundry - Unloading Dryer  Squat down to reach into clothes dryer or use a reacher.  Gardening - Weeding / Probation officer or Kneel. Knee pads may be helpful.                    Stretch out strap

## 2015-09-24 NOTE — Therapy (Signed)
[facility] [address] [place], [place], [address] Phone: [phone]   Fax:  [phone]  Physical Therapy Evaluation  Patient Details  Name: Destiny Acosta MRN: [id number] Date of Birth: 26-Sep-1945 Referring Provider: Dr. Peggyann Shoals   Encounter Date: 09/24/2015      PT End of Session - 09/24/15 1452    Visit Number 1   Number of Visits 12   Date for PT Re-Evaluation 11/05/15   PT Start Time [id number]   PT Stop Time 1538   PT Time Calculation (min) 53 min   Activity Tolerance Patient tolerated treatment well      Past Medical History  Diagnosis Date  . HYPERLIPIDEMIA 12/06/2006  . HYPERTENSION 12/06/2006  . MITRAL VALVE PROLAPSE 12/06/2006  . GERD 12/06/2006  . Aortal stenosis 05/14/2010  . Allergy     Eggplant/KIWI  . Heart murmur     aortic valve  . Hyperplastic colon polyp     Past Surgical History  Procedure Laterality Date  . Tubal ligation      There were no vitals filed for this visit.       Subjective Assessment - 09/24/15 1453    Subjective Patient reports that she has been having LBP and into the hip area over the past 2 months. She remembers that she fell water skiing 10/16. She had pain in the LB for 2-3 months and then improved. She has recurrent pain in the LB in January and some intermittemt symptoms in the LB. Interested in pllates and both neurosurgeons mentioned pilates.    Pertinent History Arthritis in hands and multiple joints including neck; upper back and LB   How long can you sit comfortably? 10-60 or more minutes    How long can you stand comfortably? 10 min to an hour    How long can you walk comfortably? walking 3 miles without pain    Diagnostic tests MRI - severe stenosis L4-5; chronic L5 compression deformity with slight retropulsion; annular buldging and posterior element hypertrophy; bilat L5/S1 nerve root impingement   Currently in Pain? Yes   Pain Score 4    Pain  Location Back   Pain Orientation Lower;Medial  buttock as low as you can go - the tail bone    Pain Descriptors / Indicators Numbness;Dull;Nagging  can be sharp    Pain Type Acute pain   Pain Radiating Towards LB into the low center buttocks   Pain Onset More than a month ago   Pain Frequency Intermittent   Aggravating Factors  prolonged sitting or standing; lying flat on back; putting pressure directly on back; has limited any lifting or bending; ascending steps at time    Pain Relieving Factors changing positions; aleve at times;             [facility] PT Assessment - 09/24/15 0001    Assessment   Medical Diagnosis LBP    Referring Provider Dr. Peggyann Shoals    Onset Date/Surgical Date 02/08/15   Hand Dominance Right   Next MD Visit no return scheduled    Prior Therapy none   Precautions   Precautions None   Precaution Comments listen to her body and give it time to heal    Balance Screen   Has the patient fallen in the past 6 months No   Has the patient had a decrease in activity level because of a fear of falling?  No   Is the patient reluctant  to leave their home because of a fear of falling?  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    Leisure household chores; laundry and cooking; walking 3 miles/day; holla hoop;    Observation/Other Assessments   Focus on Therapeutic Outcomes (FOTO)  41% limitation    Sensation   Additional Comments numbness and dull ache in tail bone    Posture/Postural Control   Posture Comments sits shifted off to one side or the other - does not sit with equal weight bearing; standing - hip shifted to the Lt and trunk shifted to Rt; head forward; shoulders rounded and elevated; increased thoracic kyphosis    AROM   Overall AROM Comments some end range stiffness bialt hips    Lumbar Flexion 85% of available range; pulling   Lumbar Extension 10% with discomfort in LB   Lumbar - Right Side Bend 605 pulling and pain in the the Lt back area    Lumbar - Left Side Bend 65%   Lumbar - Right Rotation 20%    Lumbar - Left Rotation 20% tightness    Strength   Overall Strength Comments 5/5 bilat LE's except hip extension 4+/5 Rt; 4/5 Lt and Lt hip abd 4+/5    Flexibility   Hamstrings 70 deg bilat    Quadriceps tight at ~100 deg knee flex in prone with pulling through quads    ITB tight bilat    Piriformis tight bilat    Palpation   SI assessment  pain and hypomobility through lumbar spine with CPA mobs    Palpation comment significant tenderness at coccyx and lateral to coccyx Rt > Lt; some tightness in Rt > Lt hip musculature including piriformis and hip abductors                   [facility] Adult PT Treatment/Exercise - 09/24/15 0001    Self-Care   Self-Care --  discussed body mechanics; transfers; sitting posture    Lumbar Exercises: Stretches   Passive Hamstring Stretch 3 reps;30 seconds   Quad Stretch 3 reps;30 seconds   ITB Stretch 2 reps;30 seconds   Piriformis Stretch 2 reps;30 seconds  travell stretch    Lumbar Exercises: Supine   Ab Set --  3 part core 10 sec x 10 - sub max contraction to avoid LBP                 PT Education - 09/24/15 1543    Education provided Yes   Education Details HEP; TENS unit    Person(s) Educated Patient   Methods Explanation;Demonstration;Tactile cues;Verbal cues;Handout   Comprehension Verbalized understanding;Returned demonstration;Verbal cues required;Tactile cues required             PT Long Term Goals - 09/24/15 1619    PT LONG TERM GOAL #1   Title Improve LE mobility and tissue extensibility 11/05/15   Time 6   Period Weeks   Status New   PT LONG TERM GOAL #2   Title Decrease pain in coccyx by 50-75% allowing patient  to participate in functional activities with minimal limitations 11/05/15   Time 6   Period Weeks   Status New   PT LONG TERM GOAL #3   Title Improve core stabilization progressing  patient to home exercise program to assist in restoring flexibility and strength bilat LE's 11/05/15   Time 6   Period Weeks   Status New   PT LONG TERM GOAL #4   Title Independent in HEP 11/05/15   Time 6   Period Weeks   Status New   PT LONG TERM GOAL #5   Title Improve FOTO to </= 29% limitation 11/05/15   Time 6   Period Weeks   Status New               Plan - 09/24/15 1603    Clinical Impression Statement Patient presents with ~ 8 month history of LBP initially starting when she fell while water skiing. She had some resolution of symptoms after 2-3 months but them symptoms returned. Primary c/o is coccyx pain and stiffness and discomfort in the back and hips. She has poor posture and alignment with elevated Lt hip and lateral flexion of the trunk to the Rt. Patient has limited trunk mobility and tightness through the hips and hip musculature. There is tenderness and pain with palpation through the coccyx and lumbar spine as well as tightness through the hip musculature. There is no radicular pain into either  LE. Patient will benefit from further assessment of coccyx pain and addressing hip mobility and LE tissue extensibility as well as core stabilization with PT.    Rehab Potential Good   PT Frequency 2x / week   PT Duration 6 weeks   PT Treatment/Interventions Patient/family education;ADLs/Self Care Home Management;Neuromuscular re-education;Dry needling;Manual techniques;Cryotherapy;Electrical Stimulation;Iontophoresis 4mg /ml Dexamethasone;Moist Heat;Ultrasound;Therapeutic activities;Therapeutic exercise   PT Next Visit Plan further assessment of coccyx pain; progress with core stabilization and stretching; work on spine care education; modalities and manual work as indicated    PT [place] and Agree with Plan of Care Patient      Patient will benefit from skilled therapeutic intervention in order to improve the following deficits and impairments:  Postural dysfunction, Improper body mechanics, Pain, Decreased range of motion, Decreased mobility, Decreased strength, Decreased endurance, Decreased activity tolerance, Increased fascial restricitons, Hypomobility  Visit Diagnosis: Midline low back pain without sciatica - Plan: PT plan of care cert/re-cert  Other symptoms and signs involving the musculoskeletal system - Plan: PT plan of care cert/re-cert      G-Codes - [id number]    Functional Assessment Tool Used Clinical evaluation; FOTO; assessment    Functional Limitation Mobility: Walking and moving around   Mobility: Walking and Moving Around Current Status [phone]) At least 40 percent but less than 60 percent impaired, limited or restricted   Mobility: Walking and Moving Around Goal Status [phone]) At least 20 percent but less than 40 percent impaired, limited or restricted       Problem List Patient Active Problem List   Diagnosis Date Noted  . Glaucoma 04/16/2015  . Palpitations 04/16/2015  . Suprapubic discomfort 09/17/2014  . Allergic rhinitis 06/10/2014  . Insomnia 06/10/2014  . Psoriatic arthritis ([place]) 08/10/2011  . Mild aortic regurgitation 05/14/2010  . History of colonic polyps 12/21/2006  . Hyperlipidemia 12/06/2006  . Hypertension 12/06/2006    Ernest Popowski Nilda Simmer PT, MPH  09/24/2015, 4:31 PM  [facility] [address], [place], [place], [address] Phone: [phone]   Fax:  [phone]  Name: DAWNELL POH MRN: [id number] Date of Birth: 19-Jan-1946

## 2015-09-30 ENCOUNTER — Encounter: Payer: Self-pay | Admitting: Physical Therapy

## 2015-09-30 ENCOUNTER — Ambulatory Visit: Payer: Medicare Other | Admitting: Physical Therapy

## 2015-09-30 DIAGNOSIS — M545 Low back pain, unspecified: Secondary | ICD-10-CM

## 2015-09-30 DIAGNOSIS — R29898 Other symptoms and signs involving the musculoskeletal system: Secondary | ICD-10-CM

## 2015-09-30 NOTE — Therapy (Signed)
[facility] [address] [place], [place], [address] Phone: [phone]   Fax:  [phone]  Physical Therapy Treatment  Patient Details  Name: Destiny Acosta MRN: [id number] Date of Birth: 05/12/1945 Referring Provider: Dr. Peggyann Shoals   Encounter Date: 09/30/2015      PT End of Session - 09/30/15 1308    Visit Number 2   Number of Visits 12   Date for PT Re-Evaluation 11/05/15   PT Start Time 1228   PT Stop Time 1316   PT Time Calculation (min) 48 min   Activity Tolerance Patient tolerated treatment well   Behavior During Therapy [name] for tasks assessed/performed      Past Medical History  Diagnosis Date  . HYPERLIPIDEMIA 12/06/2006  . HYPERTENSION 12/06/2006  . MITRAL VALVE PROLAPSE 12/06/2006  . GERD 12/06/2006  . Aortal stenosis 05/14/2010  . Allergy     Eggplant/KIWI  . Heart murmur     aortic valve  . Hyperplastic colon polyp     Past Surgical History  Procedure Laterality Date  . Tubal ligation      There were no vitals filed for this visit.      Subjective Assessment - 09/30/15 1242    Subjective Patient reports that her pain is intermittend and no specific pattern, worse since the last 2 month. Pt was at [facility] and Dr Vertell Limber and they both recommended PILATES.    Pertinent History Oct 2016 fell while waterskiing. Arthritis in hands and multiple joints including neck; upper back and LB   How long can you sit comfortably? 10-60 or more minutes    How long can you stand comfortably? 10 min to an hour    How long can you walk comfortably? walking 3 miles without pain    Diagnostic tests MRI - severe stenosis L4-5; chronic L5 compression deformity with slight retropulsion; annular buldging and posterior element hypertrophy; bilat L5/S1 nerve root impingement   Currently in Pain? Yes  but with sitting & at end of day up to 5/10   Pain Score 1    Pain Location Back   Pain Orientation Lower   Pain  Descriptors / Indicators Dull;Nagging;Numbness   Pain Type Acute pain   Pain Onset More than a month ago   Pain Frequency Intermittent   Aggravating Factors  prolonged sitting or standing, lying flat on back: putting pressure directly on back, limited with lifting or bending,ascending steps.     Pain Relieving Factors changing positions, aleve at times   Multiple Pain Sites No                         [facility] Adult PT Treatment/Exercise - 09/30/15 0001    Self-Care   Self-Care --  answered pt's questions in regard to general ex's   Lumbar Exercises: Stretches   Quad Stretch 3 reps;30 seconds  contract relaxe x 5 each leg in prone   Lumbar Exercises: Aerobic   UBE (Upper Arm Bike) in standing x 6 min (3/3)   Lumbar Exercises: Standing   Other Standing Lumbar Exercises standing in anatomical psotition x 1 min for awarness of neutral posture  added isometric contraction x 10 with 5 sec hold   Moist Heat Therapy   Number Minutes Moist Heat 15 Minutes   Moist Heat Location Lumbar Spine;Hip  lumbar/left hip in prone  PT Education - 09/30/15 1307    Education provided Yes   Education Details CAT and Camel, childs pose   Person(s) Educated Patient   Methods Explanation;Demonstration;Handout   Comprehension Verbalized understanding;Returned demonstration;Verbal cues required;Tactile cues required             PT Long Term Goals - 09/30/15 1321    PT LONG TERM GOAL #1   Title Improve LE mobility and tissue extensibility 11/05/15   Time 6   Period Weeks   Status On-going   PT LONG TERM GOAL #2   Title Decrease pain in coccyx by 50-75% allowing patient to participate in functional activities with minimal limitations 11/05/15   Time 6   Period Weeks   Status On-going   PT LONG TERM GOAL #3   Title Improve core stabilization progressing patient to home exercise program to assist in restoring flexibility and strength bilat LE's 11/05/15   Time  6   Period Weeks   Status On-going   PT LONG TERM GOAL #4   Title Independent in HEP 11/05/15   Time 6   Period Weeks   Status On-going   PT LONG TERM GOAL #5   Title Improve FOTO to </= 29% limitation 11/05/15   Time 6   Period Weeks   Status On-going               Plan - 09/30/15 1309    Clinical Impression Statement Patient was able to asend steps with slight limp. Patient not able to sit to perform Nu-step - continue UBE in standing. Patient able to perform cat/camel with initial discomfort. Pt will continue to benefit from slkilled PT and introduction into Pilates.    Rehab Potential Good   PT Frequency 2x / week   PT Duration 6 weeks   PT Treatment/Interventions Patient/family education;ADLs/Self Care Home Management;Neuromuscular re-education;Dry needling;Manual techniques;Cryotherapy;Electrical Stimulation;Iontophoresis 4mg /ml Dexamethasone;Moist Heat;Ultrasound;Therapeutic activities;Therapeutic exercise   PT Next Visit Plan further assessment of coccyx pain; progress with core stabilization and stretching; work on spine care education; modalities and manual work as indicated    PT Home Exercise Plan add pilates   Consulted and Agree with Plan of Care Patient      Patient will benefit from skilled therapeutic intervention in order to improve the following deficits and impairments:  Postural dysfunction, Improper body mechanics, Pain, Decreased range of motion, Decreased mobility, Decreased strength, Decreased endurance, Decreased activity tolerance, Increased fascial restricitons, Hypomobility  Visit Diagnosis: Midline low back pain without sciatica  Other symptoms and signs involving the musculoskeletal system     Problem List Patient Active Problem List   Diagnosis Date Noted  . Glaucoma 04/16/2015  . Palpitations 04/16/2015  . Suprapubic discomfort 09/17/2014  . Allergic rhinitis 06/10/2014  . Insomnia 06/10/2014  . Psoriatic arthritis ([place]) 08/10/2011   . Mild aortic regurgitation 05/14/2010  . History of colonic polyps 12/21/2006  . Hyperlipidemia 12/06/2006  . Hypertension 12/06/2006    NAUMANN-HOUEGNIFIO,Lashaun Poch Acosta 09/30/2015, 1:29 PM  [facility] [address] [place], [place], [address] Phone: [phone]   Fax:  [phone]  Name: Destiny Acosta MRN: [id number] Date of Birth: [birth year]

## 2015-09-30 NOTE — Patient Instructions (Signed)
Angry Cat, All Fours   Kneel on hands and knees. Tuck chin and tighten stomach. Exhale and round back upward  = CAT Inhale and arch back downward  = CAMEL  Hold each 3-5 seconds. Repeat _10-20 times per session. Do 2 sessions per day.  Copyright  VHI. All rights reserved.    Lumbar Side-Bend (All-Fours)   Tilt head and shoulder to right side, rotate same side hip toward head. Repeat _10___ times per set. Do  2 sets per session. Do _2___ sessions per day.  http://orth.exer.us/244   Copyright  VHI. All rights reserved.    Flexion   Sitting on knees, fold body over legs and relax head and arms on floor. Extend arms as far forward as possible. Hold 20-30 seconds. Repeat reaching to the right with both hands and then to the left. Do 2 sessions per day.  Copyright  VHI. All rights reserved.  [place] [address] [place], [place] [address] Phone # [phone] Fax [phone]

## 2015-10-02 ENCOUNTER — Encounter: Payer: Medicare Other | Admitting: Physical Therapy

## 2015-10-08 ENCOUNTER — Ambulatory Visit: Payer: Medicare Other | Admitting: Physical Therapy

## 2015-10-08 ENCOUNTER — Encounter: Payer: Self-pay | Admitting: Physical Therapy

## 2015-10-08 DIAGNOSIS — M545 Low back pain, unspecified: Secondary | ICD-10-CM

## 2015-10-08 DIAGNOSIS — R29898 Other symptoms and signs involving the musculoskeletal system: Secondary | ICD-10-CM

## 2015-10-08 NOTE — Patient Instructions (Signed)

## 2015-10-08 NOTE — Therapy (Signed)
[facility] [address], [place], [place], [address] Phone: [phone]   Fax:  [phone]  Physical Therapy Treatment  Patient Details  Name: Destiny Acosta MRN: [id number] Date of Birth: [birth year] Referring Provider: Dr. Peggyann Shoals   Encounter Date: 10/08/2015      PT End of Session - 10/08/15 1149    Visit Number 3   Number of Visits 12   Date for PT Re-Evaluation 11/05/15   PT Start Time [id number]   PT Stop Time 1220   PT Time Calculation (min) 38 min   Activity Tolerance Patient tolerated treatment well      Past Medical History  Diagnosis Date  . HYPERLIPIDEMIA 12/06/2006  . HYPERTENSION 12/06/2006  . MITRAL VALVE PROLAPSE 12/06/2006  . GERD 12/06/2006  . Aortal stenosis 05/14/2010  . Allergy     Eggplant/KIWI  . Heart murmur     aortic valve  . Hyperplastic colon polyp     Past Surgical History  Procedure Laterality Date  . Tubal ligation      There were no vitals filed for this visit.      Subjective Assessment - 10/08/15 1147    Subjective The exercises make my back sore and I need to take an Advil.    Currently in Pain? Yes   Pain Score 1    Pain Location Back   Pain Orientation Lower   Pain Descriptors / Indicators Dull   Aggravating Factors  Prolonged sitting or standing   Pain Relieving Factors Aleve   Multiple Pain Sites No                         [facility] Adult PT Treatment/Exercise - 10/08/15 0001    Lumbar Exercises: Supine   Ab Set 10 reps;3 seconds   Bent Knee Raise 10 reps   Other Supine Lumbar Exercises Bil knee drops 6x                 PT Education - 10/08/15 1209    Education provided Yes   Education Details TA exercises   Person(s) Educated Patient   Methods Explanation;Demonstration;Tactile cues;Verbal cues;Handout   Comprehension Verbalized understanding;Returned demonstration             PT Long Term Goals - 10/08/15 1208    PT  LONG TERM GOAL #3   Title Improve core stabilization progressing patient to home exercise program to assist in restoring flexibility and strength bilat LE's 11/05/15   Time 6   Period Weeks   Status On-going  Early in her learning               Plan - 10/08/15 1208    Clinical Impression Statement Initiated HEP for Pilates based, neutral spine home program today. Pt did well with some basic hip movements keeping her spine in neutral.    Rehab Potential Good   PT Frequency 2x / week   PT Duration 6 weeks   PT Treatment/Interventions Patient/family education;ADLs/Self Care Home Management;Neuromuscular re-education;Dry needling;Manual techniques;Cryotherapy;Electrical Stimulation;Iontophoresis 4mg /ml Dexamethasone;Moist Heat;Ultrasound;Therapeutic activities;Therapeutic exercise   PT Next Visit Plan Review Pilates   Consulted and Agree with Plan of Care Patient      Patient will benefit from skilled therapeutic intervention in order to improve the following deficits and impairments:  Postural dysfunction, Improper body mechanics, Pain, Decreased range of motion, Decreased strength, Decreased endurance, Decreased activity tolerance, Increased fascial restricitons, Hypomobility  Visit Diagnosis: Midline  low back pain without sciatica  Other symptoms and signs involving the musculoskeletal system     Problem List Patient Active Problem List   Diagnosis Date Noted  . Glaucoma 04/16/2015  . Palpitations 04/16/2015  . Suprapubic discomfort 09/17/2014  . Allergic rhinitis 06/10/2014  . Insomnia 06/10/2014  . Psoriatic arthritis ([place]) 08/10/2011  . Mild aortic regurgitation 05/14/2010  . History of colonic polyps 12/21/2006  . Hyperlipidemia 12/06/2006  . Hypertension 12/06/2006    Le Fer[place], PTA 10/08/2015, 12:18 PM  [facility] [address] [place], [place], [address] Phone: [phone]   Fax:   [phone]  Name: REMA JULSON MRN: [id number] Date of Birth: 1946-05-07

## 2015-10-15 ENCOUNTER — Encounter: Payer: Self-pay | Admitting: Physical Therapy

## 2015-10-15 ENCOUNTER — Ambulatory Visit: Payer: Medicare Other | Attending: Neurosurgery | Admitting: Physical Therapy

## 2015-10-15 DIAGNOSIS — R29898 Other symptoms and signs involving the musculoskeletal system: Secondary | ICD-10-CM | POA: Insufficient documentation

## 2015-10-15 DIAGNOSIS — M545 Low back pain, unspecified: Secondary | ICD-10-CM

## 2015-10-15 NOTE — Therapy (Addendum)
[facility] [address], [place], [place], [address] Phone: [phone]   Fax:  [phone]  Physical Therapy Treatment  Patient Details  Name: Destiny Acosta MRN: [id number] Date of Birth: May 08, 1946 Referring Provider: Dr. Peggyann Shoals   Encounter Date: 10/15/2015      PT End of Session - 10/15/15 1057    Visit Number 4   Number of Visits 12   Date for PT Re-Evaluation 11/05/15   PT Start Time 7510   PT Stop Time 1135   PT Time Calculation (min) 43 min   Activity Tolerance Patient tolerated treatment well   Behavior During Therapy [facility] for tasks assessed/performed      Past Medical History  Diagnosis Date  . HYPERLIPIDEMIA 12/06/2006  . HYPERTENSION 12/06/2006  . MITRAL VALVE PROLAPSE 12/06/2006  . GERD 12/06/2006  . Aortal stenosis 05/14/2010  . Allergy     Eggplant/KIWI  . Heart murmur     aortic valve  . Hyperplastic colon polyp     Past Surgical History  Procedure Laterality Date  . Tubal ligation      There were no vitals filed for this visit.      Subjective Assessment - 10/15/15 1055    Subjective Good days bad days. I need to watch my shoulders today as I have chronic bursitis.    Currently in Pain? Yes   Pain Score 1    Pain Location Coccyx   Pain Orientation Lower   Pain Descriptors / Indicators Dull   Aggravating Factors  Prolonged sitting or standing   Pain Relieving Factors Rest, meds   Multiple Pain Sites No                         [facility] Adult PT Treatment/Exercise - 10/15/15 0001    Lumbar Exercises: Aerobic   Stationary Bike Nustep L2 x 10 min   Lumbar Exercises: Supine   Ab Set --  TA series with leg movements 10x each    Lumbar Exercises: Prone   Other Prone Lumbar Exercises TA contraction 2x5    Other Prone Lumbar Exercises TA contraction with alternating knee bends 10x    Moist Heat Therapy   Number Minutes Moist Heat 15 Minutes  For supine  exercises   Manual Therapy   Manual Therapy --  Prone & supine long axis leg traction                PT Education - 10/15/15 1134    Education provided Yes   Education Details Gave leg movement #3 for HEP in TA series   Person(s) Educated Patient   Methods Explanation;Demonstration;Tactile cues;Verbal cues  Has handout   Comprehension Verbalized understanding;Returned demonstration             PT Long Term Goals - 10/15/15 1108    PT LONG TERM GOAL #2   Title Decrease pain in coccyx by 50-75% allowing patient to participate in functional activities with minimal limitations 11/05/15   Time 6   Period Weeks   Status Partially Met  50% with modifications               Plan - 10/15/15 1057    Clinical Impression Statement Pt had a lot of difficulty facilitating her TA muscle in prone. She demonstrates the supine exercises very well. We had to work around her shoulders feeling sensitive today. Long axis leg traction felt symptom relieving. Pt reports  she feels 50% pain reduction since begiinning PT.   Rehab Potential Good   PT Frequency 2x / week   PT Duration 6 weeks   PT Treatment/Interventions Patient/family education;ADLs/Self Care Home Management;Neuromuscular re-education;Dry needling;Manual techniques;Cryotherapy;Electrical Stimulation;Iontophoresis 55m/ml Dexamethasone;Moist Heat;Ultrasound;Therapeutic activities;Therapeutic exercise   Consulted and Agree with Plan of Care Patient      Patient will benefit from skilled therapeutic intervention in order to improve the following deficits and impairments:  Postural dysfunction, Improper body mechanics, Pain, Decreased range of motion, Decreased strength, Decreased endurance, Decreased activity tolerance, Increased fascial restricitons, Hypomobility  Visit Diagnosis: Midline low back pain without sciatica  Other symptoms and signs involving the musculoskeletal system     Problem List Patient Active  Problem List   Diagnosis Date Noted  . Glaucoma 04/16/2015  . Palpitations 04/16/2015  . Suprapubic discomfort 09/17/2014  . Allergic rhinitis 06/10/2014  . Insomnia 06/10/2014  . Psoriatic arthritis ([place] 08/10/2011  . Mild aortic regurgitation 05/14/2010  . History of colonic polyps 12/21/2006  . Hyperlipidemia 12/06/2006  . Hypertension 12/06/2006    COCHRAN,JENNIFER, PTA 10/15/2015, 11:42 AM  [facility] [address] [place] [address]Phone: [phone]  Fax:  [phone] Name: Destiny STENZELMRN: [id number] of Birth: 622-Apr-1947   PHYSICAL THERAPY DISCHARGE SUMMARY  Visits from Start of Care: 4  Current functional level related to goals / functional outcomes: Patient reports that she has good days and bad days. In general she has less pain in the coccyx and is better able to perform functional activities and ADL's    Remaining deficits: Intermittent increase in pain - needs to continue with HEP May require additional evaluation and treatment of coccyx pain    Education / Equipment: HEP  Plan: Patient agrees to discharge.  Patient goals were partially met. Patient is being discharged due to not returning since the last visit.  ?????   Celyn P. HHelene KelpPT, MPH 11/19/2015 9:12 AM

## 2015-10-22 ENCOUNTER — Ambulatory Visit: Payer: Medicare Other | Admitting: Physical Therapy

## 2015-10-29 ENCOUNTER — Ambulatory Visit
Admission: RE | Admit: 2015-10-29 | Discharge: 2015-10-29 | Disposition: A | Discharge status: Home / Self Care | Payer: Medicare Other | Source: Ambulatory Visit

## 2015-10-29 DIAGNOSIS — Z1231 Encounter for screening mammogram for malignant neoplasm of breast: Secondary | ICD-10-CM | POA: Diagnosis not present

## 2015-11-08 DIAGNOSIS — C801 Malignant (primary) neoplasm, unspecified: Secondary | ICD-10-CM

## 2015-11-08 HISTORY — DX: Malignant (primary) neoplasm, unspecified: C80.1

## 2015-11-24 DIAGNOSIS — D3132 Benign neoplasm of left choroid: Secondary | ICD-10-CM | POA: Diagnosis not present

## 2015-11-24 DIAGNOSIS — H25013 Cortical age-related cataract, bilateral: Secondary | ICD-10-CM | POA: Diagnosis not present

## 2015-11-24 DIAGNOSIS — H401131 Primary open-angle glaucoma, bilateral, mild stage: Secondary | ICD-10-CM | POA: Diagnosis not present

## 2015-11-24 DIAGNOSIS — H5213 Myopia, bilateral: Secondary | ICD-10-CM | POA: Diagnosis not present

## 2015-11-28 DIAGNOSIS — L658 Other specified nonscarring hair loss: Secondary | ICD-10-CM | POA: Diagnosis not present

## 2015-11-28 DIAGNOSIS — D485 Neoplasm of uncertain behavior of skin: Secondary | ICD-10-CM | POA: Diagnosis not present

## 2015-11-28 DIAGNOSIS — L820 Inflamed seborrheic keratosis: Secondary | ICD-10-CM | POA: Diagnosis not present

## 2015-11-28 DIAGNOSIS — C44329 Squamous cell carcinoma of skin of other parts of face: Secondary | ICD-10-CM | POA: Diagnosis not present

## 2015-12-08 DIAGNOSIS — M4806 Spinal stenosis, lumbar region: Secondary | ICD-10-CM | POA: Diagnosis not present

## 2015-12-08 DIAGNOSIS — M4316 Spondylolisthesis, lumbar region: Secondary | ICD-10-CM | POA: Diagnosis not present

## 2015-12-09 DIAGNOSIS — Z885 Allergy status to narcotic agent status: Secondary | ICD-10-CM | POA: Diagnosis not present

## 2015-12-09 DIAGNOSIS — C44319 Basal cell carcinoma of skin of other parts of face: Secondary | ICD-10-CM | POA: Diagnosis not present

## 2015-12-09 DIAGNOSIS — Z881 Allergy status to other antibiotic agents status: Secondary | ICD-10-CM | POA: Diagnosis not present

## 2015-12-16 ENCOUNTER — Encounter: Payer: Self-pay | Admitting: Internal Medicine

## 2015-12-16 DIAGNOSIS — Z483 Aftercare following surgery for neoplasm: Secondary | ICD-10-CM | POA: Diagnosis not present

## 2015-12-16 DIAGNOSIS — Z4802 Encounter for removal of sutures: Secondary | ICD-10-CM | POA: Diagnosis not present

## 2015-12-29 DIAGNOSIS — M4316 Spondylolisthesis, lumbar region: Secondary | ICD-10-CM | POA: Diagnosis not present

## 2015-12-29 DIAGNOSIS — M4806 Spinal stenosis, lumbar region: Secondary | ICD-10-CM | POA: Diagnosis not present

## 2016-01-01 DIAGNOSIS — M154 Erosive (osteo)arthritis: Secondary | ICD-10-CM | POA: Diagnosis not present

## 2016-01-01 DIAGNOSIS — M150 Primary generalized (osteo)arthritis: Secondary | ICD-10-CM | POA: Diagnosis not present

## 2016-01-01 DIAGNOSIS — M4800 Spinal stenosis, site unspecified: Secondary | ICD-10-CM | POA: Diagnosis not present

## 2016-01-06 DIAGNOSIS — Z124 Encounter for screening for malignant neoplasm of cervix: Secondary | ICD-10-CM | POA: Diagnosis not present

## 2016-01-14 ENCOUNTER — Ambulatory Visit (INDEPENDENT_AMBULATORY_CARE_PROVIDER_SITE_OTHER): Payer: Medicare Other | Admitting: Family Medicine

## 2016-01-14 ENCOUNTER — Encounter: Payer: Self-pay | Admitting: Family Medicine

## 2016-01-14 ENCOUNTER — Other Ambulatory Visit: Payer: Medicare Other

## 2016-01-14 ENCOUNTER — Telehealth: Payer: Self-pay | Admitting: Family Medicine

## 2016-01-14 VITALS — BP 136/78 | HR 65 | Temp 97.6°F | Ht 64.0 in | Wt 149.4 lb

## 2016-01-14 DIAGNOSIS — I351 Nonrheumatic aortic (valve) insufficiency: Secondary | ICD-10-CM

## 2016-01-14 DIAGNOSIS — R319 Hematuria, unspecified: Secondary | ICD-10-CM | POA: Diagnosis not present

## 2016-01-14 DIAGNOSIS — Z23 Encounter for immunization: Secondary | ICD-10-CM

## 2016-01-14 DIAGNOSIS — I10 Essential (primary) hypertension: Secondary | ICD-10-CM | POA: Diagnosis not present

## 2016-01-14 DIAGNOSIS — M48061 Spinal stenosis, lumbar region without neurogenic claudication: Secondary | ICD-10-CM | POA: Insufficient documentation

## 2016-01-14 DIAGNOSIS — Z0000 Encounter for general adult medical examination without abnormal findings: Secondary | ICD-10-CM

## 2016-01-14 DIAGNOSIS — L4050 Arthropathic psoriasis, unspecified: Secondary | ICD-10-CM | POA: Diagnosis not present

## 2016-01-14 DIAGNOSIS — E785 Hyperlipidemia, unspecified: Secondary | ICD-10-CM

## 2016-01-14 LAB — POC URINALSYSI DIPSTICK (AUTOMATED)
Bilirubin, UA: NEGATIVE
Glucose, UA: NEGATIVE
KETONES UA: NEGATIVE
NITRITE UA: NEGATIVE
PH UA: 6.5
PROTEIN UA: NEGATIVE
Spec Grav, UA: <=1.005
UROBILINOGEN UA: 0.2

## 2016-01-14 LAB — CBC
HEMATOCRIT: 40.4 % (ref 36.0–46.0)
Hemoglobin: 13.9 g/dL (ref 12.0–15.0)
MCHC: 34.3 g/dL (ref 30.0–36.0)
MCV: 91.8 fl (ref 78.0–100.0)
PLATELETS: 307 10*3/uL (ref 150.0–400.0)
RBC: 4.41 Mil/uL (ref 3.87–5.11)
RDW: 14.2 % (ref 11.5–15.5)
WBC: 6.3 10*3/uL (ref 4.0–10.5)

## 2016-01-14 LAB — COMPREHENSIVE METABOLIC PANEL
ALT: 19 U/L (ref 0–35)
AST: 19 U/L (ref 0–37)
Albumin: 4.4 g/dL (ref 3.5–5.2)
Alkaline Phosphatase: 46 U/L (ref 39–117)
BUN: 11 mg/dL (ref 6–23)
CALCIUM: 9.6 mg/dL (ref 8.4–10.5)
CHLORIDE: 97 meq/L (ref 96–112)
CO2: 26 mEq/L (ref 19–32)
CREATININE: 0.54 mg/dL (ref 0.40–1.20)
GFR: 118.21 mL/min (ref >60.00)
Glucose, Bld: 82 mg/dL (ref 70–99)
POTASSIUM: 3.6 meq/L (ref 3.5–5.1)
Sodium: 133 mEq/L — ABNORMAL LOW (ref 135–145)
Total Bilirubin: 1.4 mg/dL — ABNORMAL HIGH (ref 0.2–1.2)
Total Protein: 7.4 g/dL (ref 6.0–8.3)

## 2016-01-14 LAB — URINALYSIS, MICROSCOPIC ONLY: RBC / HPF: NONE SEEN (ref 0–?)

## 2016-01-14 LAB — LDL CHOLESTEROL, DIRECT: Direct LDL: 144 mg/dL

## 2016-01-14 MED ORDER — LISINOPRIL 10 MG PO TABS: 10.0000 mg | ORAL_TABLET | Freq: Every day | ORAL | 3 refills | Status: DC

## 2016-01-14 NOTE — Progress Notes (Signed)
Phone: [phone]  Subjective:  Patient presents today for their annual wellness visit.    Preventive Screening-Counseling & Management  Smoking Status: Never Smoker Second Hand Smoking status: No smokers in home  Risk Factors Regular exercise: walking 45 minutes at least 5 days a week- mostly 7 days!  Diet: reasonable, could lose a few more lbs Wt Readings from Last 3 Encounters:  01/14/16 149 lb 6.4 oz (67.8 kg)  04/16/15 152 lb (68.9 kg)  09/17/14 151 lb (68.5 kg)  Fall Risk: None  Fall Risk  01/14/2016 09/17/2014 08/20/2013  Falls in the past year? No No No    Cardiac risk factors:  advanced age (older than 2 for men, 38 for women)  HTN- mild elevation on first check, historically controlled on lisinopril 10mg  BP Readings from Last 3 Encounters:  01/14/16 136/78  04/16/15 116/70  09/17/14 122/72  Hyperlipidemia - very mild elevations in cholesterol- will update. Very high HDL No diabetes, family history   Depression Screen None. PHQ2 0  Depression screen [facility] 2/9 01/14/2016 09/17/2014 08/20/2013  Decreased Interest 0 0 0  Down, Depressed, Hopeless 0 0 1  PHQ - 2 Score 0 0 1    Activities of Daily Living Independent ADLs and IADLs   Hearing Difficulties: -patient states sometimes needs things repeated and has trouble with whispered voices. She does not want to seek audiology assistance. Issue where she had hearing loss when flying when younger- has not flown since. Had seen audiologist at that time- hearing gradually improved to normal before worsening some with age.   Cognitive Testing No reported trouble.   Normal 3 word recall  Advanced directives  Husband HCPOA- wants son involved  Full code- not prolonged therapy  Family aware of wishes  List the Names of Other Physician/Practitioners you currently use: -Dr. Vertell Limber neurosurgery -Dr. Maryjean Ka- injections through Dr. Melven Sartorius office- 2 weeks ago - Dr. Rhona Raider Mohs surgery- 8 weeks ago. Squamous  cell - Dr. Verl Blalock optometry 1 month ago - Dr. Amil Amen rheumatology - Dr. Lovena Le [place]- for hair loss also with rogaine.   Immunization History  Administered Date(s) Administered  . Influenza Split 02/08/2011, 02/08/2012  . Influenza Whole 03/02/2007, 02/20/2008  . Influenza,inj,Quad PF,36+ Mos 02/09/2013, 01/18/2014, 02/06/2015  . Pneumococcal Conjugate-13 06/10/2014  . Pneumococcal Polysaccharide-23 11/17/2007, 02/08/2011  . Td 05/10/2001  . Tdap 04/18/2013  . Zoster 12/20/2007   Required Immunizations needed today : high dose flu shot today  Screening tests- up to date. Mammogram 10/2015 normal, colonoscopy in october for 5 year repeat.   ROS- No pertinent positives discovered in course of AWV ROS pertinent- no shortness of breath. No headache or blurry vision.    The following were reviewed and entered/updated in epic: Past Medical History:  Diagnosis Date  . Allergy    Eggplant/KIWI  . Aortal stenosis 05/14/2010  . GERD 12/06/2006  . Heart murmur    aortic valve  . HYPERLIPIDEMIA 12/06/2006  . Hyperplastic colon polyp   . HYPERTENSION 12/06/2006  . MITRAL VALVE PROLAPSE 12/06/2006   Patient Active Problem List   Diagnosis Date Noted  . Spinal stenosis of lumbar region 01/14/2016    Priority: Medium  . Suprapubic discomfort 09/17/2014    Priority: Medium  . Psoriatic arthritis ([place]) 08/10/2011    Priority: Medium  . Mild aortic regurgitation 05/14/2010    Priority: Medium  . Hyperlipidemia 12/06/2006    Priority: Medium  . Hypertension 12/06/2006    Priority: Medium  . Glaucoma 04/16/2015  Priority: Low  . Palpitations 04/16/2015    Priority: Low  . Allergic rhinitis 06/10/2014    Priority: Low  . Insomnia 06/10/2014    Priority: Low  . History of colonic polyps 12/21/2006    Priority: Low   Past Surgical History:  Procedure Laterality Date  . TUBAL LIGATION      Family History  Problem Relation Age of Onset  . Colon cancer Father 46  . Atrial  fibrillation Father   . Diabetes Mother   . Heart failure Mother   . Heart disease Mother 76    CABG, quit 20 years prior o bypass  . Diabetes Maternal Grandmother   . Arthritis Sister   . Diabetes Sister     pre-diabetic    Medications- reviewed and updated Current Outpatient Prescriptions  Medication Sig Dispense Refill  . Biotin 5000 MCG TABS Take 1 tablet by mouth daily.    . Calcium Carbonate-Vitamin D (CALTRATE 600+D) 600-400 MG-UNIT per tablet Take 1 tablet by mouth daily.      . Doxylamine Succinate, Sleep, (UNISOM) 25 MG tablet Take 25 mg by mouth at bedtime as needed.      . ESTERIFIED ESTROGENS PO Take 1 g by mouth once a week.    . fexofenadine (ALLEGRA) 180 MG tablet Take 180 mg by mouth daily as needed.     . finasteride (PROSCAR) 5 MG tablet Take 5 mg by mouth daily.    Marland Kitchen ibuprofen (ADVIL,MOTRIN) 200 MG tablet Take 200 mg by mouth as needed. pain    . latanoprost (XALATAN) 0.005 % ophthalmic solution Place 1 drop into both eyes at bedtime.    Marland Kitchen lisinopril (PRINIVIL,ZESTRIL) 10 MG tablet Take 1 tablet (10 mg total) by mouth daily. 90 tablet 1  . Magnesium 250 MG TABS Take 1 tablet by mouth 2 (two) times daily.      . Minoxidil 5 % FOAM Apply 1 application topically daily.    . Multiple Vitamin (MULTIVITAMIN) capsule Take 1 capsule by mouth daily.      Marland Kitchen tretinoin (RETIN-A) 0.1 % cream Apply topically at bedtime.       No current facility-administered medications for this visit.     Allergies-reviewed and updated Allergies  Allergen Reactions  . Ciprofloxacin Other (See Comments)    Muscle pain  . Codeine Phosphate     REACTION: nausea, vomiting  . Latex Rash    Social History   Social History  . Marital status: Married    Spouse name: N/A  . Number of children: N/A  . Years of education: N/A   Social History Main Topics  . Smoking status: Never Smoker  . Smokeless tobacco: Never Used  . Alcohol use Yes     Comment: rarely may have 1 wine a month or  less  . Drug use: No  . Sexual activity: Not Asked   Other Topics Concern  . None   Social History Narrative   Married 1967. 1 son, lives in [place]. 1 grandson in [place] age 76.       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       Hobbies: family time, walking 2.5-3 miles daily, moviews     Objective: BP 136/78   Pulse 65   Temp 97.6 F (36.4 C) (Oral)   Ht 5\' 4"  (1.626 m)   Wt 149 lb 6.4 oz (67.8 kg)   SpO2 95%   BMI 25.64 kg/m  Gen: NAD, resting comfortably HEENT: Mucous membranes  are moist. Oropharynx normal Neck: no thyromegaly CV: RRR 3/6 diastolic murmur. no rubs or gallops Lungs: CTAB no crackles, wheeze, rhonchi Abdomen: soft/nontender/nondistended/normal bowel sounds. No rebound or guarding.  Ext: no edema Skin: warm, dry, no rash Neuro: grossly normal, moves all extremities, PERRLA  Assessment/Plan:  AWV completed- discussed recommended screenings anddocumented any personalized health advice and referrals for preventive counseling. See AVS as well which was given to patient.   Status of chronic or acute concerns   Psoriatic arthritis ([place]) S: Likely psoriatic arthritis- now followed by Dr. Suezanne Cheshire In past- followed by rheum Dr. Ouida Sills. Dr. Verlene Mayer at [facility] says not psoriatic.  A/P: needs 6 month labs from cbc, cmet recommended by Dr. Jenetta Downer rourke previously- update today   Mild aortic regurgitation S: last ech 2016- mild aortic regurg. In past mild aortic sclerosis noted A/P: we discussed repeating 2018-2019. Stable murmur and asymptomatic   Hypertension S: controlled on lisinopril 10mg  BP Readings from Last 3 Encounters:  01/14/16 136/78  04/16/15 116/70  09/17/14 122/72  A/P:Continue current meds:  Doing well   Hyperlipidemia S: mild poorly controlled on no rx. No myalgias.  Lab Results  Component Value Date   CHOL 194 05/09/2015   HDL 64.30 05/09/2015   LDLCALC 105 (H) 06/11/2014   LDLDIRECT 117.0  05/09/2015   TRIG 54.0 06/11/2014   CHOLHDL 3 06/11/2014   A/P: memory loss on lipitor- wants to avoid statin. Has lost 3 lbs- update direct LDL at this point- full lipids at next available. Continue aspirin. Will discuss reduction of risk % on statin   1 year. Advised her to consider AWV with Manuela Schwartz- did not seem highly interested. Advised 6 months for BP check which she declines but will check blood pressure at home  Orders Placed This Encounter  Procedures  . CBC    [place]  . Comprehensive metabolic panel    [place]  . LDL cholesterol, direct    [place]  . POCT Urinalysis Dipstick (Automated)    Meds ordered this encounter  Medications  . DISCONTD: ESTERIFIED ESTROGENS PO    Sig: Take 1 g by mouth once a week.  . latanoprost (XALATAN) 0.005 % ophthalmic solution    Sig: Place 1 drop into both eyes at bedtime.  Marland Kitchen lisinopril (PRINIVIL,ZESTRIL) 10 MG tablet    Sig: Take 1 tablet (10 mg total) by mouth daily.    Dispense:  90 tablet    Refill:  3    Return precautions advised.  Garret Reddish, MD

## 2016-01-14 NOTE — Assessment & Plan Note (Signed)
S: controlled on lisinopril 10mg  BP Readings from Last 3 Encounters:  01/14/16 136/78  04/16/15 116/70  09/17/14 122/72  A/P:Continue current meds:  Doing well

## 2016-01-14 NOTE — Assessment & Plan Note (Signed)
S: mild poorly controlled on no rx. No myalgias.  Lab Results  Component Value Date   CHOL 194 05/09/2015   HDL 64.30 05/09/2015   LDLCALC 105 (H) 06/11/2014   LDLDIRECT 117.0 05/09/2015   TRIG 54.0 06/11/2014   CHOLHDL 3 06/11/2014   A/P: memory loss on lipitor- wants to avoid statin. Has lost 3 lbs- update direct LDL at this point- full lipids at next available. Continue aspirin. Will discuss reduction of risk % on statin

## 2016-01-14 NOTE — Assessment & Plan Note (Signed)
S: last ech 2016- mild aortic regurg. In past mild aortic sclerosis noted A/P: we discussed repeating 2018-2019. Stable murmur and asymptomatic

## 2016-01-14 NOTE — Assessment & Plan Note (Signed)
S: Likely psoriatic arthritis- now followed by Dr. Suezanne Cheshire In past- followed by rheum Dr. Ouida Sills. Dr. Verlene Mayer at [facility] says not psoriatic.  A/P: needs 6 month labs from cbc, cmet recommended by Dr. Jenetta Downer rourke previously- update today

## 2016-01-14 NOTE — Addendum Note (Signed)
Addended by: Elmer Picker on: 01/14/2016 02:29 PM   Modules accepted: Orders

## 2016-01-14 NOTE — Patient Instructions (Addendum)
  [name] , Thank you for taking time to come for your Medicare Wellness Visit. I appreciate your ongoing commitment to your health goals. Please review the following plan we discussed and let me know if I can assist you in the future.   These are the goals we discussed: 1. High dose flu shot today 2.  Labs before you go   This is a list of the screening recommended for you and due dates:  Health Maintenance  Topic Date Due  . Flu Shot  12/09/2015  . Colon Cancer Screening  02/26/2016  . Mammogram  10/28/2017  . Tetanus Vaccine  04/19/2023  . DEXA scan (bone density measurement)  Completed  . Shingles Vaccine  Completed  .  Hepatitis C: One time screening is recommended by Center for Disease Control  (CDC) for  adults born from [birth year] through 1965.   Completed  . Pneumonia vaccines  Completed

## 2016-01-14 NOTE — Progress Notes (Signed)
Pre visit review using our clinic review tool, if applicable. No additional management support is needed unless otherwise documented below in the visit note. 

## 2016-01-15 DIAGNOSIS — H401131 Primary open-angle glaucoma, bilateral, mild stage: Secondary | ICD-10-CM | POA: Diagnosis not present

## 2016-01-16 ENCOUNTER — Encounter: Payer: Self-pay | Admitting: Family Medicine

## 2016-01-19 DIAGNOSIS — M4806 Spinal stenosis, lumbar region: Secondary | ICD-10-CM | POA: Diagnosis not present

## 2016-01-19 DIAGNOSIS — M4316 Spondylolisthesis, lumbar region: Secondary | ICD-10-CM | POA: Diagnosis not present

## 2016-01-19 DIAGNOSIS — Z6825 Body mass index (BMI) 25.0-25.9, adult: Secondary | ICD-10-CM | POA: Diagnosis not present

## 2016-01-20 ENCOUNTER — Encounter: Payer: Medicare Other | Admitting: Family Medicine

## 2016-01-22 ENCOUNTER — Encounter: Payer: Self-pay | Admitting: Family Medicine

## 2016-02-16 DIAGNOSIS — Z6825 Body mass index (BMI) 25.0-25.9, adult: Secondary | ICD-10-CM | POA: Diagnosis not present

## 2016-02-16 DIAGNOSIS — M48061 Spinal stenosis, lumbar region without neurogenic claudication: Secondary | ICD-10-CM | POA: Diagnosis not present

## 2016-02-18 ENCOUNTER — Ambulatory Visit (AMBULATORY_SURGERY_CENTER): Payer: Self-pay | Admitting: *Deleted

## 2016-02-18 VITALS — Ht 64.0 in | Wt 147.0 lb

## 2016-02-18 DIAGNOSIS — Z86010 Personal history of colonic polyps: Secondary | ICD-10-CM

## 2016-02-18 MED ORDER — NA SULFATE-K SULFATE-MG SULF 17.5-3.13-1.6 GM/177ML PO SOLN
1.0000 | Freq: Once | ORAL | 0 refills | Status: AC
Start: 2016-02-18 — End: 2016-02-18

## 2016-02-18 NOTE — Progress Notes (Signed)
No allergies to eggs or soy. No problems with anesthesia.  Pt given Emmi instructions for colonoscopy  No oxygen use  No diet drug use  

## 2016-02-23 ENCOUNTER — Telehealth: Payer: Self-pay | Admitting: Internal Medicine

## 2016-02-23 DIAGNOSIS — Z1211 Encounter for screening for malignant neoplasm of colon: Secondary | ICD-10-CM

## 2016-02-24 ENCOUNTER — Other Ambulatory Visit: Payer: Self-pay | Admitting: *Deleted

## 2016-02-24 DIAGNOSIS — Z86010 Personal history of colonic polyps: Secondary | ICD-10-CM

## 2016-02-24 MED ORDER — NA SULFATE-K SULFATE-MG SULF 17.5-3.13-1.6 GM/177ML PO SOLN: 1.0000 | Freq: Once | ORAL | 0 refills | Status: AC

## 2016-03-01 ENCOUNTER — Encounter: Payer: Self-pay | Admitting: Internal Medicine

## 2016-03-01 ENCOUNTER — Ambulatory Visit (AMBULATORY_SURGERY_CENTER): Payer: Medicare Other | Admitting: Internal Medicine

## 2016-03-01 VITALS — BP 125/69 | HR 57 | Temp 98.7°F | Resp 12 | Ht 64.0 in | Wt 147.0 lb

## 2016-03-01 DIAGNOSIS — D120 Benign neoplasm of cecum: Secondary | ICD-10-CM

## 2016-03-01 DIAGNOSIS — D123 Benign neoplasm of transverse colon: Secondary | ICD-10-CM

## 2016-03-01 DIAGNOSIS — Z86010 Personal history of colonic polyps: Secondary | ICD-10-CM

## 2016-03-01 DIAGNOSIS — I351 Nonrheumatic aortic (valve) insufficiency: Secondary | ICD-10-CM | POA: Diagnosis not present

## 2016-03-01 DIAGNOSIS — I10 Essential (primary) hypertension: Secondary | ICD-10-CM | POA: Diagnosis not present

## 2016-03-01 MED ORDER — SODIUM CHLORIDE 0.9 % IV SOLN: 500.0000 mL | INTRAVENOUS | Status: DC

## 2016-03-01 NOTE — Op Note (Signed)
[place] Patient Name: Destiny Acosta Procedure Date: 03/01/2016 10:26 AM MRN: [id number] Endoscopist: Docia Chuck. Henrene Pastor , MD Age: 70 Referring MD:  Date of Birth: 06/20/1945 Gender: Female Account #: [id number] Procedure:                Colonoscopy, with cold snare polypectomy X3 Indications:              High risk colon cancer surveillance: Personal                            history of sessile serrated colon polyp (less than                            10 mm in size) with no dysplasia. Previous                            examinations 2007 and 2012 with HPP and [facility] Medicines:                Monitored Anesthesia Care Procedure:                Pre-Anesthesia Assessment:                           - Prior to the procedure, a History and Physical                            was performed, and patient medications and                            allergies were reviewed. The patient's tolerance of                            previous anesthesia was also reviewed. The risks                            and benefits of the procedure and the sedation                            options and risks were discussed with the patient.                            All questions were answered, and informed consent                            was obtained. Prior Anticoagulants: The patient has                            taken no previous anticoagulant or antiplatelet                            agents. ASA Grade Assessment: II - A patient with                            mild systemic disease. After reviewing the risks  and benefits, the patient was deemed in                            satisfactory condition to undergo the procedure.                           After obtaining informed consent, the colonoscope                            was passed under direct vision. Throughout the                            procedure, the patient's blood pressure, pulse, and       oxygen saturations were monitored continuously. The                            Model CF-HQ190L [id number]) scope was introduced                            through the anus and advanced to the the cecum,                            identified by appendiceal orifice and ileocecal                            valve. The ileocecal valve, appendiceal orifice,                            and rectum were photographed. The quality of the                            bowel preparation was good. The colonoscopy was                            performed without difficulty. The patient tolerated                            the procedure well. The bowel preparation used was                            SUPREP. Scope In: 10:41:43 AM Scope Out: 10:59:30 AM Scope Withdrawal Time: 0 hours 14 minutes 8 seconds  Total Procedure Duration: 0 hours 17 minutes 47 seconds  Findings:                 Three polyps were found in the transverse colon and                            cecum. The polyps were 1 to 5 mm in size.                           The exam was otherwise without abnormality on  direct and retroflexion views. Complications:            No immediate complications. Estimated blood loss:                            None. Estimated Blood Loss:     Estimated blood loss: none. Impression:               - Three 1 to 5 mm polyps in the transverse colon                            and in the cecum.                           - The examination was otherwise normal on direct                            and retroflexion views.                           - No specimens collected. Recommendation:           - Repeat colonoscopy in 5 years for surveillance.                           - Patient has a contact number available for                            emergencies. The signs and symptoms of potential                            delayed complications were discussed with the                             patient. Return to normal activities tomorrow.                            Written discharge instructions were provided to the                            patient.                           - Resume previous diet.                           - Continue present medications.                           - Await pathology results. Docia Chuck. Henrene Pastor, MD 03/01/2016 11:04:31 AM This report has been signed electronically.

## 2016-03-01 NOTE — Progress Notes (Signed)
Called to room to assist during endoscopic procedure.  Patient ID and intended procedure confirmed with present staff. Received instructions for my participation in the procedure from the performing physician.  

## 2016-03-01 NOTE — Progress Notes (Signed)
Report to PACU, RN, vss, BBS= Clear.  

## 2016-03-01 NOTE — Patient Instructions (Signed)
YOU HAD AN ENDOSCOPIC PROCEDURE TODAY AT THE [facility]:   Refer to the procedure report that was given to you for any specific questions about what was found during the examination.  If the procedure report does not answer your questions, please call your gastroenterologist to clarify.  If you requested that your care partner not be given the details of your procedure findings, then the procedure report has been included in a sealed envelope for you to review at your convenience later.  YOU SHOULD EXPECT: Some feelings of bloating in the abdomen. Passage of more gas than usual.  Walking can help get rid of the air that was put into your GI tract during the procedure and reduce the bloating. If you had a lower endoscopy (such as a colonoscopy or flexible sigmoidoscopy) you may notice spotting of blood in your stool or on the toilet paper. If you underwent a bowel prep for your procedure, you may not have a normal bowel movement for a few days.  Please Note:  You might notice some irritation and congestion in your nose or some drainage.  This is from the oxygen used during your procedure.  There is no need for concern and it should clear up in a day or so.  SYMPTOMS TO REPORT IMMEDIATELY:   Following lower endoscopy (colonoscopy or flexible sigmoidoscopy):  Excessive amounts of blood in the stool  Significant tenderness or worsening of abdominal pains  Swelling of the abdomen that is new, acute  Fever of 100F or higher    For urgent or emergent issues, a gastroenterologist can be reached at any hour by calling [phone].   DIET:  We do recommend a small meal at first, but then you may proceed to your regular diet.  Drink plenty of fluids but you should avoid alcoholic beverages for 24 hours.  ACTIVITY:  You should plan to take it easy for the rest of today and you should NOT DRIVE or use heavy machinery until tomorrow (because of the sedation medicines used during the test).     FOLLOW UP: Our staff will call the number listed on your records the next business day following your procedure to check on you and address any questions or concerns that you may have regarding the information given to you following your procedure. If we do not reach you, we will leave a message.  However, if you are feeling well and you are not experiencing any problems, there is no need to return our call.  We will assume that you have returned to your regular daily activities without incident.  If any biopsies were taken you will be contacted by phone or by letter within the next 1-3 weeks.  Please call us at [phone] if you have not heard about the biopsies in 3 weeks.    SIGNATURES/CONFIDENTIALITY: You and/or your care partner have signed paperwork which will be entered into your electronic medical record.  These signatures attest to the fact that that the information above on your After Visit Summary has been reviewed and is understood.  Full responsibility of the confidentiality of this discharge information lies with you and/or your care-partner.   Resume medications. Information given on polyps. 

## 2016-03-02 ENCOUNTER — Telehealth: Payer: Self-pay | Admitting: *Deleted

## 2016-03-02 NOTE — Telephone Encounter (Signed)
  Follow up Call-  Call back number 03/01/2016  Post procedure Call Back phone  # [phone]  Permission to leave phone message Yes  Some recent data might be hidden     Patient questions:  Do you have a fever, pain , or abdominal swelling? No. Pain Score  0 *  Have you tolerated food without any problems? Yes.    Have you been able to return to your normal activities? No.  Do you have any questions about your discharge instructions: Diet   No. Medications  No. Follow up visit  No.  Do you have questions or concerns about your Care? No.  Actions: * If pain score is 4 or above: No action needed, pain <4.

## 2016-03-02 NOTE — Telephone Encounter (Signed)
Pt already had Colonoscopy.

## 2016-03-04 ENCOUNTER — Encounter: Payer: Self-pay | Admitting: Internal Medicine

## 2016-03-05 ENCOUNTER — Encounter: Payer: Self-pay | Admitting: Family Medicine

## 2016-03-08 ENCOUNTER — Telehealth: Payer: Self-pay | Admitting: Family Medicine

## 2016-03-08 NOTE — Telephone Encounter (Signed)
Spoke with patient who stated she had placed a prescription on file with [facility]. She will call them to have them fill her prescription. I let her know we had sent a 90 day prescription in back in September with 3 refills. I did tell her if she had any trouble to call me back and I would be happy to call [facility].

## 2016-03-08 NOTE — Telephone Encounter (Signed)
Pt need new Rx for lisinopril   Pharm:  [facility] [place] and [address]

## 2016-05-25 DIAGNOSIS — H5213 Myopia, bilateral: Secondary | ICD-10-CM | POA: Diagnosis not present

## 2016-05-25 DIAGNOSIS — D3132 Benign neoplasm of left choroid: Secondary | ICD-10-CM | POA: Diagnosis not present

## 2016-05-25 DIAGNOSIS — H401131 Primary open-angle glaucoma, bilateral, mild stage: Secondary | ICD-10-CM | POA: Diagnosis not present

## 2016-05-25 DIAGNOSIS — H25013 Cortical age-related cataract, bilateral: Secondary | ICD-10-CM | POA: Diagnosis not present

## 2016-06-28 DIAGNOSIS — H2513 Age-related nuclear cataract, bilateral: Secondary | ICD-10-CM | POA: Diagnosis not present

## 2016-06-28 DIAGNOSIS — H40033 Anatomical narrow angle, bilateral: Secondary | ICD-10-CM | POA: Diagnosis not present

## 2016-07-15 DIAGNOSIS — H40033 Anatomical narrow angle, bilateral: Secondary | ICD-10-CM | POA: Diagnosis not present

## 2016-07-27 ENCOUNTER — Ambulatory Visit: Payer: Medicare Other | Admitting: Family Medicine

## 2016-07-29 DIAGNOSIS — H40033 Anatomical narrow angle, bilateral: Secondary | ICD-10-CM | POA: Diagnosis not present

## 2016-08-19 DIAGNOSIS — M48061 Spinal stenosis, lumbar region without neurogenic claudication: Secondary | ICD-10-CM | POA: Diagnosis not present

## 2016-08-19 DIAGNOSIS — M19042 Primary osteoarthritis, left hand: Secondary | ICD-10-CM | POA: Diagnosis not present

## 2016-08-19 DIAGNOSIS — M154 Erosive (osteo)arthritis: Secondary | ICD-10-CM | POA: Diagnosis not present

## 2016-08-19 DIAGNOSIS — M19041 Primary osteoarthritis, right hand: Secondary | ICD-10-CM | POA: Diagnosis not present

## 2016-08-28 DIAGNOSIS — J208 Acute bronchitis due to other specified organisms: Secondary | ICD-10-CM | POA: Diagnosis not present

## 2016-08-30 DIAGNOSIS — H401190 Primary open-angle glaucoma, unspecified eye, stage unspecified: Secondary | ICD-10-CM | POA: Insufficient documentation

## 2016-08-30 DIAGNOSIS — H401111 Primary open-angle glaucoma, right eye, mild stage: Secondary | ICD-10-CM | POA: Diagnosis not present

## 2016-08-30 DIAGNOSIS — H25013 Cortical age-related cataract, bilateral: Secondary | ICD-10-CM | POA: Diagnosis not present

## 2016-08-30 DIAGNOSIS — H2513 Age-related nuclear cataract, bilateral: Secondary | ICD-10-CM | POA: Diagnosis not present

## 2016-08-30 DIAGNOSIS — H401122 Primary open-angle glaucoma, left eye, moderate stage: Secondary | ICD-10-CM | POA: Diagnosis not present

## 2016-09-13 DIAGNOSIS — D3131 Benign neoplasm of right choroid: Secondary | ICD-10-CM | POA: Diagnosis not present

## 2016-09-13 DIAGNOSIS — H43813 Vitreous degeneration, bilateral: Secondary | ICD-10-CM | POA: Diagnosis not present

## 2016-09-13 DIAGNOSIS — H35362 Drusen (degenerative) of macula, left eye: Secondary | ICD-10-CM | POA: Diagnosis not present

## 2016-09-14 ENCOUNTER — Other Ambulatory Visit: Payer: Self-pay | Admitting: Obstetrics and Gynecology

## 2016-09-14 DIAGNOSIS — Z1231 Encounter for screening mammogram for malignant neoplasm of breast: Secondary | ICD-10-CM

## 2016-10-07 DIAGNOSIS — H2511 Age-related nuclear cataract, right eye: Secondary | ICD-10-CM | POA: Diagnosis not present

## 2016-10-07 DIAGNOSIS — D3132 Benign neoplasm of left choroid: Secondary | ICD-10-CM | POA: Diagnosis not present

## 2016-10-07 DIAGNOSIS — Z885 Allergy status to narcotic agent status: Secondary | ICD-10-CM | POA: Diagnosis not present

## 2016-10-07 DIAGNOSIS — H25011 Cortical age-related cataract, right eye: Secondary | ICD-10-CM | POA: Diagnosis not present

## 2016-10-07 DIAGNOSIS — Z881 Allergy status to other antibiotic agents status: Secondary | ICD-10-CM | POA: Diagnosis not present

## 2016-10-07 DIAGNOSIS — H25013 Cortical age-related cataract, bilateral: Secondary | ICD-10-CM | POA: Diagnosis not present

## 2016-10-07 DIAGNOSIS — Z91018 Allergy to other foods: Secondary | ICD-10-CM | POA: Diagnosis not present

## 2016-10-07 DIAGNOSIS — H401111 Primary open-angle glaucoma, right eye, mild stage: Secondary | ICD-10-CM | POA: Diagnosis not present

## 2016-10-07 DIAGNOSIS — I10 Essential (primary) hypertension: Secondary | ICD-10-CM | POA: Diagnosis not present

## 2016-10-07 DIAGNOSIS — Z91040 Latex allergy status: Secondary | ICD-10-CM | POA: Diagnosis not present

## 2016-10-07 DIAGNOSIS — H4010X1 Unspecified open-angle glaucoma, mild stage: Secondary | ICD-10-CM | POA: Diagnosis not present

## 2016-10-07 DIAGNOSIS — H401131 Primary open-angle glaucoma, bilateral, mild stage: Secondary | ICD-10-CM | POA: Diagnosis not present

## 2016-10-07 DIAGNOSIS — Z79899 Other long term (current) drug therapy: Secondary | ICD-10-CM | POA: Diagnosis not present

## 2016-10-07 DIAGNOSIS — Z7982 Long term (current) use of aspirin: Secondary | ICD-10-CM | POA: Diagnosis not present

## 2016-10-15 DIAGNOSIS — M4316 Spondylolisthesis, lumbar region: Secondary | ICD-10-CM | POA: Diagnosis not present

## 2016-10-15 DIAGNOSIS — M48061 Spinal stenosis, lumbar region without neurogenic claudication: Secondary | ICD-10-CM | POA: Diagnosis not present

## 2016-10-15 DIAGNOSIS — M545 Low back pain: Secondary | ICD-10-CM | POA: Diagnosis not present

## 2016-10-15 DIAGNOSIS — M4856XA Collapsed vertebra, not elsewhere classified, lumbar region, initial encounter for fracture: Secondary | ICD-10-CM | POA: Diagnosis not present

## 2016-10-19 ENCOUNTER — Telehealth: Payer: Self-pay | Admitting: Family Medicine

## 2016-10-19 ENCOUNTER — Ambulatory Visit (INDEPENDENT_AMBULATORY_CARE_PROVIDER_SITE_OTHER): Payer: Medicare Other | Admitting: Family Medicine

## 2016-10-19 ENCOUNTER — Encounter: Payer: Self-pay | Admitting: Family Medicine

## 2016-10-19 VITALS — BP 132/76 | HR 64 | Temp 97.8°F | Ht 64.0 in | Wt 151.2 lb

## 2016-10-19 DIAGNOSIS — I351 Nonrheumatic aortic (valve) insufficiency: Secondary | ICD-10-CM | POA: Diagnosis not present

## 2016-10-19 DIAGNOSIS — E785 Hyperlipidemia, unspecified: Secondary | ICD-10-CM

## 2016-10-19 DIAGNOSIS — I10 Essential (primary) hypertension: Secondary | ICD-10-CM

## 2016-10-19 DIAGNOSIS — L4050 Arthropathic psoriasis, unspecified: Secondary | ICD-10-CM

## 2016-10-19 LAB — LIPID PANEL
Cholesterol: 188 mg/dL (ref 0–200)
HDL: 59 mg/dL (ref >39.00)
LDL Cholesterol: 112 mg/dL — ABNORMAL HIGH (ref 0–99)
NonHDL: 129.32
TRIGLYCERIDES: 89 mg/dL (ref 0.0–149.0)
Total CHOL/HDL Ratio: 3
VLDL: 17.8 mg/dL (ref 0.0–40.0)

## 2016-10-19 MED ORDER — LISINOPRIL 10 MG PO TABS: 10.0000 mg | ORAL_TABLET | Freq: Every day | ORAL | 11 refills | Status: DC

## 2016-10-19 NOTE — Progress Notes (Signed)
Subjective:  Destiny Acosta is a 71 y.o. year old very pleasant female patient who presents for/with See problem oriented charting ROS- No chest pain or shortness of breath. No headache or blurry vision. Does have some back pain.   Past Medical History-  Patient Active Problem List   Diagnosis Date Noted  . Spinal stenosis of lumbar region 01/14/2016    Priority: Medium  . Glaucoma 04/16/2015    Priority: Medium  . Suprapubic discomfort 09/17/2014    Priority: Medium  . Psoriatic arthritis ([place]) 08/10/2011    Priority: Medium  . Mild aortic regurgitation 05/14/2010    Priority: Medium  . Hyperlipidemia 12/06/2006    Priority: Medium  . Hypertension 12/06/2006    Priority: Medium  . Palpitations 04/16/2015    Priority: Low  . Allergic rhinitis 06/10/2014    Priority: Low  . Insomnia 06/10/2014    Priority: Low  . History of adenomatous polyp of colon 12/21/2006    Priority: Low    Medications- reviewed and updated Current Outpatient Prescriptions  Medication Sig Dispense Refill  . aspirin EC 81 MG tablet Take 81 mg by mouth daily.    . brimonidine (ALPHAGAN) 0.2 % ophthalmic solution Place 1 drop into the right eye 3 (three) times daily.    . Calcium Carbonate-Vitamin D (CALTRATE 600+D) 600-400 MG-UNIT per tablet Take 1 tablet by mouth daily.      . Doxylamine Succinate, Sleep, (UNISOM) 25 MG tablet Take 25 mg by mouth at bedtime as needed.      . Estradiol (ESTRACE VA) Place vaginally once a week.    . fexofenadine (ALLEGRA) 180 MG tablet Take 180 mg by mouth daily as needed.     . finasteride (PROSCAR) 5 MG tablet Take 5 mg by mouth daily.    . hydroxychloroquine (PLAQUENIL) 200 MG tablet Take 400 mg by mouth daily.    Marland Kitchen ibuprofen (ADVIL,MOTRIN) 200 MG tablet Take 200 mg by mouth as needed. pain    . ketorolac (ACULAR) 0.5 % ophthalmic solution Place 1 drop into both eyes 4 (four) times daily.    Marland Kitchen latanoprost (XALATAN) 0.005 % ophthalmic solution Place 1 drop into both  eyes at bedtime.    Marland Kitchen lisinopril (PRINIVIL,ZESTRIL) 10 MG tablet Take 1 tablet (10 mg total) by mouth daily. 30 tablet 11  . loteprednol (LOTEMAX) 0.5 % ophthalmic suspension Place 1 drop into the right eye 6 (six) times daily.    . Magnesium 250 MG TABS Take 1 tablet by mouth 2 (two) times daily.      . Minoxidil 5 % FOAM Apply 1 application topically daily.    . Multiple Vitamin (MULTIVITAMIN) capsule Take 1 capsule by mouth daily.      Marland Kitchen tretinoin (RETIN-A) 0.1 % cream Apply topically at bedtime.       No current facility-administered medications for this visit.     Objective: BP 132/76 (BP Location: Left Arm, Patient Position: Sitting, Cuff Size: Normal)   Pulse 64   Temp 97.8 F (36.6 C) (Oral)   Ht 5\' 4"  (1.626 m)   Wt 151 lb 3.2 oz (68.6 kg)   SpO2 95%   BMI 25.95 kg/m  Gen: NAD, resting comfortably CV: stable 3/6 diastolic murmur.  no rubs or gallops Lungs: CTAB no crackles, wheeze, rhonchi Abdomen: soft/nontender/nondistended/normal bowel sounds.  Ext: no edema Skin: warm, dry  Assessment/Plan:  Psoriatic arthritis (HCC) S: psoriatic arthritis (possibly) followed by [facility] now- thought not likely psoriatic either- does have spinal stenosis-  suspect erosive osteoarthritis. Prior opinion from Dr.O rourke at [facility] this is not psoriatic. She has been instructed to have q6 month labs.   Currently her pain has improved pain some but being watched closely due to potential glaucoma. She is currently working on pressures nad does have some vision loss Current meds- plaquenil A/P: will update  Cbc and cmp every 6 months but had this 2 months ago at [facility]  Hyperlipidemia S: mild poorly controlled on last check- we have focused on weight loss given memory los on lipitor and her desire to avoid atorvastatin. No myalgias. On asa 81 for primary prevention Lab Results  Component Value Date   CHOL 194 05/09/2015   HDL 64.30 05/09/2015   LDLCALC 105 (H) 06/11/2014   LDLDIRECT 144.0  01/14/2016   TRIG 54.0 06/11/2014   CHOLHDL 3 06/11/2014   A/P: update lipids today  Hypertension S: controlled on lisinopril 10mg .  ASCVD 10 year risk calculation if age 3-79: update lipids today to calculate. She had memory loss on lipitor so wants to avoid statin.  BP Readings from Last 3 Encounters:  10/19/16 132/76  03/01/16 125/69  01/14/16 136/78  A/P: We discussed blood pressure goal of <140/90. Continue current meds  Mild aortic regurgitation S: echo 2016 shows mild aortic regurgitation. asymptomatic A/P: reasonable to repeat next year - echo  At last yearly.   Orders Placed This Encounter  Procedures  . Lipid panel    [place]    Order Specific Question:   Has the patient fasted?    Answer:   No   Meds ordered this encounter  Medications  . brimonidine (ALPHAGAN) 0.2 % ophthalmic solution    Sig: Place 1 drop into the right eye 3 (three) times daily.  . hydroxychloroquine (PLAQUENIL) 200 MG tablet    Sig: Take 400 mg by mouth daily.  Marland Kitchen ketorolac (ACULAR) 0.5 % ophthalmic solution    Sig: Place 1 drop into both eyes 4 (four) times daily.  Marland Kitchen loteprednol (LOTEMAX) 0.5 % ophthalmic suspension    Sig: Place 1 drop into the right eye 6 (six) times daily.  Marland Kitchen lisinopril (PRINIVIL,ZESTRIL) 10 MG tablet    Sig: Take 1 tablet (10 mg total) by mouth daily.    Dispense:  30 tablet    Refill:  11   Return precautions advised.  Garret Reddish, MD

## 2016-10-19 NOTE — Assessment & Plan Note (Signed)
S: mild poorly controlled on last check- we have focused on weight loss given memory los on lipitor and her desire to avoid atorvastatin. No myalgias. On asa 81 for primary prevention Lab Results  Component Value Date   CHOL 194 05/09/2015   HDL 64.30 05/09/2015   LDLCALC 105 (H) 06/11/2014   LDLDIRECT 144.0 01/14/2016   TRIG 54.0 06/11/2014   CHOLHDL 3 06/11/2014   A/P: update lipids today

## 2016-10-19 NOTE — Assessment & Plan Note (Signed)
S: echo 2016 shows mild aortic regurgitation. asymptomatic A/P: reasonable to repeat next year - echo

## 2016-10-19 NOTE — Assessment & Plan Note (Signed)
S: psoriatic arthritis (possibly) followed by [facility] now- thought not likely psoriatic either- does have spinal stenosis- suspect erosive osteoarthritis. Prior opinion from Dr.O rourke at [facility] this is not psoriatic. She has been instructed to have q6 month labs.   Currently her pain has improved pain some but being watched closely due to potential glaucoma. She is currently working on pressures nad does have some vision loss Current meds- plaquenil A/P: will update  Cbc and cmp every 6 months but had this 2 months ago at [facility]

## 2016-10-19 NOTE — Telephone Encounter (Addendum)
Pt would like to know if she can redo her labs again in 6 months instead of a year. Pt is going to emphasize diet and exercise.  Hopes this will make a difference before a year.

## 2016-10-19 NOTE — Assessment & Plan Note (Signed)
S: controlled on lisinopril 10mg .  ASCVD 10 year risk calculation if age 72-79: update lipids today to calculate. She had memory loss on lipitor so wants to avoid statin.  BP Readings from Last 3 Encounters:  10/19/16 132/76  03/01/16 125/69  01/14/16 136/78  A/P: We discussed blood pressure goal of <140/90. Continue current meds

## 2016-10-19 NOTE — Patient Instructions (Signed)
Please stop by lab before you go   

## 2016-10-20 NOTE — Telephone Encounter (Signed)
Yes thanks but full panel not covered. Please order HDL, direct LDL, total cholesterol only under hyperlipidemia.

## 2016-10-21 ENCOUNTER — Other Ambulatory Visit: Payer: Self-pay

## 2016-10-21 DIAGNOSIS — E785 Hyperlipidemia, unspecified: Secondary | ICD-10-CM

## 2016-10-21 NOTE — Telephone Encounter (Signed)
Orders entered in computer for future labs. I called the patient to let her know.

## 2016-11-01 ENCOUNTER — Ambulatory Visit
Admission: RE | Admit: 2016-11-01 | Discharge: 2016-11-01 | Disposition: A | Discharge status: Home / Self Care | Payer: Medicare Other | Source: Ambulatory Visit | Attending: Obstetrics and Gynecology | Admitting: Obstetrics and Gynecology

## 2016-11-01 DIAGNOSIS — Z1231 Encounter for screening mammogram for malignant neoplasm of breast: Secondary | ICD-10-CM | POA: Diagnosis not present

## 2016-11-11 DIAGNOSIS — M154 Erosive (osteo)arthritis: Secondary | ICD-10-CM | POA: Diagnosis not present

## 2016-11-11 DIAGNOSIS — M1990 Unspecified osteoarthritis, unspecified site: Secondary | ICD-10-CM | POA: Diagnosis not present

## 2016-11-11 DIAGNOSIS — M4316 Spondylolisthesis, lumbar region: Secondary | ICD-10-CM | POA: Diagnosis not present

## 2016-11-25 DIAGNOSIS — M154 Erosive (osteo)arthritis: Secondary | ICD-10-CM | POA: Diagnosis not present

## 2016-12-01 DIAGNOSIS — M19042 Primary osteoarthritis, left hand: Secondary | ICD-10-CM | POA: Diagnosis not present

## 2016-12-01 DIAGNOSIS — M79641 Pain in right hand: Secondary | ICD-10-CM | POA: Diagnosis not present

## 2016-12-01 DIAGNOSIS — M19041 Primary osteoarthritis, right hand: Secondary | ICD-10-CM | POA: Diagnosis not present

## 2016-12-01 DIAGNOSIS — M79642 Pain in left hand: Secondary | ICD-10-CM | POA: Diagnosis not present

## 2016-12-21 ENCOUNTER — Ambulatory Visit: Payer: Medicare Other | Admitting: Family Medicine

## 2017-01-24 DIAGNOSIS — M4856XA Collapsed vertebra, not elsewhere classified, lumbar region, initial encounter for fracture: Secondary | ICD-10-CM | POA: Diagnosis not present

## 2017-01-24 DIAGNOSIS — M129 Arthropathy, unspecified: Secondary | ICD-10-CM | POA: Diagnosis not present

## 2017-01-24 DIAGNOSIS — Y33XXXS Other specified events, undetermined intent, sequela: Secondary | ICD-10-CM | POA: Diagnosis not present

## 2017-01-24 DIAGNOSIS — M5136 Other intervertebral disc degeneration, lumbar region: Secondary | ICD-10-CM | POA: Diagnosis not present

## 2017-01-24 DIAGNOSIS — S32059S Unspecified fracture of fifth lumbar vertebra, sequela: Secondary | ICD-10-CM | POA: Diagnosis not present

## 2017-01-24 DIAGNOSIS — M48061 Spinal stenosis, lumbar region without neurogenic claudication: Secondary | ICD-10-CM | POA: Diagnosis not present

## 2017-01-24 DIAGNOSIS — M436 Torticollis: Secondary | ICD-10-CM | POA: Diagnosis not present

## 2017-01-24 DIAGNOSIS — G8929 Other chronic pain: Secondary | ICD-10-CM | POA: Diagnosis not present

## 2017-01-24 DIAGNOSIS — M5126 Other intervertebral disc displacement, lumbar region: Secondary | ICD-10-CM | POA: Diagnosis not present

## 2017-01-24 DIAGNOSIS — M4316 Spondylolisthesis, lumbar region: Secondary | ICD-10-CM | POA: Diagnosis not present

## 2017-01-24 DIAGNOSIS — M546 Pain in thoracic spine: Secondary | ICD-10-CM | POA: Diagnosis not present

## 2017-01-24 DIAGNOSIS — M542 Cervicalgia: Secondary | ICD-10-CM | POA: Diagnosis not present

## 2017-01-24 DIAGNOSIS — Z8781 Personal history of (healed) traumatic fracture: Secondary | ICD-10-CM | POA: Diagnosis not present

## 2017-01-24 DIAGNOSIS — M545 Low back pain: Secondary | ICD-10-CM | POA: Diagnosis not present

## 2017-01-24 DIAGNOSIS — M549 Dorsalgia, unspecified: Secondary | ICD-10-CM | POA: Diagnosis not present

## 2017-01-25 DIAGNOSIS — H16143 Punctate keratitis, bilateral: Secondary | ICD-10-CM | POA: Diagnosis not present

## 2017-01-25 DIAGNOSIS — H04123 Dry eye syndrome of bilateral lacrimal glands: Secondary | ICD-10-CM | POA: Diagnosis not present

## 2017-01-25 DIAGNOSIS — H5711 Ocular pain, right eye: Secondary | ICD-10-CM | POA: Diagnosis not present

## 2017-02-02 ENCOUNTER — Ambulatory Visit (INDEPENDENT_AMBULATORY_CARE_PROVIDER_SITE_OTHER): Payer: Medicare Other | Admitting: *Deleted

## 2017-02-02 DIAGNOSIS — Z23 Encounter for immunization: Secondary | ICD-10-CM

## 2017-02-14 DIAGNOSIS — M542 Cervicalgia: Secondary | ICD-10-CM | POA: Diagnosis not present

## 2017-02-16 DIAGNOSIS — M542 Cervicalgia: Secondary | ICD-10-CM | POA: Diagnosis not present

## 2017-02-21 DIAGNOSIS — M542 Cervicalgia: Secondary | ICD-10-CM | POA: Diagnosis not present

## 2017-02-22 DIAGNOSIS — Z79899 Other long term (current) drug therapy: Secondary | ICD-10-CM | POA: Diagnosis not present

## 2017-02-22 DIAGNOSIS — M154 Erosive (osteo)arthritis: Secondary | ICD-10-CM | POA: Diagnosis not present

## 2017-02-23 DIAGNOSIS — M542 Cervicalgia: Secondary | ICD-10-CM | POA: Diagnosis not present

## 2017-03-01 DIAGNOSIS — L658 Other specified nonscarring hair loss: Secondary | ICD-10-CM | POA: Diagnosis not present

## 2017-03-01 DIAGNOSIS — L821 Other seborrheic keratosis: Secondary | ICD-10-CM | POA: Diagnosis not present

## 2017-03-01 DIAGNOSIS — Z85828 Personal history of other malignant neoplasm of skin: Secondary | ICD-10-CM | POA: Diagnosis not present

## 2017-03-02 DIAGNOSIS — M542 Cervicalgia: Secondary | ICD-10-CM | POA: Diagnosis not present

## 2017-03-04 DIAGNOSIS — M542 Cervicalgia: Secondary | ICD-10-CM | POA: Diagnosis not present

## 2017-03-10 DIAGNOSIS — M542 Cervicalgia: Secondary | ICD-10-CM | POA: Diagnosis not present

## 2017-03-14 DIAGNOSIS — M5135 Other intervertebral disc degeneration, thoracolumbar region: Secondary | ICD-10-CM | POA: Diagnosis not present

## 2017-03-14 DIAGNOSIS — Y33XXXD Other specified events, undetermined intent, subsequent encounter: Secondary | ICD-10-CM | POA: Diagnosis not present

## 2017-03-14 DIAGNOSIS — M546 Pain in thoracic spine: Secondary | ICD-10-CM | POA: Diagnosis not present

## 2017-03-14 DIAGNOSIS — M4316 Spondylolisthesis, lumbar region: Secondary | ICD-10-CM | POA: Diagnosis not present

## 2017-03-14 DIAGNOSIS — S32050D Wedge compression fracture of fifth lumbar vertebra, subsequent encounter for fracture with routine healing: Secondary | ICD-10-CM | POA: Diagnosis not present

## 2017-03-14 DIAGNOSIS — S32000S Wedge compression fracture of unspecified lumbar vertebra, sequela: Secondary | ICD-10-CM | POA: Diagnosis not present

## 2017-03-14 DIAGNOSIS — M47817 Spondylosis without myelopathy or radiculopathy, lumbosacral region: Secondary | ICD-10-CM | POA: Diagnosis not present

## 2017-03-14 DIAGNOSIS — M533 Sacrococcygeal disorders, not elsewhere classified: Secondary | ICD-10-CM | POA: Diagnosis not present

## 2017-03-25 DIAGNOSIS — M8580 Other specified disorders of bone density and structure, unspecified site: Secondary | ICD-10-CM | POA: Diagnosis not present

## 2017-03-25 DIAGNOSIS — M533 Sacrococcygeal disorders, not elsewhere classified: Secondary | ICD-10-CM | POA: Diagnosis not present

## 2017-03-25 DIAGNOSIS — M48061 Spinal stenosis, lumbar region without neurogenic claudication: Secondary | ICD-10-CM | POA: Diagnosis not present

## 2017-03-25 DIAGNOSIS — M4856XA Collapsed vertebra, not elsewhere classified, lumbar region, initial encounter for fracture: Secondary | ICD-10-CM | POA: Diagnosis not present

## 2017-03-25 DIAGNOSIS — S32050D Wedge compression fracture of fifth lumbar vertebra, subsequent encounter for fracture with routine healing: Secondary | ICD-10-CM | POA: Diagnosis not present

## 2017-04-06 DIAGNOSIS — M542 Cervicalgia: Secondary | ICD-10-CM | POA: Diagnosis not present

## 2017-04-13 DIAGNOSIS — M542 Cervicalgia: Secondary | ICD-10-CM | POA: Diagnosis not present

## 2017-04-26 DIAGNOSIS — H401132 Primary open-angle glaucoma, bilateral, moderate stage: Secondary | ICD-10-CM | POA: Diagnosis not present

## 2017-05-09 ENCOUNTER — Ambulatory Visit: Payer: Medicare Other | Admitting: Family Medicine

## 2017-05-31 ENCOUNTER — Telehealth: Payer: Self-pay

## 2017-05-31 NOTE — Telephone Encounter (Signed)
Call to Destiny Acosta. She is scheduled to see Dr. Yong Channel at 8:45 on Thursday and noted AWV is due. Left VM to see if she can come in at 8 or 8:15 to complete AWV prior to seeing Dr. Yong Channel. Left direct number to confirm if coming. [phone].  Wynetta Fines RN

## 2017-06-01 ENCOUNTER — Encounter: Payer: Self-pay | Admitting: Family Medicine

## 2017-06-01 ENCOUNTER — Ambulatory Visit (INDEPENDENT_AMBULATORY_CARE_PROVIDER_SITE_OTHER): Payer: Medicare Other | Admitting: Family Medicine

## 2017-06-01 VITALS — BP 122/86 | HR 72 | Temp 97.4°F | Ht 64.0 in | Wt 152.8 lb

## 2017-06-01 DIAGNOSIS — I10 Essential (primary) hypertension: Secondary | ICD-10-CM | POA: Diagnosis not present

## 2017-06-01 DIAGNOSIS — M48061 Spinal stenosis, lumbar region without neurogenic claudication: Secondary | ICD-10-CM | POA: Diagnosis not present

## 2017-06-01 DIAGNOSIS — M8588 Other specified disorders of bone density and structure, other site: Secondary | ICD-10-CM | POA: Diagnosis not present

## 2017-06-01 DIAGNOSIS — S32050A Wedge compression fracture of fifth lumbar vertebra, initial encounter for closed fracture: Secondary | ICD-10-CM

## 2017-06-01 DIAGNOSIS — I351 Nonrheumatic aortic (valve) insufficiency: Secondary | ICD-10-CM

## 2017-06-01 DIAGNOSIS — H409 Unspecified glaucoma: Secondary | ICD-10-CM

## 2017-06-01 DIAGNOSIS — E785 Hyperlipidemia, unspecified: Secondary | ICD-10-CM

## 2017-06-01 DIAGNOSIS — M154 Erosive (osteo)arthritis: Secondary | ICD-10-CM

## 2017-06-01 LAB — CBC
HCT: 39.2 % (ref 36.0–46.0)
HEMOGLOBIN: 13.4 g/dL (ref 12.0–15.0)
MCHC: 34.3 g/dL (ref 30.0–36.0)
MCV: 92.2 fl (ref 78.0–100.0)
Platelets: 284 10*3/uL (ref 150.0–400.0)
RBC: 4.25 Mil/uL (ref 3.87–5.11)
RDW: 13.1 % (ref 11.5–15.5)
WBC: 3.8 10*3/uL — AB (ref 4.0–10.5)

## 2017-06-01 LAB — LDL CHOLESTEROL, DIRECT: LDL DIRECT: 119 mg/dL

## 2017-06-01 LAB — COMPREHENSIVE METABOLIC PANEL
ALBUMIN: 4.5 g/dL (ref 3.5–5.2)
ALK PHOS: 49 U/L (ref 39–117)
ALT: 18 U/L (ref 0–35)
AST: 21 U/L (ref 0–37)
BUN: 13 mg/dL (ref 6–23)
CO2: 23 mEq/L (ref 19–32)
Calcium: 9.8 mg/dL (ref 8.4–10.5)
Chloride: 101 mEq/L (ref 96–112)
Creatinine, Ser: 0.61 mg/dL (ref 0.40–1.20)
GFR: 102.3 mL/min (ref >60.00)
Glucose, Bld: 82 mg/dL (ref 70–99)
Potassium: 4.7 mEq/L (ref 3.5–5.1)
Sodium: 139 mEq/L (ref 135–145)
TOTAL PROTEIN: 7.3 g/dL (ref 6.0–8.3)
Total Bilirubin: 0.8 mg/dL (ref 0.2–1.2)

## 2017-06-01 LAB — VITAMIN D 25 HYDROXY (VIT D DEFICIENCY, FRACTURES): VITD: 49.74 ng/mL (ref 30.00–100.00)

## 2017-06-01 NOTE — Assessment & Plan Note (Signed)
S: poorly controlled on no statin but had memory loss on lipitor and she wants to avoid statin. She is on asa 81mg  for prevention  Lab Results  Component Value Date   CHOL 188 10/19/2016   HDL 59.00 10/19/2016   LDLCALC 112 (H) 10/19/2016   LDLDIRECT 144.0 01/14/2016   TRIG 89.0 10/19/2016   CHOLHDL 3 10/19/2016   A/P: update LDL

## 2017-06-01 NOTE — Assessment & Plan Note (Signed)
Glaucoma- followed in the past  [place]th[place] and has seen [facility] as well. She is now being seen by Dr. Jola Schmidt here locally.

## 2017-06-01 NOTE — Progress Notes (Signed)
Subjective:  Destiny Acosta is a 72 y.o. year old very pleasant female patient who presents for/with See problem oriented charting ROS- continued issues with back pain seeing ortho, also seeing optho for blurry vision. No chest pain, shortness of breath, lightheadedness   Past Medical History-  Patient Active Problem List   Diagnosis Date Noted  . Spinal stenosis of lumbar region 01/14/2016    Priority: Medium  . Glaucoma 04/16/2015    Priority: Medium  . Suprapubic discomfort 09/17/2014    Priority: Medium  . Erosive osteoarthritis of both hands 08/10/2011    Priority: Medium  . Mild aortic regurgitation 05/14/2010    Priority: Medium  . Hyperlipidemia 12/06/2006    Priority: Medium  . Hypertension 12/06/2006    Priority: Medium  . Palpitations 04/16/2015    Priority: Low  . Allergic rhinitis 06/10/2014    Priority: Low  . Insomnia 06/10/2014    Priority: Low  . History of adenomatous polyp of colon 12/21/2006    Priority: Low    Medications- reviewed and updated Current Outpatient Medications  Medication Sig Dispense Refill  . aspirin EC 81 MG tablet Take 81 mg by mouth daily.    . brimonidine (ALPHAGAN) 0.2 % ophthalmic solution Place 1 drop into the right eye 3 (three) times daily.    . Calcium Carbonate-Vitamin D (CALTRATE 600+D) 600-400 MG-UNIT per tablet Take 1 tablet by mouth daily.      . Doxylamine Succinate, Sleep, (UNISOM) 25 MG tablet Take 25 mg by mouth at bedtime as needed.      . Estradiol (ESTRACE VA) Place vaginally once a week.    . fexofenadine (ALLEGRA) 180 MG tablet Take 180 mg by mouth daily as needed.     . finasteride (PROSCAR) 5 MG tablet Take 5 mg by mouth daily.    . hydroxychloroquine (PLAQUENIL) 200 MG tablet Take 400 mg by mouth daily.    Marland Kitchen ibuprofen (ADVIL,MOTRIN) 200 MG tablet Take 200 mg by mouth as needed. pain    . ketorolac (ACULAR) 0.5 % ophthalmic solution Place 1 drop into both eyes 4 (four) times daily.    Marland Kitchen latanoprost (XALATAN)  0.005 % ophthalmic solution Place 1 drop into both eyes at bedtime.    Marland Kitchen lisinopril (PRINIVIL,ZESTRIL) 10 MG tablet Take 1 tablet (10 mg total) by mouth daily. 30 tablet 11  . Magnesium 250 MG TABS Take 1 tablet by mouth 2 (two) times daily.      . Minoxidil 5 % FOAM Apply 1 application topically daily.    . Multiple Vitamin (MULTIVITAMIN) capsule Take 1 capsule by mouth daily.      Marland Kitchen tretinoin (RETIN-A) 0.1 % cream Apply topically at bedtime.       No current facility-administered medications for this visit.     Objective: BP 122/86 (BP Location: Left Arm, Patient Position: Sitting, Cuff Size: Large)   Pulse 72   Temp (!) 97.4 F (36.3 C) (Oral)   Ht 5\' 4"  (1.626 m)   Wt 152 lb 12.8 oz (69.3 kg)   SpO2 97%   BMI 26.23 kg/m  Gen: NAD, resting comfortably CV: RRR. Stable murmur 3/6 Lungs: CTAB no crackles, wheeze, rhonchi Abdomen: soft/nontender/nondistended/normal bowel sounds. No rebound or guarding.  Ext: no edema Skin: warm, dry  Assessment/Plan:  Also has osteopenia lumbar spine and ortho wants vitamin D  Erosive osteoarthritis of both hands Reviewed [facility] notes- they state not psoriatic arthritis but instead erosive osteoarthritis. She will continue to follow with [facility].  Patient is on aleve now on daily basis in addition to her plaquenil through [facility] for her osteoarthritis. She is on aleve on a daily basis [facility] still wants q6 months cbc, cmp  Glaucoma Glaucoma- followed in the past  [place] and has seen [facility] as well. She is now being seen by Dr. Jola Schmidt here locally.   Spinal stenosis of lumbar region Follows with [facility] now for spinal stenosis in addition to [place] [facility]. She has had a compression fracture at L5- and they have asked for a vitamin D to be done  Hyperlipidemia S: poorly controlled on no statin but had memory loss on lipitor and she wants to avoid statin. She is on asa 81mg  for prevention  Lab Results   Component Value Date   CHOL 188 10/19/2016   HDL 59.00 10/19/2016   LDLCALC 112 (H) 10/19/2016   LDLDIRECT 144.0 01/14/2016   TRIG 89.0 10/19/2016   CHOLHDL 3 10/19/2016   A/P: update LDL  Hypertension S: controlled on lisinopril 10mg  BP Readings from Last 3 Encounters:  06/01/17 122/86  10/19/16 132/76  03/01/16 125/69  A/P: blood pressure goal of <140/90. Continue current meds:  Doing well  Mild aortic regurgitation S: asymptomatic. She had echo 2016 that showed mild aortic regurgitation. We had discussed repeat this year.  A/P: she prefers to wait until 6 month visit  Return in about 6 months (around 11/29/2017) for follow up- or sooner if needed. AWV at that time as well  Lab/Order associations: Hyperlipidemia, unspecified hyperlipidemia type - Plan: CBC, Comprehensive metabolic panel, LDL cholesterol, direct  Essential hypertension - Plan: CBC, Comprehensive metabolic panel  Closed compression fracture of fifth lumbar vertebra, initial encounter ([place]) - Plan: VITAMIN D 25 Hydroxy (Vit-D Deficiency, Fractures)  Osteopenia of lumbar spine - Plan: VITAMIN D 25 Hydroxy (Vit-D Deficiency, Fractures)  Return precautions advised.  Garret Reddish, MD

## 2017-06-01 NOTE — Assessment & Plan Note (Signed)
S: controlled on lisinopril 10mg  BP Readings from Last 3 Encounters:  06/01/17 122/86  10/19/16 132/76  03/01/16 125/69  A/P: blood pressure goal of <140/90. Continue current meds:  Doing well

## 2017-06-01 NOTE — Patient Instructions (Addendum)
Please stop by lab before you go  No changes in medicine today  At your 6 month visit you could also schedule for an annual wellness visit with our nurse Cassie or Manuela Schwartz on the same day (can also do this sooner if you prefer)

## 2017-06-01 NOTE — Assessment & Plan Note (Signed)
S: asymptomatic. She had echo 2016 that showed mild aortic regurgitation. We had discussed repeat this year.  A/P: she prefers to wait until 6 month visit

## 2017-06-01 NOTE — Assessment & Plan Note (Signed)
Follows with [facility] now for spinal stenosis in addition to [facility] rheumatology. She has had a compression fracture at L5- and they have asked for a vitamin D to be done

## 2017-06-01 NOTE — Assessment & Plan Note (Addendum)
Reviewed [facility] notes- they state not psoriatic arthritis but instead erosive osteoarthritis. She will continue to follow with [facility]. Patient is on aleve now on daily basis in addition to her plaquenil through [facility] for her osteoarthritis. She is on aleve on a daily basis [facility] still wants q6 months cbc, cmp

## 2017-06-02 ENCOUNTER — Encounter: Payer: Self-pay | Admitting: Family Medicine

## 2017-06-17 DIAGNOSIS — M4802 Spinal stenosis, cervical region: Secondary | ICD-10-CM | POA: Diagnosis not present

## 2017-06-17 DIAGNOSIS — M7071 Other bursitis of hip, right hip: Secondary | ICD-10-CM | POA: Diagnosis not present

## 2017-06-17 DIAGNOSIS — M8580 Other specified disorders of bone density and structure, unspecified site: Secondary | ICD-10-CM | POA: Diagnosis not present

## 2017-06-17 DIAGNOSIS — M76899 Other specified enthesopathies of unspecified lower limb, excluding foot: Secondary | ICD-10-CM | POA: Diagnosis not present

## 2017-06-17 DIAGNOSIS — M25551 Pain in right hip: Secondary | ICD-10-CM | POA: Diagnosis not present

## 2017-07-03 DIAGNOSIS — R112 Nausea with vomiting, unspecified: Secondary | ICD-10-CM | POA: Diagnosis not present

## 2017-07-11 DIAGNOSIS — M48061 Spinal stenosis, lumbar region without neurogenic claudication: Secondary | ICD-10-CM | POA: Diagnosis not present

## 2017-07-11 DIAGNOSIS — Z7982 Long term (current) use of aspirin: Secondary | ICD-10-CM | POA: Diagnosis not present

## 2017-07-11 DIAGNOSIS — M7071 Other bursitis of hip, right hip: Secondary | ICD-10-CM | POA: Diagnosis not present

## 2017-07-11 DIAGNOSIS — Z79899 Other long term (current) drug therapy: Secondary | ICD-10-CM | POA: Diagnosis not present

## 2017-07-11 DIAGNOSIS — Z85828 Personal history of other malignant neoplasm of skin: Secondary | ICD-10-CM | POA: Diagnosis not present

## 2017-07-11 DIAGNOSIS — M533 Sacrococcygeal disorders, not elsewhere classified: Secondary | ICD-10-CM | POA: Diagnosis not present

## 2017-07-22 DIAGNOSIS — M7918 Myalgia, other site: Secondary | ICD-10-CM | POA: Diagnosis not present

## 2017-07-22 DIAGNOSIS — M5431 Sciatica, right side: Secondary | ICD-10-CM | POA: Diagnosis not present

## 2017-07-22 DIAGNOSIS — M533 Sacrococcygeal disorders, not elsewhere classified: Secondary | ICD-10-CM | POA: Diagnosis not present

## 2017-07-25 DIAGNOSIS — H04123 Dry eye syndrome of bilateral lacrimal glands: Secondary | ICD-10-CM | POA: Diagnosis not present

## 2017-07-25 DIAGNOSIS — H401132 Primary open-angle glaucoma, bilateral, moderate stage: Secondary | ICD-10-CM | POA: Diagnosis not present

## 2017-08-05 DIAGNOSIS — M19041 Primary osteoarthritis, right hand: Secondary | ICD-10-CM | POA: Diagnosis not present

## 2017-08-05 DIAGNOSIS — M19042 Primary osteoarthritis, left hand: Secondary | ICD-10-CM | POA: Diagnosis not present

## 2017-08-05 DIAGNOSIS — Z79899 Other long term (current) drug therapy: Secondary | ICD-10-CM | POA: Diagnosis not present

## 2017-08-30 DIAGNOSIS — L658 Other specified nonscarring hair loss: Secondary | ICD-10-CM | POA: Diagnosis not present

## 2017-08-30 DIAGNOSIS — Z79899 Other long term (current) drug therapy: Secondary | ICD-10-CM | POA: Diagnosis not present

## 2017-08-30 DIAGNOSIS — Z85828 Personal history of other malignant neoplasm of skin: Secondary | ICD-10-CM | POA: Diagnosis not present

## 2017-08-30 DIAGNOSIS — B351 Tinea unguium: Secondary | ICD-10-CM | POA: Diagnosis not present

## 2017-08-30 DIAGNOSIS — L821 Other seborrheic keratosis: Secondary | ICD-10-CM | POA: Diagnosis not present

## 2017-09-09 DIAGNOSIS — M5417 Radiculopathy, lumbosacral region: Secondary | ICD-10-CM | POA: Diagnosis not present

## 2017-09-09 DIAGNOSIS — M48061 Spinal stenosis, lumbar region without neurogenic claudication: Secondary | ICD-10-CM | POA: Diagnosis not present

## 2017-09-12 DIAGNOSIS — Z79899 Other long term (current) drug therapy: Secondary | ICD-10-CM | POA: Diagnosis not present

## 2017-09-12 DIAGNOSIS — H35362 Drusen (degenerative) of macula, left eye: Secondary | ICD-10-CM | POA: Diagnosis not present

## 2017-09-12 DIAGNOSIS — D3131 Benign neoplasm of right choroid: Secondary | ICD-10-CM | POA: Diagnosis not present

## 2017-09-12 DIAGNOSIS — H43813 Vitreous degeneration, bilateral: Secondary | ICD-10-CM | POA: Diagnosis not present

## 2017-09-20 ENCOUNTER — Other Ambulatory Visit: Payer: Self-pay | Admitting: Obstetrics and Gynecology

## 2017-09-20 DIAGNOSIS — Z1231 Encounter for screening mammogram for malignant neoplasm of breast: Secondary | ICD-10-CM

## 2017-10-06 DIAGNOSIS — Z7982 Long term (current) use of aspirin: Secondary | ICD-10-CM | POA: Diagnosis not present

## 2017-10-06 DIAGNOSIS — M5416 Radiculopathy, lumbar region: Secondary | ICD-10-CM | POA: Diagnosis not present

## 2017-10-06 DIAGNOSIS — M8580 Other specified disorders of bone density and structure, unspecified site: Secondary | ICD-10-CM | POA: Diagnosis not present

## 2017-10-06 DIAGNOSIS — M4726 Other spondylosis with radiculopathy, lumbar region: Secondary | ICD-10-CM | POA: Diagnosis not present

## 2017-10-06 DIAGNOSIS — H409 Unspecified glaucoma: Secondary | ICD-10-CM | POA: Diagnosis not present

## 2017-10-06 DIAGNOSIS — M48061 Spinal stenosis, lumbar region without neurogenic claudication: Secondary | ICD-10-CM | POA: Diagnosis not present

## 2017-10-06 DIAGNOSIS — M5116 Intervertebral disc disorders with radiculopathy, lumbar region: Secondary | ICD-10-CM | POA: Diagnosis not present

## 2017-10-06 DIAGNOSIS — Z79899 Other long term (current) drug therapy: Secondary | ICD-10-CM | POA: Diagnosis not present

## 2017-10-06 DIAGNOSIS — M4316 Spondylolisthesis, lumbar region: Secondary | ICD-10-CM | POA: Diagnosis not present

## 2017-10-06 DIAGNOSIS — Z85828 Personal history of other malignant neoplasm of skin: Secondary | ICD-10-CM | POA: Diagnosis not present

## 2017-10-10 ENCOUNTER — Ambulatory Visit (INDEPENDENT_AMBULATORY_CARE_PROVIDER_SITE_OTHER): Payer: Medicare Other | Admitting: Family Medicine

## 2017-10-10 ENCOUNTER — Encounter: Payer: Self-pay | Admitting: Family Medicine

## 2017-10-10 VITALS — BP 130/88 | HR 68 | Temp 97.9°F | Ht 64.0 in | Wt 151.6 lb

## 2017-10-10 DIAGNOSIS — I10 Essential (primary) hypertension: Secondary | ICD-10-CM | POA: Diagnosis not present

## 2017-10-10 DIAGNOSIS — I351 Nonrheumatic aortic (valve) insufficiency: Secondary | ICD-10-CM | POA: Diagnosis not present

## 2017-10-10 DIAGNOSIS — E785 Hyperlipidemia, unspecified: Secondary | ICD-10-CM

## 2017-10-10 NOTE — Patient Instructions (Signed)
We will call you within two weeks about your referral for echocardiogram. If you do not hear within 3 weeks, give [place] a call.   You are medically maximized for your surgery. I wish you the best in making the right decision for you.

## 2017-10-10 NOTE — Assessment & Plan Note (Signed)
S: Mild poorly controlled on no Rx.  she has a 10-year ASCVD risk of over 15% but she reported memory loss on Lipitor in the past so we have kept her off of statin.  She is on aspirin 81 mg for primary prevention- we discussed inconclusive evidence in her age group for benefits compared to risks-she may stop the aspirin Lab Results  Component Value Date   CHOL 188 10/19/2016   HDL 59.00 10/19/2016   LDLCALC 112 (H) 10/19/2016   LDLDIRECT 119.0 06/01/2017   TRIG 89.0 10/19/2016   CHOLHDL 3 10/19/2016   A/P: stop the aspirin. Continue off statin due to prior memory loss

## 2017-10-10 NOTE — Progress Notes (Signed)
Subjective:  Destiny Acosta is a 72 y.o. year old very pleasant female patient who presents for/with See problem oriented charting ROS- No chest pain or shortness of breath. No headache or blurry vision. Does have some back pain and into right leg   Past Medical History-  Patient Active Problem List   Diagnosis Date Noted  . Spinal stenosis of lumbar region 01/14/2016    Priority: Medium  . Glaucoma 04/16/2015    Priority: Medium  . Suprapubic discomfort 09/17/2014    Priority: Medium  . Erosive osteoarthritis of both hands 08/10/2011    Priority: Medium  . Mild aortic regurgitation 05/14/2010    Priority: Medium  . Hyperlipidemia 12/06/2006    Priority: Medium  . Hypertension 12/06/2006    Priority: Medium  . Palpitations 04/16/2015    Priority: Low  . Allergic rhinitis 06/10/2014    Priority: Low  . Insomnia 06/10/2014    Priority: Low  . History of adenomatous polyp of colon 12/21/2006    Priority: Low    Medications- reviewed and updated Current Outpatient Medications  Medication Sig Dispense Refill  . Calcium Carbonate-Vitamin D (CALTRATE 600+D) 600-400 MG-UNIT per tablet Take 1 tablet by mouth daily.      . Estradiol (ESTRACE VA) Place vaginally once a week.    . fexofenadine (ALLEGRA) 180 MG tablet Take 180 mg by mouth daily as needed.     . finasteride (PROSCAR) 5 MG tablet Take 5 mg by mouth daily.    Marland Kitchen latanoprost (XALATAN) 0.005 % ophthalmic solution Place 1 drop into both eyes at bedtime.    Marland Kitchen lisinopril (PRINIVIL,ZESTRIL) 10 MG tablet Take 1 tablet (10 mg total) by mouth daily. 30 tablet 11  . Magnesium 250 MG TABS Take 1 tablet by mouth 2 (two) times daily.      . Minoxidil 5 % FOAM Apply 1 application topically daily.    . Multiple Vitamin (MULTIVITAMIN) capsule Take 1 capsule by mouth daily.      . naproxen sodium (ALEVE) 220 MG tablet Take 220 mg by mouth daily as needed.    . tretinoin (RETIN-A) 0.1 % cream Apply topically at bedtime.       No current  facility-administered medications for this visit.     Objective: BP 130/88 (BP Location: Left Arm, Patient Position: Sitting, Cuff Size: Normal)   Pulse 68   Temp 97.9 F (36.6 C) (Oral)   Ht 5\' 4"  (1.626 m)   Wt 151 lb 9.6 oz (68.8 kg)   SpO2 97%   BMI 26.02 kg/m  Gen: NAD, resting comfortably CV: RRR no murmurs rubs or gallops Lungs: CTAB no crackles, wheeze, rhonchi Abdomen: soft/nontender/nondistended/normal bowel sounds.  Ext: no edema Skin: warm, dry patient is still pregnant along face but  Assessment/Plan:  Patient presents for medical maximization evaluation for surgery for L4-L5 laminectomy.  Patient with radiculopathy right sided and failed over 6 weeks of conservative treatment (she states actually 2 years of symptoms).  She has known history of spinal stenosis. Injections only help short term. She is able to complete 4 mets of activity without chest pain or shortness of breath. See discussion below- but she is medically maximized to proceed with surgery.   Hypertension S: controlled on lisinopril 10 mg last visit. Didn't take med today and slightly higher- very close to diastolic goal BP Readings from Last 3 Encounters:  10/10/17 130/88  06/01/17 122/86  10/19/16 132/76  A/P: We discussed blood pressure goal of <140/90. Continue current meds-  encouraged her to take the medicine daily unless symptomatic like lightheaded/dizzy  Hyperlipidemia S: Mild poorly controlled on no Rx.  she has a 10-year ASCVD risk of over 15% but she reported memory loss on Lipitor in the past so we have kept her off of statin.  She is on aspirin 81 mg for primary prevention- we discussed inconclusive evidence in her age group for benefits compared to risks-she may stop the aspirin Lab Results  Component Value Date   CHOL 188 10/19/2016   HDL 59.00 10/19/2016   LDLCALC 112 (H) 10/19/2016   LDLDIRECT 119.0 06/01/2017   TRIG 89.0 10/19/2016   CHOLHDL 3 10/19/2016   A/P: stop the aspirin.  Continue off statin due to prior memory loss  Mild aortic regurgitation S: Patient remains asymptomatic.  She had an echocardiogram in 2016 that showed mild aortic regurgitation.  In January patient asked to postpone repeat evaluation until 12-month visit A/P: Offered her early echocardiogram today-we will update this. Since she is asymptomatic- unlikely even if this has progressed that she would not be a good candidate for surgery so I went ahead and signed off on surgical clearance/medical maximization paperowrk.    Future Appointments  Date Time Provider [place]  11/04/2017 10:40 AM [facility]  12/01/2017  1:30 PM Marin Olp, MD [facility]  12/01/2017  2:00 PM Williemae Area, RN [facility]   Lab/Order associations: Mild aortic regurgitation - Plan: ECHOCARDIOGRAM COMPLETE  Return precautions advised.  Garret Reddish, MD

## 2017-10-10 NOTE — Assessment & Plan Note (Addendum)
S: Patient remains asymptomatic.  She had an echocardiogram in 2016 that showed mild aortic regurgitation.  In January patient asked to postpone repeat evaluation until 63-month visit A/P: Offered her early echocardiogram today-we will update this. Since she is asymptomatic- unlikely even if this has progressed that she would not be a good candidate for surgery so I went ahead and signed off on surgical clearance/medical maximization paperowrk.

## 2017-10-10 NOTE — Assessment & Plan Note (Signed)
S: controlled on lisinopril 10 mg last visit. Didn't take med today and slightly higher- very close to diastolic goal BP Readings from Last 3 Encounters:  10/10/17 130/88  06/01/17 122/86  10/19/16 132/76  A/P: We discussed blood pressure goal of <140/90. Continue current meds- encouraged her to take the medicine daily unless symptomatic like lightheaded/dizzy

## 2017-10-11 ENCOUNTER — Ambulatory Visit (INDEPENDENT_AMBULATORY_CARE_PROVIDER_SITE_OTHER): Payer: Medicare Other

## 2017-10-11 ENCOUNTER — Ambulatory Visit (INDEPENDENT_AMBULATORY_CARE_PROVIDER_SITE_OTHER): Payer: Medicare Other | Admitting: Family Medicine

## 2017-10-11 ENCOUNTER — Telehealth: Payer: Self-pay | Admitting: Family Medicine

## 2017-10-11 ENCOUNTER — Encounter: Payer: Self-pay | Admitting: Family Medicine

## 2017-10-11 VITALS — BP 138/82 | HR 70 | Temp 97.9°F | Ht 64.0 in | Wt 150.5 lb

## 2017-10-11 DIAGNOSIS — H401132 Primary open-angle glaucoma, bilateral, moderate stage: Secondary | ICD-10-CM | POA: Diagnosis not present

## 2017-10-11 DIAGNOSIS — S79911A Unspecified injury of right hip, initial encounter: Secondary | ICD-10-CM | POA: Diagnosis not present

## 2017-10-11 DIAGNOSIS — R159 Full incontinence of feces: Secondary | ICD-10-CM | POA: Diagnosis not present

## 2017-10-11 DIAGNOSIS — M25551 Pain in right hip: Secondary | ICD-10-CM

## 2017-10-11 MED ORDER — KETOROLAC TROMETHAMINE 60 MG/2ML IM SOLN
60.0000 mg | Freq: Once | INTRAMUSCULAR | Status: AC
  Administered 2017-10-11: 60 mg via INTRAMUSCULAR

## 2017-10-11 NOTE — Telephone Encounter (Signed)
Honestly since this is a different area of her legs she probably needs to be seen in the office either by [place] or person doing her back surgery potentially

## 2017-10-11 NOTE — Progress Notes (Signed)
   Subjective:  Destiny Acosta is a 72 y.o. female who presents today for same-day appointment with a chief complaint of right hip pain.   HPI:  Right Hip Pain, acute problem Symptoms started yesterday.  Patient notes that she fell about 4 days ago.  Fall was mechanical in nature.  Reports that her shoestring got caught in her roller she fell.  Denies any head trauma or loss of consciousness during this episode.  Patient fell onto her right knee and right hip.  She did well for the next couple of days and was able to walk and bear weight however yesterday started noticing significant pain to her right hip area.  She is concerned that she may have had a fracture.  Patient is also concerned that she had an episode of bowel incontinence this morning.  Patient states that she had a sudden urge to have a bowel movement and by the time that she had got to the restroom had already had a bowel movement.  Patient was not aware that this is happening.  No lower extremity weakness or numbness.  No paresthesias.  She has tried taking Aleve without significant relief.  No other obvious aggravating or alleviating factors.  She has been able to urinate today.  ROS: Per HPI  PMH: She reports that she has never smoked. She has never used smokeless tobacco. She reports that she drinks alcohol. She reports that she does not use drugs.  Objective:  Physical Exam: BP 138/82 (BP Location: Left Arm, Patient Position: Sitting, Cuff Size: Normal)   Pulse 70   Temp 97.9 F (36.6 C) (Oral)   Ht 5\' 4"  (1.626 m)   Wt 150 lb 8 oz (68.3 kg)   SpO2 97%   BMI 25.83 kg/m   Gen: NAD, resting comfortably CV: RRR with no murmurs appreciated Pulm: NWOB, CTAB with no crackles, wheezes, or rhonchi MSK: -Back: Nontender to palpation along spinous processes and paraspinal muscles. -Right hip: Tender to palpation along inguinal crease.  No greater trochanter tenderness.  Limited internal and external rotation due to pain.   Limited flexion and extension due to pain. -Right knee: No deformities.  Strength 5 out of 5 in all directions.  Patellar reflex 1+. - Right ankle: No deformities. Strength 5/5 in all directions.  Sensation light touch intact.  Assessment/Plan:  Right hip pain Patient's plain film does not have any signs of fracture based on my read.  We will await radiology read.  She is able to think we need further testing at this time.  We will give 60 mg of Toradol today.  Advised patient continue using naproxen.  She declined prescription anti-inflammatory steroid office visit today.  Discussed reasons to return to care.  Follow-up as needed.  Bowel incontinence Concerned that patient may have worsening spinal stenosis given her right leg pain as well as some episode of bowel incontinence.  Discussed need to urgently evaluate this with rectal exam and possible stat MRI, however patient declined.  Offered prednisone for the above problems, however patient also declined this.  Discussed risks of waiting.  Patient stated she will be following up with her rheumatologist on Friday and will tell them if things are not better.  Discussed reasons to return to care and reasons to seek emergent care.  Algis Greenhouse. Jerline Pain, MD 10/11/2017 4:13 PM

## 2017-10-11 NOTE — Telephone Encounter (Signed)
Copied from [place] [phone]. Topic: Quick Communication - See Telephone Encounter >> Oct 11, 2017  9:47 AM Percell Belt A wrote: CRM for notification. See Telephone encounter for: 10/11/17. Pt was just in yesterday.  She stated she is having right leg pain and she cant put a lot of weight on that whole leg.  She forgot to mention that yesterday but would like to have xray done of the whole right leg.  This is a totally different leg then the one she is having surgery on. She would like to know if she needs to come back in or can he put in a xray for the right leg?    Please advise  [phone] best number

## 2017-10-11 NOTE — Telephone Encounter (Signed)
See note

## 2017-10-11 NOTE — Telephone Encounter (Addendum)
Relation to pt: self Call back number:[phone]  Reason for call:  Patient has an appointment at 1:30pm and would like a detail message regarding message below.  Patient wanted to clarify same leg but different area of the leg which is causing her discomfort and pain and would like to discuss with the nurse prior to 1:30pm ophthalmologist appointment, please advise

## 2017-10-11 NOTE — Patient Instructions (Signed)
It was very nice to see you today!  You do not have a hip fracture based on what I see of your xray.  The radiologist will review this and we will call you if anything else comes up.  We will give you an injection of a medication called Toradol today.  This should help you with your hip pain.  Please let me know if you change your mind about getting an MRI.  Please let me or Dr. Yong Channel know if your symptoms do not improve over the next few days.  Take care, Dr Jerline Pain

## 2017-10-11 NOTE — Telephone Encounter (Signed)
Patient scheduled with Dr. Jerline Pain today

## 2017-10-14 DIAGNOSIS — M5416 Radiculopathy, lumbar region: Secondary | ICD-10-CM | POA: Diagnosis not present

## 2017-10-14 DIAGNOSIS — R29898 Other symptoms and signs involving the musculoskeletal system: Secondary | ICD-10-CM | POA: Diagnosis not present

## 2017-10-14 DIAGNOSIS — M1990 Unspecified osteoarthritis, unspecified site: Secondary | ICD-10-CM | POA: Diagnosis not present

## 2017-10-18 ENCOUNTER — Other Ambulatory Visit: Payer: Self-pay

## 2017-10-18 ENCOUNTER — Ambulatory Visit (HOSPITAL_COMMUNITY): Payer: Medicare Other | Attending: Cardiovascular Disease

## 2017-10-18 DIAGNOSIS — I10 Essential (primary) hypertension: Secondary | ICD-10-CM | POA: Insufficient documentation

## 2017-10-18 DIAGNOSIS — I351 Nonrheumatic aortic (valve) insufficiency: Secondary | ICD-10-CM

## 2017-10-18 DIAGNOSIS — E785 Hyperlipidemia, unspecified: Secondary | ICD-10-CM | POA: Insufficient documentation

## 2017-10-28 DIAGNOSIS — S76211D Strain of adductor muscle, fascia and tendon of right thigh, subsequent encounter: Secondary | ICD-10-CM | POA: Diagnosis not present

## 2017-10-28 DIAGNOSIS — M5416 Radiculopathy, lumbar region: Secondary | ICD-10-CM | POA: Diagnosis not present

## 2017-10-28 DIAGNOSIS — S76211A Strain of adductor muscle, fascia and tendon of right thigh, initial encounter: Secondary | ICD-10-CM | POA: Diagnosis not present

## 2017-10-28 DIAGNOSIS — M48061 Spinal stenosis, lumbar region without neurogenic claudication: Secondary | ICD-10-CM | POA: Diagnosis not present

## 2017-11-04 ENCOUNTER — Ambulatory Visit: Payer: Medicare Other

## 2017-12-01 ENCOUNTER — Ambulatory Visit: Payer: Medicare Other | Admitting: *Deleted

## 2017-12-01 ENCOUNTER — Ambulatory Visit: Payer: Medicare Other | Admitting: Family Medicine

## 2017-12-02 ENCOUNTER — Other Ambulatory Visit: Payer: Self-pay | Admitting: Family Medicine

## 2017-12-02 ENCOUNTER — Telehealth: Payer: Self-pay | Admitting: Family Medicine

## 2017-12-02 NOTE — Telephone Encounter (Unsigned)
Copied from [place] [phone]. Topic: Quick Communication - Rx Refill/Question >> Dec 02, 2017 10:57 AM Percell Belt A wrote: Medication: lisinopril (PRINIVIL,ZESTRIL) 10 MG tablet [[id number]  Has the patient contacted their pharmacy? No  (Agent: If no, request that the patient contact the pharmacy for the refill.) (Agent: If yes, when and what did the pharmacy advise?)  Preferred Pharmacy (with phone number or street name):  [facility] [place] - pt completely out and is needing it as soon has possible, she stated that she has been trying all week at the pharmacy   Agent: Please be advised that RX refills may take up to 3 business days. We ask that you follow-up with your pharmacy.

## 2017-12-05 NOTE — Telephone Encounter (Signed)
Filled 12/02/17 at preferred RX

## 2018-01-03 ENCOUNTER — Ambulatory Visit (INDEPENDENT_AMBULATORY_CARE_PROVIDER_SITE_OTHER): Payer: Medicare Other | Admitting: Family Medicine

## 2018-01-03 ENCOUNTER — Encounter: Payer: Self-pay | Admitting: Family Medicine

## 2018-01-03 ENCOUNTER — Ambulatory Visit (INDEPENDENT_AMBULATORY_CARE_PROVIDER_SITE_OTHER): Payer: Medicare Other

## 2018-01-03 VITALS — BP 116/76 | HR 76 | Temp 98.3°F | Ht 64.0 in | Wt 151.0 lb

## 2018-01-03 VITALS — BP 116/76 | HR 94 | Temp 98.3°F | Ht 64.0 in | Wt 151.2 lb

## 2018-01-03 DIAGNOSIS — E785 Hyperlipidemia, unspecified: Secondary | ICD-10-CM

## 2018-01-03 DIAGNOSIS — M546 Pain in thoracic spine: Secondary | ICD-10-CM | POA: Diagnosis not present

## 2018-01-03 DIAGNOSIS — I10 Essential (primary) hypertension: Secondary | ICD-10-CM

## 2018-01-03 DIAGNOSIS — Z0000 Encounter for general adult medical examination without abnormal findings: Secondary | ICD-10-CM | POA: Diagnosis not present

## 2018-01-03 DIAGNOSIS — M47814 Spondylosis without myelopathy or radiculopathy, thoracic region: Secondary | ICD-10-CM | POA: Diagnosis not present

## 2018-01-03 NOTE — Patient Instructions (Addendum)
Ms. Blacksher , Thank you for taking time to come for your Medicare Wellness Visit. I appreciate your ongoing commitment to your health goals. Please review the following plan we discussed and let me know if I can assist you in the future.   These are the goals we discussed: Goals   None     This is a list of the screening recommended for you and due dates:  Health Maintenance  Topic Date Due  . Flu Shot  03/04/2018*  . Mammogram  11/02/2018  . Colon Cancer Screening  03/01/2021  . Tetanus Vaccine  04/19/2023  . DEXA scan (bone density measurement)  Completed  .  Hepatitis C: One time screening is recommended by Center for Disease Control  (CDC) for  adults born from [birth year] through 1966.   Completed  . Pneumonia vaccines  Completed  *Topic was postponed. The date shown is not the original due date.    Preventive Care for Adults  A healthy lifestyle and preventive care can promote health and wellness. Preventive health guidelines for adults include the following key practices.  . A routine yearly physical is a good way to check with your health care provider about your health and preventive screening. It is a chance to share any concerns and updates on your health and to receive a thorough exam.  . Visit your dentist for a routine exam and preventive care every 6 months. Brush your teeth twice a day and floss once a day. Good oral hygiene prevents tooth decay and gum disease.  . The frequency of eye exams is based on your age, health, family medical history, use  of contact lenses, and other factors. Follow your health care provider's recommendations for frequency of eye exams.  . Eat a healthy diet. Foods like vegetables, fruits, whole grains, low-fat dairy products, and lean protein foods contain the nutrients you need without too many calories. Decrease your intake of foods high in solid fats, added sugars, and salt. Eat the right amount of calories for you. Get information about a  proper diet from your health care provider, if necessary.  . Regular physical exercise is one of the most important things you can do for your health. Most adults should get at least 150 minutes of moderate-intensity exercise (any activity that increases your heart rate and causes you to sweat) each week. In addition, most adults need muscle-strengthening exercises on 2 or more days a week.  Silver Sneakers may be a benefit available to you. To determine eligibility, you may visit the website: www.silversneakers.com or contact program at 206-471-6802 Mon-Fri between 8AM-8PM.   . Maintain a healthy weight. The body mass index (BMI) is a screening tool to identify possible weight problems. It provides an estimate of body fat based on height and weight. Your health care provider can find your BMI and can help you achieve or maintain a healthy weight.   For adults 20 years and older: ? A BMI below 18.5 is considered underweight. ? A BMI of 18.5 to 24.9 is normal. ? A BMI of 25 to 29.9 is considered overweight. ? A BMI of 30 and above is considered obese.   . Maintain normal blood lipids and cholesterol levels by exercising and minimizing your intake of saturated fat. Eat a balanced diet with plenty of fruit and vegetables. Blood tests for lipids and cholesterol should begin at age 82 and be repeated every 5 years. If your lipid or cholesterol levels are high, you are over  50, or you are at high risk for heart disease, you may need your cholesterol levels checked more frequently. Ongoing high lipid and cholesterol levels should be treated with medicines if diet and exercise are not working.  . If you smoke, find out from your health care provider how to quit. If you do not use tobacco, please do not start.  . If you choose to drink alcohol, please do not consume more than 2 drinks per day. One drink is considered to be 12 ounces (355 mL) of beer, 5 ounces (148 mL) of wine, or 1.5 ounces (44 mL) of  liquor.  . If you are 67-49 years old, ask your health care provider if you should take aspirin to prevent strokes.  . Use sunscreen. Apply sunscreen liberally and repeatedly throughout the day. You should seek shade when your shadow is shorter than you. Protect yourself by wearing long sleeves, pants, a wide-brimmed hat, and sunglasses year round, whenever you are outdoors.  . Once a month, do a whole body skin exam, using a mirror to look at the skin on your back. Tell your health care provider of new moles, moles that have irregular borders, moles that are larger than a pencil eraser, or moles that have changed in shape or color.

## 2018-01-03 NOTE — Progress Notes (Signed)
Subjective:   Destiny Acosta is a 72 y.o. female who presents for Medicare Annual (Subsequent) preventive examination.  Review of Systems:  No ROS.  Medicare Wellness Visit. Additional risk factors are reflected in the social history. Cardiac Risk Factors include: advanced age (>80men, >76 women) Patient currently lives in a 2 story home with husband of 88 years. They are currently purging/packing to sell their home in November. They don't currently have a plan for where to go  but have been talking to builders. Son lives in [place]. Patient enjoys cooking, reading, and walking. Plays Chess with Grandson.  Patient goes to bed around 10pm. Gets up multiple times a night mostly due to back pain. Gets up around 5:30am. Most of the time does not feel rested when she wakes up.     Objective:     Vitals: BP 116/76 (BP Location: Left Arm, Patient Position: Sitting, Cuff Size: Large)   Pulse 94   Temp 98.3 F (36.8 C) (Oral)   Ht 5\' 4"  (1.626 m)   Wt 151 lb 3.2 oz (68.6 kg)   SpO2 92%   BMI 25.95 kg/m   Body mass index is 25.95 kg/m.  Advanced Directives 01/03/2018 03/01/2016 02/18/2016 09/24/2015  Does Patient Have a Medical Advance Directive? Yes Yes Yes Yes  Type of Paramedic of [place];Living will Living will;Healthcare Power of [place];Living will -  Does patient want to make changes to medical advance directive? No - Patient declined - - -  Copy of [place] in Chart? No - copy requested - - No - copy requested    Tobacco Social History   Tobacco Use  Smoking Status Never Smoker  Smokeless Tobacco Never Used     Counseling given: Not Answered    Past Medical History:  Diagnosis Date  . Allergy    Eggplant/KIWI  . Aortal stenosis 05/14/2010  . Arthritis   . Cancer ([place]) 11/2015   skin cancer forehead; MOHS procedure 11/2015  . GERD 12/06/2006  . Heart murmur    aortic valve  .  HYPERLIPIDEMIA 12/06/2006  . Hyperplastic colon polyp   . HYPERTENSION 12/06/2006  . MITRAL VALVE PROLAPSE 12/06/2006  . Osteopenia   . Spinal stenosis    Past Surgical History:  Procedure Laterality Date  . BREAST CYST ASPIRATION    . TUBAL LIGATION     Family History  Problem Relation Age of Onset  . Colon cancer Father 74  . Atrial fibrillation Father   . Diabetes Mother   . Heart failure Mother   . Heart disease Mother 79       CABG, quit 20 years prior o bypass  . Diabetes Maternal Grandmother   . Arthritis Sister   . Rheum arthritis Paternal Grandmother   . Lung disease Paternal Grandfather    Social History   Socioeconomic History  . Marital status: Married    Spouse name: Not on file  . Number of children: Not on file  . Years of education: Not on file  . Highest education level: Not on file  Occupational History  . Not on file  Social Needs  . Financial resource strain: Not on file  . Food insecurity:    Worry: Not on file    Inability: Not on file  . Transportation needs:    Medical: Not on file    Non-medical: Not on file  Tobacco Use  . Smoking status: Never Smoker  .  Smokeless tobacco: Never Used  Substance and Sexual Activity  . Alcohol use: Yes    Comment: rarely may have 1 wine a month or less  . Drug use: No  . Sexual activity: Not on file  Lifestyle  . Physical activity:    Days per week: Not on file    Minutes per session: Not on file  . Stress: Not on file  Relationships  . Social connections:    Talks on phone: Not on file    Gets together: Not on file    Attends religious service: Not on file    Active member of club or organization: Not on file    Attends meetings of clubs or organizations: Not on file    Relationship status: Not on file  Other Topics Concern  . Not on file  Social History Narrative   Married 1967. 1 son, lives in [place]. 1 grandson in [place] age 67.       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       Hobbies: family time, walking 2.5-3 miles daily, moviews     Outpatient Encounter Medications as of 01/03/2018  Medication Sig  . Calcium Carbonate-Vitamin D (CALTRATE 600+D) 600-400 MG-UNIT per tablet Take 1 tablet by mouth daily.    . Estradiol (ESTRACE VA) Place vaginally once a week.  . fexofenadine (ALLEGRA) 180 MG tablet Take 180 mg by mouth daily as needed.   . hydroxychloroquine (PLAQUENIL) 200 MG tablet Take 200 mg by mouth daily.   Marland Kitchen latanoprost (XALATAN) 0.005 % ophthalmic solution Place 1 drop into both eyes at bedtime.  Marland Kitchen lisinopril (PRINIVIL,ZESTRIL) 10 MG tablet TAKE 1 TABLET EVERY DAY  . Magnesium 250 MG TABS Take 1 tablet by mouth 2 (two) times daily.    . Minoxidil 5 % FOAM Apply 1 application topically daily.  . Multiple Vitamin (MULTIVITAMIN) capsule Take 1 capsule by mouth daily.    . naproxen sodium (ALEVE) 220 MG tablet Take 220 mg by mouth daily as needed.  Marland Kitchen omeprazole (PRILOSEC) 40 MG capsule Take 40 mg by mouth daily.  Marland Kitchen tretinoin (RETIN-A) 0.1 % cream Apply topically at bedtime.    . [DISCONTINUED] finasteride (PROSCAR) 5 MG tablet Take 5 mg by mouth daily.   No facility-administered encounter medications on file as of 01/03/2018.     Activities of Daily Living In your present state of health, do you have any difficulty performing the following activities: 01/03/2018  Hearing? N  Vision? N  Difficulty concentrating or making decisions? N  Walking or climbing stairs? N  Dressing or bathing? N  Doing errands, shopping? N  Preparing Food and eating ? N  Using the Toilet? N  In the past six months, have you accidently leaked urine? N  Do you have problems with loss of bowel control? N  Managing your Medications? N  Managing your Finances? N  Housekeeping or managing your Housekeeping? N  Some recent data might be hidden    Patient Care Team: [place] Olp, MD as PCP - General (Family Medicine)    Assessment:   This is a routine  wellness examination for [name].  Exercise Activities and Dietary recommendations Current Exercise Habits: Home exercise routine, Type of exercise: walking(Walks 3 miles a day in the park), Time (Minutes): 40, Frequency (Times/Week): 7, Weekly Exercise (Minutes/Week): 280, Intensity: Mild, Exercise limited by: orthopedic condition(s)   Breakfast: strawberries, blueberries, yogurt and granola with sunflower and walnuts, smoothie with almond milk and fruit  Lunch: sandwich, tuna or egg salad, possibly soup, drinks unsweetened ice tea  Dinner: [place]-b-q with collard greens Goals   None     Fall Risk Fall Risk  01/03/2018 06/01/2017 01/14/2016 09/17/2014 08/20/2013  Falls in the past year? No No No No No    Depression Screen PHQ 2/9 Scores 01/03/2018 06/01/2017 01/14/2016 09/17/2014  PHQ - 2 Score 0 0 0 0     Cognitive Function     6CIT Screen 01/03/2018  What Year? 0 points  What month? 0 points  What time? 0 points  Count back from 20 0 points  Months in reverse 0 points    Immunization History  Administered Date(s) Administered  . Influenza Split 02/08/2011, 02/08/2012  . Influenza Whole 03/02/2007, 02/20/2008  . Influenza, High Dose Seasonal PF 01/14/2016, 02/02/2017  . Influenza,inj,Quad PF,6+ Mos 02/09/2013, 01/18/2014, 02/06/2015  . Influenza-Unspecified 02/27/2004  . Pneumococcal Conjugate-13 06/10/2014  . Pneumococcal Polysaccharide-23 11/17/2007, 02/08/2011  . Td 05/10/2001  . Tdap 04/18/2013  . Zoster 12/20/2007      Screening Tests Health Maintenance  Topic Date Due  . INFLUENZA VACCINE  03/04/2018 (Originally 12/08/2017)  . MAMMOGRAM  11/02/2018  . COLONOSCOPY  03/01/2021  . TETANUS/TDAP  04/19/2023  . DEXA SCAN  Completed  . Hepatitis C Screening  Completed  . PNA vac Low Risk Adult  Completed        Plan:    Follow Up with PCP as advised   I have personally reviewed and noted the following in the patient's chart:   . Medical and social  history . Use of alcohol, tobacco or illicit drugs  . Current medications and supplements . Functional ability and status . Nutritional status . Physical activity . Advanced directives . List of other physicians . Vitals . Screenings to include cognitive, depression, and falls . Referrals and appointments  In addition, I have reviewed and discussed with patient certain preventive protocols, quality metrics, and best practice recommendations. A written personalized care plan for preventive services as well as general preventive health recommendations were provided to patient.     [place], [place]  [id number]

## 2018-01-03 NOTE — Progress Notes (Signed)
Subjective:  [place] is a 72 y.o. year old very pleasant female patient who presents for/with See problem oriented charting ROS- no extremity weakness. No chest pain or shortness of breath. No fever or chills. No rash over upper back.    Past Medical History-  Patient Active Problem List   Diagnosis Date Noted  . Spinal stenosis of lumbar region 01/14/2016    Priority: Medium  . Glaucoma 04/16/2015    Priority: Medium  . Suprapubic discomfort 09/17/2014    Priority: Medium  . Erosive osteoarthritis of both hands 08/10/2011    Priority: Medium  . Mild aortic regurgitation 05/14/2010    Priority: Medium  . Hyperlipidemia 12/06/2006    Priority: Medium  . Hypertension 12/06/2006    Priority: Medium  . Palpitations 04/16/2015    Priority: Low  . Allergic rhinitis 06/10/2014    Priority: Low  . Insomnia 06/10/2014    Priority: Low  . History of adenomatous polyp of colon 12/21/2006    Priority: Low  . Primary open-angle glaucoma 08/30/2016    Medications- reviewed and updated Current Outpatient Medications  Medication Sig Dispense Refill  . Calcium Carbonate-Vitamin D (CALTRATE 600+D) 600-400 MG-UNIT per tablet Take 1 tablet by mouth daily.      . Estradiol (ESTRACE VA) Place vaginally once a week.    . fexofenadine (ALLEGRA) 180 MG tablet Take 180 mg by mouth daily as needed.     . hydroxychloroquine (PLAQUENIL) 200 MG tablet Take 200 mg by mouth daily.     Marland Kitchen latanoprost (XALATAN) 0.005 % ophthalmic solution Place 1 drop into both eyes at bedtime.    Marland Kitchen lisinopril (PRINIVIL,ZESTRIL) 10 MG tablet TAKE 1 TABLET EVERY DAY 90 tablet 2  . Magnesium 250 MG TABS Take 1 tablet by mouth 2 (two) times daily.      . Minoxidil 5 % FOAM Apply 1 application topically daily.    . Multiple Vitamin (MULTIVITAMIN) capsule Take 1 capsule by mouth daily.      . naproxen sodium (ALEVE) 220 MG tablet Take 220 mg by mouth daily as needed.    Marland Kitchen omeprazole (PRILOSEC) 40 MG capsule Take 40 mg  by mouth daily.    Marland Kitchen tretinoin (RETIN-A) 0.1 % cream Apply topically at bedtime.       No current facility-administered medications for this visit.     Objective: BP 116/76 (BP Location: Left Arm, Patient Position: Sitting, Cuff Size: Normal)   Pulse 76   Temp 98.3 F (36.8 C) (Oral)   Ht 5\' 4"  (1.626 m)   Wt 151 lb (68.5 kg)   SpO2 95%   BMI 25.92 kg/m  Gen: NAD, resting comfortably CV: RRR no murmurs rubs or gallops Lungs: CTAB no crackles, wheeze, rhonchi Abdomen: soft/nontender/normal weight  Ext: no edema Skin: warm, dry Neuro: speech normal, moves all extremities and no obvious weakness Msk: some midline back pain in upper thoracic spine, more pain with palpation of paraspinous muscles  Assessment/Plan:  Other notes: 1. Annual appointment with Dr. Philis Pique on Friday- she is going to ask about updating bone density- she thought she had one last year but our records seem to show 08/2015.   Hypertension S: controlled on lisinopril 10mg  BP Readings from Last 3 Encounters:  01/03/18 116/76  01/03/18 116/76  10/11/17 138/82  A/P: We discussed blood pressure goal of <140/90. Continue current meds  Acute midline thoracic back pain - Plan: DG Thoracic Spine W/Swimmers S: 1.plan was low back surgery last visit. She didn't  have surgery. Did hot tub daily for 2 weeks and saw some improvement. Pain management person at [facility] put her on curcumin and she thinks that has helped. Leg pain on right and tailbone much improved. Still planning on surgery but see #2. Will need another MRI 2. She feels perhaps she has overextended herself getting ready to move out of their home (sold at full price!) - has some upper back pain at this point . Pain was 10/10 last week now -worse in Paraspinous muscles. Now 2/10. 2 weeks total of pain. A/P: given some midline pain and her history of compression fracture and osteopenia- will get thoracic films. If she has compression fracture- would call this  osteoporosis.   Hyperlipidemia S: poorly controlled on no rx but has had memory loss on statin A/P: patient had hoped to lose a few lbs but has remained stable- has been very busy in prepping house for sale- she agreed to try to restart her efforts   Future Appointments  Date Time Provider [place]  01/06/2018  1:20 PM [facility] MM 2 [facility]  01/10/2019  3:00 PM [facility] HEALTH COACH [facility]   Lab/Order associations: Acute midline thoracic back pain - Plan: DG Thoracic Spine W/Swimmers  Essential hypertension  Hyperlipidemia, unspecified hyperlipidemia type  Return precautions advised.  Garret Reddish, MD

## 2018-01-03 NOTE — Progress Notes (Signed)
I have reviewed and agree with note, evaluation, plan. See my separate note today   Garret Reddish, MD

## 2018-01-03 NOTE — Patient Instructions (Addendum)
Please stop by x-ray before you go  No changes today- blood pressure looks good

## 2018-01-03 NOTE — Progress Notes (Signed)
PCP notes: Last OV with PCP 10/10/2017   Health maintenance: Schedule Flu Shot this Fall   Abnormal screenings: None   Patient concerns: Has a new back pain. Midline Upper back. Started within the last 2 weeks. Seeing Dr. Yong Channel next    Nurse concerns: None   Next PCP appt: 01/03/18

## 2018-01-03 NOTE — Assessment & Plan Note (Signed)
S: poorly controlled on no rx but has had memory loss on statin A/P: patient had hoped to lose a few lbs but has remained stable- has been very busy in prepping house for sale- she agreed to try to restart her efforts

## 2018-01-06 ENCOUNTER — Ambulatory Visit
Admission: RE | Admit: 2018-01-06 | Discharge: 2018-01-06 | Disposition: A | Discharge status: Home / Self Care | Payer: Medicare Other | Source: Ambulatory Visit | Attending: Obstetrics and Gynecology | Admitting: Obstetrics and Gynecology

## 2018-01-06 ENCOUNTER — Other Ambulatory Visit: Payer: Self-pay | Admitting: Obstetrics and Gynecology

## 2018-01-06 DIAGNOSIS — Z1231 Encounter for screening mammogram for malignant neoplasm of breast: Secondary | ICD-10-CM

## 2018-01-06 DIAGNOSIS — Z01419 Encounter for gynecological examination (general) (routine) without abnormal findings: Secondary | ICD-10-CM | POA: Diagnosis not present

## 2018-01-06 DIAGNOSIS — E2839 Other primary ovarian failure: Secondary | ICD-10-CM

## 2018-01-13 ENCOUNTER — Ambulatory Visit (INDEPENDENT_AMBULATORY_CARE_PROVIDER_SITE_OTHER): Payer: Medicare Other

## 2018-01-13 DIAGNOSIS — Z23 Encounter for immunization: Secondary | ICD-10-CM

## 2018-01-13 NOTE — Progress Notes (Signed)
Patient in today for Flu shot. Administered in left arm. VIS given. Tolerated well

## 2018-01-23 DIAGNOSIS — M1990 Unspecified osteoarthritis, unspecified site: Secondary | ICD-10-CM | POA: Diagnosis not present

## 2018-01-24 DIAGNOSIS — Z79899 Other long term (current) drug therapy: Secondary | ICD-10-CM | POA: Diagnosis not present

## 2018-02-08 ENCOUNTER — Telehealth: Payer: Self-pay | Admitting: *Deleted

## 2018-02-08 DIAGNOSIS — M154 Erosive (osteo)arthritis: Secondary | ICD-10-CM

## 2018-02-08 NOTE — Telephone Encounter (Signed)
Copied from [place] [id number]. Topic: Referral - Request >> Feb 08, 2018  3:41 PM Antonieta Iba C wrote: Reason for CRM: pt is requesting a referral to [facility] for the arthritis in her hand. Pt says that she is considering having surgery and would like to consult with them

## 2018-02-09 NOTE — Telephone Encounter (Signed)
Fine to refer her. Just verify where she wants to go- I think she means [facility].

## 2018-02-10 NOTE — Telephone Encounter (Signed)
Called and spoke with patient. She requested to see Dr Amedeo Plenty specifically. Referral placed.

## 2018-02-10 NOTE — Addendum Note (Signed)
Addended by: Williemae Area on: 02/10/2018 12:19 PM   Modules accepted: Orders

## 2018-02-13 ENCOUNTER — Ambulatory Visit (INDEPENDENT_AMBULATORY_CARE_PROVIDER_SITE_OTHER): Payer: Medicare Other | Admitting: Family Medicine

## 2018-02-13 ENCOUNTER — Encounter: Payer: Self-pay | Admitting: Family Medicine

## 2018-02-13 VITALS — BP 142/84 | HR 68 | Temp 97.8°F | Ht 64.0 in | Wt 152.6 lb

## 2018-02-13 DIAGNOSIS — M25511 Pain in right shoulder: Secondary | ICD-10-CM

## 2018-02-13 NOTE — Progress Notes (Signed)
Subjective:  [place] is a 72 y.o. year old very pleasant female patient who presents for/with See problem oriented charting ROS- no chest pain or shortness of breath. Left shoulder pain noted. No numbness/tingling into hand reported.   Past Medical History-  Patient Active Problem List   Diagnosis Date Noted  . Spinal stenosis of lumbar region 01/14/2016    Priority: Medium  . Glaucoma 04/16/2015    Priority: Medium  . Suprapubic discomfort 09/17/2014    Priority: Medium  . Erosive osteoarthritis of both hands 08/10/2011    Priority: Medium  . Mild aortic regurgitation 05/14/2010    Priority: Medium  . Hyperlipidemia 12/06/2006    Priority: Medium  . Hypertension 12/06/2006    Priority: Medium  . Palpitations 04/16/2015    Priority: Low  . Allergic rhinitis 06/10/2014    Priority: Low  . Insomnia 06/10/2014    Priority: Low  . History of adenomatous polyp of colon 12/21/2006    Priority: Low  . Primary open-angle glaucoma 08/30/2016    Medications- reviewed and updated Current Outpatient Medications  Medication Sig Dispense Refill  . Calcium Carbonate-Vitamin D (CALTRATE 600+D) 600-400 MG-UNIT per tablet Take 1 tablet by mouth daily.      . Estradiol (ESTRACE VA) Place vaginally once a week.    . fexofenadine (ALLEGRA) 180 MG tablet Take 180 mg by mouth daily as needed.     . hydroxychloroquine (PLAQUENIL) 200 MG tablet Take 200 mg by mouth daily.     Marland Kitchen latanoprost (XALATAN) 0.005 % ophthalmic solution Place 1 drop into both eyes at bedtime.    Marland Kitchen lisinopril (PRINIVIL,ZESTRIL) 10 MG tablet TAKE 1 TABLET EVERY DAY 90 tablet 2  . Magnesium 250 MG TABS Take 1 tablet by mouth 2 (two) times daily.      . Minoxidil 5 % FOAM Apply 1 application topically daily.    . Multiple Vitamin (MULTIVITAMIN) capsule Take 1 capsule by mouth daily.      . naproxen sodium (ALEVE) 220 MG tablet Take 220 mg by mouth daily as needed.    . tretinoin (RETIN-A) 0.1 % cream Apply topically at  bedtime.      Marland Kitchen omeprazole (PRILOSEC) 40 MG capsule Take 40 mg by mouth daily.    . predniSONE (DELTASONE) 2.5 MG tablet Take 2.5 mg by mouth daily.  2   No current facility-administered medications for this visit.     Objective: BP (!) 142/84 (BP Location: Right Arm, Patient Position: Sitting, Cuff Size: Large)   Pulse 68   Temp 97.8 F (36.6 C) (Oral)   Ht 5\' 4"  (1.626 m)   Wt 152 lb 9.6 oz (69.2 kg)   SpO2 98%   BMI 26.19 kg/m  Gen: NAD, resting comfortably CV: RRR no murmurs rubs or gallops Lungs: CTAB no crackles, wheeze, rhonchi Abdomen: soft/nontender/nondistended Ext: no edema Skin: warm, dry, no rash over shoulder MSK: patient only able to move about 10 degrees with abduction and forward flexion before grasping arm in pain. Very painful anterior shoulder exam.   Assessment/Plan:  Acute pain of right shoulder - Plan: Ambulatory referral to Orthopedic Surgery S: Saturday afternoon started with pain in left shoulder down into elbow area. Pain was with palpation over the shoulder. Had an old shoulder sling she put her arm into. Felt like she had very little motion on the shoulder. Had 30 years ago- took 6 weeks to get it moving again. Pain in the sling not moving is about 3-4/10. When first  started was over 10/10 per her (she states 6). No chest pain or shortness of breath. No exertional pain. Sits upright to try to help no pain on right shoulder or other joints other than hands right now. Aleve three times a day- advised 2x a day.   Has rheumatologist at [facility]- saw 2-3 weeks ago- saw about hands (was taken off low dose plaquenil but placed on prednisone 2.5mg ). She has not started the prednisone due to concern about her eyes- Tuesday goes to Dr. Valetta Close for eyes- does have mild glaucoma as well as catarct but now with new shoulder pain she is open to shoulder injections if needed.  A/P: Concern for frozen shoulder given history and minimal movement on exam. Considered x-ray today  but this will be obtained at orthopedics and didn't want to duplicate this exam. I told her she can wear sling for comfort for now but encouraged movement as able. Hoping to get her in this week with orthopedics ([facility]). If cant get her in soon to her orthopedics team- could have her see Dr. Paulla Fore of sports medicine (also an excellent option)  -BP noted up slightly- likely due to aleve but I think short term use and mild elevation is reasonable to just be monitored  Future Appointments  Date Time Provider [place]  02/16/2018  7:30 AM [facility] DX DEXA 1 [facility]  01/10/2019  3:00 PM [facility] HEALTH COACH [facility]   Lab/Order associations: Acute pain of right shoulder - Plan: Ambulatory referral to Orthopedic Surgery  Return precautions advised.  Garret Reddish, MD

## 2018-02-13 NOTE — Patient Instructions (Signed)
We will call you within two days about your referral to [facility]. If you do not hear within days, give [place] a call. Hoping to have you seen by end of the week.

## 2018-02-16 ENCOUNTER — Ambulatory Visit
Admission: RE | Admit: 2018-02-16 | Discharge: 2018-02-16 | Disposition: A | Discharge status: Home / Self Care | Payer: Medicare Other | Source: Ambulatory Visit | Attending: Obstetrics and Gynecology | Admitting: Obstetrics and Gynecology

## 2018-02-16 DIAGNOSIS — M8589 Other specified disorders of bone density and structure, multiple sites: Secondary | ICD-10-CM | POA: Diagnosis not present

## 2018-02-16 DIAGNOSIS — Z79899 Other long term (current) drug therapy: Secondary | ICD-10-CM | POA: Diagnosis not present

## 2018-02-16 DIAGNOSIS — E2839 Other primary ovarian failure: Secondary | ICD-10-CM

## 2018-02-16 DIAGNOSIS — Z780 Asymptomatic menopausal state: Secondary | ICD-10-CM | POA: Diagnosis not present

## 2018-02-17 DIAGNOSIS — M25512 Pain in left shoulder: Secondary | ICD-10-CM | POA: Diagnosis not present

## 2018-02-21 DIAGNOSIS — L601 Onycholysis: Secondary | ICD-10-CM | POA: Diagnosis not present

## 2018-02-21 DIAGNOSIS — D1801 Hemangioma of skin and subcutaneous tissue: Secondary | ICD-10-CM | POA: Diagnosis not present

## 2018-02-21 DIAGNOSIS — I781 Nevus, non-neoplastic: Secondary | ICD-10-CM | POA: Diagnosis not present

## 2018-02-21 DIAGNOSIS — L821 Other seborrheic keratosis: Secondary | ICD-10-CM | POA: Diagnosis not present

## 2018-02-22 ENCOUNTER — Telehealth: Payer: Self-pay | Admitting: Family Medicine

## 2018-02-22 NOTE — Telephone Encounter (Signed)
Copied from [place] [id number]. Topic: Referral - Request for Referral >> Feb 08, 2018  3:41 PM Antonieta Iba C wrote: Reason for CRM: pt is requesting a referral to [facility] for the arthritis in her hand. Pt says that she is considering having surgery and would like to consult with them >> Feb 13, 2018  9:57 AM Vernona Rieger wrote: [place], Patient called and said to please give [facility] her cell number. I have updated her chart and took out the land line ( no longer working ) [phone] >> Feb 13, 2018  4:44 PM Bella Kennedy C wrote: Please call patient and give her [facility]'s contact information.

## 2018-02-23 DIAGNOSIS — H401132 Primary open-angle glaucoma, bilateral, moderate stage: Secondary | ICD-10-CM | POA: Diagnosis not present

## 2018-03-03 ENCOUNTER — Other Ambulatory Visit: Payer: Medicare Other

## 2018-03-27 DIAGNOSIS — H401132 Primary open-angle glaucoma, bilateral, moderate stage: Secondary | ICD-10-CM | POA: Diagnosis not present

## 2018-04-10 DIAGNOSIS — H401132 Primary open-angle glaucoma, bilateral, moderate stage: Secondary | ICD-10-CM | POA: Diagnosis not present

## 2018-04-17 DIAGNOSIS — M79641 Pain in right hand: Secondary | ICD-10-CM | POA: Diagnosis not present

## 2018-04-17 DIAGNOSIS — M79642 Pain in left hand: Secondary | ICD-10-CM | POA: Diagnosis not present

## 2018-04-26 DIAGNOSIS — Z7952 Long term (current) use of systemic steroids: Secondary | ICD-10-CM | POA: Diagnosis not present

## 2018-04-26 DIAGNOSIS — M154 Erosive (osteo)arthritis: Secondary | ICD-10-CM | POA: Diagnosis not present

## 2018-04-26 DIAGNOSIS — M1990 Unspecified osteoarthritis, unspecified site: Secondary | ICD-10-CM | POA: Diagnosis not present

## 2018-04-26 DIAGNOSIS — Z79899 Other long term (current) drug therapy: Secondary | ICD-10-CM | POA: Diagnosis not present

## 2018-05-08 DIAGNOSIS — H401132 Primary open-angle glaucoma, bilateral, moderate stage: Secondary | ICD-10-CM | POA: Diagnosis not present

## 2018-05-10 HISTORY — PX: CATARACT EXTRACTION: SUR2

## 2018-06-07 DIAGNOSIS — M154 Erosive (osteo)arthritis: Secondary | ICD-10-CM | POA: Diagnosis not present

## 2018-06-07 DIAGNOSIS — M79645 Pain in left finger(s): Secondary | ICD-10-CM | POA: Diagnosis not present

## 2018-06-07 DIAGNOSIS — M79644 Pain in right finger(s): Secondary | ICD-10-CM | POA: Diagnosis not present

## 2018-06-20 DIAGNOSIS — H401132 Primary open-angle glaucoma, bilateral, moderate stage: Secondary | ICD-10-CM | POA: Diagnosis not present

## 2018-07-26 ENCOUNTER — Other Ambulatory Visit: Payer: Self-pay | Admitting: Family Medicine

## 2018-08-11 ENCOUNTER — Other Ambulatory Visit: Payer: Self-pay

## 2018-08-11 ENCOUNTER — Encounter: Payer: Self-pay | Admitting: Family Medicine

## 2018-08-11 ENCOUNTER — Ambulatory Visit (INDEPENDENT_AMBULATORY_CARE_PROVIDER_SITE_OTHER): Payer: Medicare Other | Admitting: Family Medicine

## 2018-08-11 VITALS — BP 136/81 | HR 73 | Temp 97.7°F | Ht 64.0 in | Wt 156.6 lb

## 2018-08-11 DIAGNOSIS — H9191 Unspecified hearing loss, right ear: Secondary | ICD-10-CM | POA: Diagnosis not present

## 2018-08-11 DIAGNOSIS — H938X3 Other specified disorders of ear, bilateral: Secondary | ICD-10-CM

## 2018-08-11 DIAGNOSIS — J301 Allergic rhinitis due to pollen: Secondary | ICD-10-CM | POA: Diagnosis not present

## 2018-08-11 DIAGNOSIS — H409 Unspecified glaucoma: Secondary | ICD-10-CM | POA: Diagnosis not present

## 2018-08-11 DIAGNOSIS — H6591 Unspecified nonsuppurative otitis media, right ear: Secondary | ICD-10-CM | POA: Diagnosis not present

## 2018-08-11 DIAGNOSIS — H6121 Impacted cerumen, right ear: Secondary | ICD-10-CM

## 2018-08-11 MED ORDER — FLUTICASONE PROPIONATE 50 MCG/ACT NA SUSP: 2.0000 | Freq: Every day | NASAL | 0 refills | Status: DC

## 2018-08-11 NOTE — Patient Instructions (Addendum)
We cleaned out the wax that is in your ear  Beyond that-I am still concerned there may be some fluid in the inner ear  I want you to start Flonase for 2 to 3 weeks if it is okay with your eye doctor-I am okay with you waiting until Monday and to call them.  Tell them I want you to try to take this for otitis media with effusion that may be caused by allergies.  If you are not improving within 3 weeks-we may need to consider referral to ENT for their opinion/possible ear tubes

## 2018-08-11 NOTE — Progress Notes (Signed)
Phone [phone]   Subjective:  Destiny Acosta is a 73 y.o. year old very pleasant female patient who presents for/with See problem oriented charting ROS-no fever, chills, cough.  Complains of ear fullness and difficulty hearing-particularly on the right ear.  Complains of some cracking/popping in the ears-particularly in the right ear  Past Medical History-  Patient Active Problem List   Diagnosis Date Noted  . Spinal stenosis of lumbar region 01/14/2016    Priority: Medium  . Glaucoma 04/16/2015    Priority: Medium  . Suprapubic discomfort 09/17/2014    Priority: Medium  . Erosive osteoarthritis of both hands 08/10/2011    Priority: Medium  . Mild aortic regurgitation 05/14/2010    Priority: Medium  . Hyperlipidemia 12/06/2006    Priority: Medium  . Hypertension 12/06/2006    Priority: Medium  . Palpitations 04/16/2015    Priority: Low  . Allergic rhinitis 06/10/2014    Priority: Low  . Insomnia 06/10/2014    Priority: Low  . History of adenomatous polyp of colon 12/21/2006    Priority: Low  . Primary open-angle glaucoma 08/30/2016    Medications- reviewed and updated Current Outpatient Medications  Medication Sig Dispense Refill  . Calcium Carbonate-Vitamin D (CALTRATE 600+D) 600-400 MG-UNIT per tablet Take 1 tablet by mouth daily.      . Estradiol (ESTRACE VA) Place vaginally once a week.    . fexofenadine (ALLEGRA) 180 MG tablet Take 180 mg by mouth daily as needed.     . hydroxychloroquine (PLAQUENIL) 200 MG tablet Take 200 mg by mouth daily.     Marland Kitchen latanoprost (XALATAN) 0.005 % ophthalmic solution Place 1 drop into both eyes at bedtime.    Marland Kitchen lisinopril (PRINIVIL,ZESTRIL) 10 MG tablet TAKE 1 TABLET BY MOUTH EVERY DAY 90 tablet 2  . Magnesium 250 MG TABS Take 1 tablet by mouth 2 (two) times daily.      . Minoxidil 5 % FOAM Apply 1 application topically daily.    . Multiple Vitamin (MULTIVITAMIN) capsule Take 1 capsule by mouth daily.      . naproxen sodium  (ALEVE) 220 MG tablet Take 220 mg by mouth daily as needed.    . predniSONE (DELTASONE) 2.5 MG tablet Take 2.5 mg by mouth daily.  2  . tretinoin (RETIN-A) 0.1 % cream Apply topically at bedtime.      . fluticasone (FLONASE) 50 MCG/ACT nasal spray Place 2 sprays into both nostrils daily. 16 g 0  . omeprazole (PRILOSEC) 40 MG capsule Take 40 mg by mouth daily.     No current facility-administered medications for this visit.      Objective:  BP 136/81   Pulse 73   Temp 97.7 F (36.5 C) (Oral)   Ht 5\' 4"  (1.626 m)   Wt 156 lb 9.6 oz (71 kg)   SpO2 97%   BMI 26.88 kg/m  Gen: NAD, resting comfortably Tympanic membrane normal on the left.  The right ear canal was completely obstructed by several sheets of cerumen requiring curetting-after curetting- tympanic membrane with slight bulge and slightly amber-colored discoloration-possible fluid CV: RRR  Lungs: nonlabored, normal respiratory rate Abdomen: soft/nondistended Ext: no edema Skin: warm, dry Neuro: Mild difficulty hearing   Procedure note: Verbal consent obtained- discussed risk of tympanic membrane perforation Obstructive Cerumen fully obscuring tympanic membrane which was essential for evaluation noted in right ear. Patient reports itching sensation related to the wax.  Multiple rounds of curretting required to dislodge cerumen fully.  Full view of tympanic  membrane after procedure.  Patient tolerated procedure well Considered irrigation but patient with history of dizziness - and patient was concerned about irrigation- provider skill was required as a result- above procedure completed by me- Garret Reddish, MD    Assessment and Plan   Right otitis media with effusion  Impacted cerumen of right ear  Hearing loss of right ear, unspecified hearing loss type  Sensation of fullness in both ears  Seasonal allergic rhinitis due to pollen  Glaucoma, unspecified glaucoma type, unspecified laterality S: Patient reports 3 to  4 weeks of right ear fullness.  This is been extremely irritating for patient and is made it difficult to sleep at times.  She is also noted people are having to repeat themselves/trouble hearing.  She may have periods where it improves and then she notes that again.  Having itching in the ear canal as well. A/P: Initially patient had relief of itching/fullness in the ear with cerumen removal per procedure above-so I thought her symptoms may have been related to cerumen impaction.  After procedure I had patient auto insufflate-she noted recurrence of fullness and hearing issues but no itching was reported.  Tympanic membranes once in view had amber discoloration with slight bulge- concerning for otitis media with effusion.  Patient has a baseline history of allergies and is on Allegra.  We opted to add Flonase to her regimen but I did want her to ask her ophthalmologist due to her history of glaucoma whether this is okay to use.  My plan would be only to use for 2 to 3 weeks.  If this does not resolve we discussed referring to ENT for further evaluation and consideration of tympanostomy tube placement if they concur with OME diagnosis. -She agrees with trial of this as she would like to stay out of another doctor's office if at all possible  Future Appointments  Date Time Provider Vander  01/10/2019  3:00 PM [facility]   Lab/Order associations: Right otitis media with effusion  Impacted cerumen of right ear  Hearing loss of right ear, unspecified hearing loss type  Sensation of fullness in both ears  Seasonal allergic rhinitis due to pollen  Glaucoma, unspecified glaucoma type, unspecified laterality  Meds ordered this encounter  Medications  . fluticasone (FLONASE) 50 MCG/ACT nasal spray    Sig: Place 2 sprays into both nostrils daily.    Dispense:  16 g    Refill:  0    Return precautions advised.  Garret Reddish, MD

## 2018-09-13 ENCOUNTER — Encounter: Payer: Self-pay | Admitting: Physician Assistant

## 2018-09-13 ENCOUNTER — Other Ambulatory Visit: Payer: Self-pay | Admitting: *Deleted

## 2018-09-13 ENCOUNTER — Ambulatory Visit (INDEPENDENT_AMBULATORY_CARE_PROVIDER_SITE_OTHER): Payer: Medicare Other | Admitting: Physician Assistant

## 2018-09-13 DIAGNOSIS — R300 Dysuria: Secondary | ICD-10-CM | POA: Diagnosis not present

## 2018-09-13 LAB — POCT URINALYSIS DIPSTICK
Bilirubin, UA: NEGATIVE
Blood, UA: NEGATIVE
Glucose, UA: NEGATIVE
Ketones, UA: NEGATIVE
Leukocytes, UA: NEGATIVE
Nitrite, UA: NEGATIVE
Protein, UA: NEGATIVE
Spec Grav, UA: 1.01 (ref 1.010–1.025)
Urobilinogen, UA: 0.2 E.U./dL
pH, UA: 7.5 (ref 5.0–8.0)

## 2018-09-13 MED ORDER — CEPHALEXIN 500 MG PO CAPS: 500.0000 mg | ORAL_CAPSULE | Freq: Three times a day (TID) | ORAL | 0 refills | Status: AC

## 2018-09-13 NOTE — Progress Notes (Signed)
Virtual Visit via Video   I connected with Destiny Acosta on 09/13/18 at 11:40 AM EDT by a video enabled telemedicine application and verified that I am speaking with the correct person using two identifiers. Location patient: Home Location provider: [facility], Office Persons participating in the virtual visit: [place], [place], [place], Anselmo Pickler, LPN  I discussed the limitations of evaluation and management by telemedicine and the availability of in person appointments. The patient expressed understanding and agreed to proceed.  I Anselmo Pickler, LPN, actd as a scribe for [employer], PA-C.  Subjective:   HPI: Urinary Frequency Pt started having symptoms Saturday night,c/o pelvic pressure and urinary frequency. Pt has hx of UTI's and does not want to let it get bad where she is passing blood. Pt would like antibiotic to have on hand. Pt took Azo Sunday and Monday to help symptoms with some relief. Pt has not taken Azo today. Denies back pain, nausea or hematuria.   She thinks that she is having these symptoms possibly because she is sitting more often now and drinking less water.   ROS: See pertinent positives and negatives per HPI.  Patient Active Problem List   Diagnosis Date Noted  . Primary open-angle glaucoma 08/30/2016  . Spinal stenosis of lumbar region 01/14/2016  . Glaucoma 04/16/2015  . Palpitations 04/16/2015  . Suprapubic discomfort 09/17/2014  . Allergic rhinitis 06/10/2014  . Insomnia 06/10/2014  . Erosive osteoarthritis of both hands 08/10/2011  . Mild aortic regurgitation 05/14/2010  . History of adenomatous polyp of colon 12/21/2006  . Hyperlipidemia 12/06/2006  . Hypertension 12/06/2006    Social History   Tobacco Use  . Smoking status: Never Smoker  . Smokeless tobacco: Never Used  Substance Use Topics  . Alcohol use: Yes    Comment: rarely may have 1 wine a month or less    Current Outpatient Medications:  .  Calcium  Carbonate-Vitamin D (CALTRATE 600+D) 600-400 MG-UNIT per tablet, Take 1 tablet by mouth daily.  , Disp: , Rfl:  .  dorzolamide-timolol (COSOPT) 22.3-6.8 MG/ML ophthalmic solution, INTILL 1 DROP INTO BOTH EYES TWICE A DAY, Disp: , Rfl:  .  Estradiol (ESTRACE VA), Place vaginally once a week., Disp: , Rfl:  .  fexofenadine (ALLEGRA) 180 MG tablet, Take 180 mg by mouth daily as needed. , Disp: , Rfl:  .  fluticasone (FLONASE) 50 MCG/ACT nasal spray, Place 2 sprays into both nostrils daily., Disp: 16 g, Rfl: 0 .  latanoprost (XALATAN) 0.005 % ophthalmic solution, Place 1 drop into both eyes at bedtime., Disp: , Rfl:  .  lisinopril (PRINIVIL,ZESTRIL) 10 MG tablet, TAKE 1 TABLET BY MOUTH EVERY DAY, Disp: 90 tablet, Rfl: 2 .  Minoxidil 5 % FOAM, Apply 1 application topically daily., Disp: , Rfl:  .  Multiple Vitamin (MULTIVITAMIN) capsule, Take 1 capsule by mouth daily.  , Disp: , Rfl:  .  naproxen sodium (ALEVE) 220 MG tablet, Take 220 mg by mouth daily as needed., Disp: , Rfl:  .  tretinoin (RETIN-A) 0.1 % cream, Apply topically at bedtime.  , Disp: , Rfl:  .  cephALEXin (KEFLEX) 500 MG capsule, Take 1 capsule (500 mg total) by mouth 3 (three) times daily for 5 days., Disp: 15 capsule, Rfl: 0 .  omeprazole (PRILOSEC) 40 MG capsule, Take 40 mg by mouth daily., Disp: , Rfl:  .  predniSONE (DELTASONE) 2.5 MG tablet, Take 2.5 mg by mouth daily., Disp: , Rfl: 2  Allergies  Allergen Reactions  . Prednisolone Swelling  . Tizanidine Anaphylaxis  . Ciprofloxacin Other (See Comments)    Muscle pain  . Codeine Phosphate     REACTION: nausea, vomiting  . Kiwi Extract Nausea And Vomiting  . Latex Rash    Objective:   VITALS: Per patient if applicable, see vitals. GENERAL: Alert, appears well and in no acute distress. HEENT: Atraumatic, conjunctiva clear, no obvious abnormalities on inspection of external nose and ears. NECK: Normal movements of the head and neck. CARDIOPULMONARY: No increased WOB.  Speaking in clear sentences. I:E ratio WNL.  MS: Moves all visible extremities without noticeable abnormality. PSYCH: Pleasant and cooperative, well-groomed. Speech normal rate and rhythm. Affect is appropriate. Insight and judgement are appropriate. Attention is focused, linear, and appropriate.  NEURO: CN grossly intact. Oriented as arrived to appointment on time with no prompting. Moves both UE equally.  SKIN: No obvious lesions, wounds, erythema, or cyanosis noted on face or hands.  Assessment and Plan:   [name] was seen today for urinary frequency.  Diagnoses and all orders for this visit:  Dysuria No red flags. UA negative. Discussed keflex 500 mg TID for her symptoms, if they worsen vs starting now. She has opted to start the antibiotic if her symptoms worsen or if culture deems appropriate. ER precautions advised. -     POCT urinalysis dipstick; Future -     Urine Culture; Future -     Urine Culture -     POCT urinalysis dipstick  Other orders -     cephALEXin (KEFLEX) 500 MG capsule; Take 1 capsule (500 mg total) by mouth 3 (three) times daily for 5 days.  . Reviewed expectations re: course of current medical issues. . Discussed self-management of symptoms. . Outlined signs and symptoms indicating need for more acute intervention. . Patient verbalized understanding and all questions were answered. Marland Kitchen Health Maintenance issues including appropriate healthy diet, exercise, and smoking avoidance were discussed with patient. . See orders for this visit as documented in the electronic medical record.  I discussed the assessment and treatment plan with the patient. The patient was provided an opportunity to ask questions and all were answered. The patient agreed with the plan and demonstrated an understanding of the instructions.   The patient was advised to call back or seek an in-person evaluation if the symptoms worsen or if the condition fails to improve as anticipated.   CMA  or LPN served as scribe during this visit. History, Physical, and Plan performed by medical provider. The above documentation has been reviewed and is accurate and complete.  [place], [place] 09/13/2018

## 2018-09-14 LAB — URINE CULTURE
MICRO NUMBER:: 450242
Result:: NO GROWTH
SPECIMEN QUALITY:: ADEQUATE

## 2018-09-19 DIAGNOSIS — M154 Erosive (osteo)arthritis: Secondary | ICD-10-CM | POA: Diagnosis not present

## 2018-10-17 DIAGNOSIS — M154 Erosive (osteo)arthritis: Secondary | ICD-10-CM | POA: Diagnosis not present

## 2018-10-17 DIAGNOSIS — R21 Rash and other nonspecific skin eruption: Secondary | ICD-10-CM | POA: Diagnosis not present

## 2018-10-17 DIAGNOSIS — Z791 Long term (current) use of non-steroidal anti-inflammatories (NSAID): Secondary | ICD-10-CM | POA: Diagnosis not present

## 2018-11-14 DIAGNOSIS — H2512 Age-related nuclear cataract, left eye: Secondary | ICD-10-CM | POA: Diagnosis not present

## 2018-11-15 ENCOUNTER — Other Ambulatory Visit: Payer: Self-pay | Admitting: Obstetrics and Gynecology

## 2018-11-15 DIAGNOSIS — Z1231 Encounter for screening mammogram for malignant neoplasm of breast: Secondary | ICD-10-CM

## 2018-11-21 ENCOUNTER — Telehealth: Payer: Self-pay | Admitting: Family Medicine

## 2018-11-21 NOTE — Telephone Encounter (Signed)
I called pt to reschedule her AWV and the earliest appt I was able to find for her was 9:40am. She is unable to fast for her lab work this long and was wondering if Dr. Yong Channel could put the orders in for her in advance so she can come in Before her appt to have the lab work done. Please let me know if this will be OK and I will call patient back to get her scheduled. Thank you!

## 2018-11-21 NOTE — Telephone Encounter (Signed)
Get her scheduled for wellness visit and then I will place orders for previsit labs- send me message back after scheduled

## 2018-11-21 NOTE — Telephone Encounter (Signed)
Forwarding to Dr. Hunter.  

## 2018-11-23 NOTE — Telephone Encounter (Signed)
Please contact pt to schedule AWV with lab visit prior. Then forward encounter to Dr. Yong Channel so that he can place future lab orders. Thanks!

## 2018-12-27 ENCOUNTER — Ambulatory Visit (INDEPENDENT_AMBULATORY_CARE_PROVIDER_SITE_OTHER): Payer: Medicare Other

## 2018-12-27 ENCOUNTER — Encounter: Payer: Self-pay | Admitting: Family Medicine

## 2018-12-27 DIAGNOSIS — Z23 Encounter for immunization: Secondary | ICD-10-CM

## 2019-01-09 ENCOUNTER — Ambulatory Visit: Payer: Medicare Other

## 2019-01-10 ENCOUNTER — Ambulatory Visit: Payer: Medicare Other

## 2019-02-01 ENCOUNTER — Other Ambulatory Visit: Payer: Self-pay

## 2019-02-01 ENCOUNTER — Ambulatory Visit (INDEPENDENT_AMBULATORY_CARE_PROVIDER_SITE_OTHER): Payer: Medicare Other | Admitting: Family Medicine

## 2019-02-01 ENCOUNTER — Ambulatory Visit (INDEPENDENT_AMBULATORY_CARE_PROVIDER_SITE_OTHER): Payer: Medicare Other

## 2019-02-01 ENCOUNTER — Encounter: Payer: Self-pay | Admitting: Family Medicine

## 2019-02-01 VITALS — BP 118/80 | HR 78 | Temp 98.1°F | Ht 64.0 in | Wt 156.8 lb

## 2019-02-01 VITALS — BP 118/80 | Temp 98.1°F | Ht 64.0 in | Wt 156.7 lb

## 2019-02-01 DIAGNOSIS — K219 Gastro-esophageal reflux disease without esophagitis: Secondary | ICD-10-CM

## 2019-02-01 DIAGNOSIS — M19041 Primary osteoarthritis, right hand: Secondary | ICD-10-CM

## 2019-02-01 DIAGNOSIS — Z0000 Encounter for general adult medical examination without abnormal findings: Secondary | ICD-10-CM | POA: Diagnosis not present

## 2019-02-01 DIAGNOSIS — R109 Unspecified abdominal pain: Secondary | ICD-10-CM | POA: Diagnosis not present

## 2019-02-01 DIAGNOSIS — M19042 Primary osteoarthritis, left hand: Secondary | ICD-10-CM | POA: Diagnosis not present

## 2019-02-01 MED ORDER — OMEPRAZOLE 40 MG PO CPDR: 40.0000 mg | DELAYED_RELEASE_CAPSULE | Freq: Two times a day (BID) | ORAL | 0 refills | Status: DC

## 2019-02-01 NOTE — Assessment & Plan Note (Signed)
#   Heartburn/LUQ Pain S:She takes a lot of Aleve for her arthritis. Also stopped voltaren because she thought it was making reflux worse. She started OTC prilosec but rheumatologist increased to 40mg . She has d/ced  Aleve 1-2 months ago and started taking Tylenol instead. Felt hot burning in her throat when on aleve- but after taking prilosec and coming off of aleve symptoms have shifted more to epigastric area and does not come up in the throat as much.  Has had some off and on swallowing issues- feels like things get stuck/mildly hurt when going down. Overall has improved but just seems to be lingering.   She was taking prilosec 40mg  in AM for several months. Stopped taking on Tuesday/2 days ago and didn't worsen significantly in last few days. Sleeping upright in bed does seem to help some.    Denies blood in the stool. Has noticed some blood on toilet paper but only after passing a hard BM.   Is doing chocolate covered peanuts at night- discussed could contribute  A/P: heartburn while on good dose of prilosec- possible patient has developed gastritis/stomach inflammation- will try higher dose prilosec 40mg  twice a day for a month- if no improvement go to 1 tablet per day for 7 days then stop- alert [place] at that point and we will set up H. Pylori Breath test once off medicine 2 weeks. IF no H. Pylori would refer to GI for their opinion.   - resume 40mg  daily if symptoms resolve and then consider 20mg  at follow up

## 2019-02-01 NOTE — Patient Instructions (Addendum)
heartburn while on good dose of prilosec- possible patient has developed gastritis/stomach inflammation- will try higher dose prilosec 40mg  twice a day for a month- if no improvement go to 1 tablet per day for 7 days then stop- alert [place] at that point and we will set up H. Pylori Breath test once off medicine 2 weeks. IF no H. Pylori would refer to GI for their opinion.    Please continue heat on your side perhaps 1-2x a day for 15-20 minutes .please let us know if this worsens but strongly suspect this is a muscular strain and will continue to improve as long as you don't overdo it

## 2019-02-01 NOTE — Progress Notes (Signed)
I have reviewed and agree with note, evaluation, plan.   Esteen Delpriore, MD  

## 2019-02-01 NOTE — Patient Instructions (Signed)
Destiny Acosta , Thank you for taking time to come for your Medicare Wellness Visit. I appreciate your ongoing commitment to your health goals. Please review the following plan we discussed and let me know if I can assist you in the future.   Screening recommendations/referrals: Colorectal Screening: completed 03/01/16 Mammogram: up to date; last 01/06/18 Bone Density: up to date; last 02/16/18  Vision and Dental Exams: Recommended annual ophthalmology exams for early detection of glaucoma and other disorders of the eye Recommended annual dental exams for proper oral hygiene  Vaccinations: Influenza vaccine: completed 12/27/18 Pneumococcal vaccine: up to date; last 06/10/14 Tdap vaccine: up to date; last 04/18/13 Shingles vaccine: Please call your insurance company to determine your out of pocket expense for the Shingrix vaccine. You may receive this vaccine at your local pharmacy.  Advanced directives: Please bring a copy of your POA (Power of Attorney) and/or Living Will to your next appointment.  Goals: Work on diet changes to improve acid reflux symptoms  Next appointment: Please schedule your Annual Wellness Visit with your Nurse Health Advisor in one year.  Preventive Care 74 Years and Older, Female Preventive care refers to lifestyle choices and visits with your health care provider that can promote health and wellness. What does preventive care include?  A yearly physical exam. This is also called an annual well check.  Dental exams once or twice a year.  Routine eye exams. Ask your health care provider how often you should have your eyes checked.  Personal lifestyle choices, including:  Daily care of your teeth and gums.  Regular physical activity.  Eating a healthy diet.  Avoiding tobacco and drug use.  Limiting alcohol use.  Practicing safe sex.  Taking low-dose aspirin every day if recommended by your health care provider.  Taking vitamin and mineral supplements  as recommended by your health care provider. What happens during an annual well check? The services and screenings done by your health care provider during your annual well check will depend on your age, overall health, lifestyle risk factors, and family history of disease. Counseling  Your health care provider may ask you questions about your:  Alcohol use.  Tobacco use.  Drug use.  Emotional well-being.  Home and relationship well-being.  Sexual activity.  Eating habits.  History of falls.  Memory and ability to understand (cognition).  Work and work Statistician.  Reproductive health. Screening  You may have the following tests or measurements:  Height, weight, and BMI.  Blood pressure.  Lipid and cholesterol levels. These may be checked every 5 years, or more frequently if you are over 26 years old.  Skin check.  Lung cancer screening. You may have this screening every year starting at age 28 if you have a 30-pack-year history of smoking and currently smoke or have quit within the past 15 years.  Fecal occult blood test (FOBT) of the stool. You may have this test every year starting at age 11.  Flexible sigmoidoscopy or colonoscopy. You may have a sigmoidoscopy every 5 years or a colonoscopy every 10 years starting at age 46.  Hepatitis C blood test.  Hepatitis B blood test.  Sexually transmitted disease (STD) testing.  Diabetes screening. This is done by checking your blood sugar (glucose) after you have not eaten for a while (fasting). You may have this done every 1-3 years.  Bone density scan. This is done to screen for osteoporosis. You may have this done starting at age 67.  Mammogram. This may  be done every 1-2 years. Talk to your health care provider about how often you should have regular mammograms. Talk with your health care provider about your test results, treatment options, and if necessary, the need for more tests. Vaccines  Your health care  provider may recommend certain vaccines, such as:  Influenza vaccine. This is recommended every year.  Tetanus, diphtheria, and acellular pertussis (Tdap, Td) vaccine. You may need a Td booster every 10 years.  Zoster vaccine. You may need this after age 62.  Pneumococcal 13-valent conjugate (PCV13) vaccine. One dose is recommended after age 100.  Pneumococcal polysaccharide (PPSV23) vaccine. One dose is recommended after age 20. Talk to your health care provider about which screenings and vaccines you need and how often you need them. This information is not intended to replace advice given to you by your health care provider. Make sure you discuss any questions you have with your health care provider. Document Released: 05/23/2015 Document Revised: 01/14/2016 Document Reviewed: 02/25/2015 Elsevier Interactive Patient Education  2017 [employer] Prevention in the Home Falls can cause injuries. They can happen to people of all ages. There are many things you can do to make your home safe and to help prevent falls. What can I do on the outside of my home?  Regularly fix the edges of walkways and driveways and fix any cracks.  Remove anything that might make you trip as you walk through a door, such as a raised step or threshold.  Trim any bushes or trees on the path to your home.  Use bright outdoor lighting.  Clear any walking paths of anything that might make someone trip, such as rocks or tools.  Regularly check to see if handrails are loose or broken. Make sure that both sides of any steps have handrails.  Any raised decks and porches should have guardrails on the edges.  Have any leaves, snow, or ice cleared regularly.  Use sand or salt on walking paths during winter.  Clean up any spills in your garage right away. This includes oil or grease spills. What can I do in the bathroom?  Use night lights.  Install grab bars by the toilet and in the tub and shower. Do  not use towel bars as grab bars.  Use non-skid mats or decals in the tub or shower.  If you need to sit down in the shower, use a plastic, non-slip stool.  Keep the floor dry. Clean up any water that spills on the floor as soon as it happens.  Remove soap buildup in the tub or shower regularly.  Attach bath mats securely with double-sided non-slip rug tape.  Do not have throw rugs and other things on the floor that can make you trip. What can I do in the bedroom?  Use night lights.  Make sure that you have a light by your bed that is easy to reach.  Do not use any sheets or blankets that are too big for your bed. They should not hang down onto the floor.  Have a firm chair that has side arms. You can use this for support while you get dressed.  Do not have throw rugs and other things on the floor that can make you trip. What can I do in the kitchen?  Clean up any spills right away.  Avoid walking on wet floors.  Keep items that you use a lot in easy-to-reach places.  If you need to reach something above you,  use a strong step stool that has a grab bar.  Keep electrical cords out of the way.  Do not use floor polish or wax that makes floors slippery. If you must use wax, use non-skid floor wax.  Do not have throw rugs and other things on the floor that can make you trip. What can I do with my stairs?  Do not leave any items on the stairs.  Make sure that there are handrails on both sides of the stairs and use them. Fix handrails that are broken or loose. Make sure that handrails are as long as the stairways.  Check any carpeting to make sure that it is firmly attached to the stairs. Fix any carpet that is loose or worn.  Avoid having throw rugs at the top or bottom of the stairs. If you do have throw rugs, attach them to the floor with carpet tape.  Make sure that you have a light switch at the top of the stairs and the bottom of the stairs. If you do not have them,  ask someone to add them for you. What else can I do to help prevent falls?  Wear shoes that:  Do not have high heels.  Have rubber bottoms.  Are comfortable and fit you well.  Are closed at the toe. Do not wear sandals.  If you use a stepladder:  Make sure that it is fully opened. Do not climb a closed stepladder.  Make sure that both sides of the stepladder are locked into place.  Ask someone to hold it for you, if possible.  Clearly mark and make sure that you can see:  Any grab bars or handrails.  First and last steps.  Where the edge of each step is.  Use tools that help you move around (mobility aids) if they are needed. These include:  Canes.  Walkers.  Scooters.  Crutches.  Turn on the lights when you go into a dark area. Replace any light bulbs as soon as they burn out.  Set up your furniture so you have a clear path. Avoid moving your furniture around.  If any of your floors are uneven, fix them.  If there are any pets around you, be aware of where they are.  Review your medicines with your doctor. Some medicines can make you feel dizzy. This can increase your chance of falling. Ask your doctor what other things that you can do to help prevent falls. This information is not intended to replace advice given to you by your health care provider. Make sure you discuss any questions you have with your health care provider. Document Released: 02/20/2009 Document Revised: 10/02/2015 Document Reviewed: 05/31/2014 Elsevier Interactive Patient Education  2017 [employer].

## 2019-02-01 NOTE — Progress Notes (Signed)
.[facility]  Phone [phone]   Subjective:  Destiny Acosta is a 73 y.o. year old very pleasant female patient who presents for/with See problem oriented charting Chief Complaint  Patient presents with  . Acute Visit  . Gastroesophageal Reflux  . LUQ Pain   ROS-no fever/chills.  No chest pain or shortness of breath.  Does have epigastric abdominal pain is worse about 2 hours after meals and was worse on NSAIDs but seems to be improving now  Past Medical History-  Patient Active Problem List   Diagnosis Date Noted  . Spinal stenosis of lumbar region 01/14/2016    Priority: Medium  . Glaucoma 04/16/2015    Priority: Medium  . Suprapubic discomfort 09/17/2014    Priority: Medium  . Erosive osteoarthritis of both hands 08/10/2011    Priority: Medium  . Mild aortic regurgitation 05/14/2010    Priority: Medium  . Hyperlipidemia 12/06/2006    Priority: Medium  . Hypertension 12/06/2006    Priority: Medium  . GERD (gastroesophageal reflux disease) 12/06/2006    Priority: Medium  . Palpitations 04/16/2015    Priority: Low  . Allergic rhinitis 06/10/2014    Priority: Low  . Insomnia 06/10/2014    Priority: Low  . History of adenomatous polyp of colon 12/21/2006    Priority: Low  . Primary open-angle glaucoma 08/30/2016    Medications- reviewed and updated Current Outpatient Medications  Medication Sig Dispense Refill  . Calcium Carbonate-Vitamin D (CALTRATE 600+D) 600-400 MG-UNIT per tablet Take 1 tablet by mouth daily.      . dorzolamide-timolol (COSOPT) 22.3-6.8 MG/ML ophthalmic solution INTILL 1 DROP INTO BOTH EYES TWICE A DAY    . Estradiol (ESTRACE VA) Place vaginally once a week.    . fexofenadine (ALLEGRA) 180 MG tablet Take 180 mg by mouth daily as needed.     . fluticasone (FLONASE) 50 MCG/ACT nasal spray Place 2 sprays into both nostrils daily. 16 g 0  . latanoprost (XALATAN) 0.005 % ophthalmic solution Place 1 drop into both eyes at bedtime.    Marland Kitchen lisinopril  (PRINIVIL,ZESTRIL) 10 MG tablet TAKE 1 TABLET BY MOUTH EVERY DAY 90 tablet 2  . Multiple Vitamin (MULTIVITAMIN) capsule Take 1 capsule by mouth daily.      Marland Kitchen tretinoin (RETIN-A) 0.1 % cream Apply topically at bedtime.      . Minoxidil 5 % FOAM Apply 1 application topically daily.    . naproxen sodium (ALEVE) 220 MG tablet Take 220 mg by mouth daily as needed.    Marland Kitchen omeprazole (PRILOSEC) 40 MG capsule Take 1 capsule (40 mg total) by mouth 2 (two) times daily. 60 capsule 0   No current facility-administered medications for this visit.      Objective:  BP 118/80 (BP Location: Left Arm, Patient Position: Sitting, Cuff Size: Normal)   Pulse 78   Temp 98.1 F (36.7 C) (Temporal)   Ht 5\' 4"  (1.626 m)   Wt 156 lb 12.8 oz (71.1 kg)   SpO2 98%   BMI 26.91 kg/m  Gen: NAD, resting comfortably CV: RRR no murmurs rubs or gallops Lungs: CTAB no crackles, wheeze, rhonchi Abdomen: soft/nontender-other than mild to moderate tenderness over left lateral abdomen/nondistended/normal bowel sounds. No rebound or guarding.  No epigastric pain on examination today-no right upper quadrant pain. Ext: no edema Skin: warm, dry MSK: Arthritic appearing joints bilateral hands    Assessment and Plan  # Heartburn/LUQ Pain #OA hands  S:She takes a lot of Aleve for her arthritis. Also stopped  voltaren because she thought it was making reflux worse. She started OTC prilosec but rheumatologist increased to 40mg . She has d/ced  Aleve 1-2 months ago and started taking Tylenol instead. Felt hot burning in her throat when on aleve- but after taking prilosec and coming off of aleve symptoms have shifted more to epigastric area and does not come up in the throat as much.  Has had some off and on swallowing issues- feels like things get stuck/mildly hurt when going down. Overall has improved but just seems to be lingering.   She was taking prilosec 40mg  in AM for several months. Stopped taking on Tuesday/2 days ago and didn't  worsen significantly in last few days. Sleeping upright in bed does seem to help some.    Denies blood in the stool. Has noticed some blood on toilet paper but only after passing a hard BM.   Is doing chocolate covered peanuts at night- discussed could contribute. Usually worse 1-1.5 hours after meals  A/P: heartburn while on good dose of prilosec- possible patient has developed gastritis/stomach inflammation- will try higher dose prilosec 40mg  twice a day for a month- if no improvement go to 1 tablet per day for 7 days then stop- alert [place] at that point and we will set up H. Pylori Breath test once off medicine 2 weeks. IF no H. Pylori would refer to GI for their opinion.   - resume 40mg  daily if symptoms resolve and then consider 20mg  at follow up  For OA of hands-I did tell her I thought she could resume her Voltaren but remain off oral NSAIDs  # Left lateral abdominal pain/pain actually located below the ribs S:was moving body to swing legs around in bed and got a sudden severe sharp pain in left lower ribs-when she points pain is now/it is actually below the ribs. had severe pain but seemed to ease off over time . slowly improving with time. Happened about 2 weeks ago.   Does not worsen with walking or going up stairs.  A/P: Appears to be a muscular strain-possible obliques or transversus abdominis-discussed using heat and return to see [place] if new or worsening symptoms- no exertional symptoms  Recommended follow up: Asked her to update me in about 1 month regardless of how she does-should contact us sooner if symptoms worsen  Lab/Order associations:   ICD-10-CM   1. Gastroesophageal reflux disease, esophagitis presence not specified  K21.9   2. Primary osteoarthritis of both hands  M19.041    M19.042   3. Left lateral abdominal pain  R10.9     Meds ordered this encounter  Medications  . omeprazole (PRILOSEC) 40 MG capsule    Sig: Take 1 capsule (40 mg total) by mouth 2 (two) times  daily.    Dispense:  60 capsule    Refill:  0    Return precautions advised.  Garret Reddish, MD

## 2019-02-01 NOTE — Progress Notes (Signed)
Subjective:   Destiny Acosta is a 73 y.o. female who presents for Medicare Annual (Subsequent) preventive examination.  Review of Systems:   Cardiac Risk Factors include: advanced age (>60men, >52 women);hypertension     Objective:     Vitals: BP 118/80   Temp 98.1 F (36.7 C)   Ht 5\' 4"  (1.626 m)   Wt 156 lb 12 oz (71.1 kg)   BMI 26.91 kg/m   Body mass index is 26.91 kg/m.  Advanced Directives 02/01/2019 01/03/2018 03/01/2016 02/18/2016 09/24/2015  Does Patient Have a Medical Advance Directive? Yes Yes Yes Yes Yes  Type of Advance Directive Living will [place];Living will Living will;Healthcare Power of [place];Living will -  Does patient want to make changes to medical advance directive? No - Patient declined No - Patient declined - - -  Copy of [place] in Chart? - No - copy requested - - No - copy requested    Tobacco Social History   Tobacco Use  Smoking Status Never Smoker  Smokeless Tobacco Never Used     Counseling given: Not Answered   Clinical Intake:  Pre-visit preparation completed: Yes  Pain : No/denies pain  Diabetes: No  How often do you need to have someone help you when you read instructions, pamphlets, or other written materials from your doctor or pharmacy?: 1 - Never  Interpreter Needed?: No  Comments: accompanied by spouse Information entered by :: Denman George LPN  Past Medical History:  Diagnosis Date  . Allergy    Eggplant/KIWI  . Aortal stenosis 05/14/2010  . Arthritis   . Cancer ([place]) 11/2015   skin cancer forehead; MOHS procedure 11/2015  . GERD 12/06/2006  . Heart murmur    aortic valve  . HYPERLIPIDEMIA 12/06/2006  . Hyperplastic colon polyp   . HYPERTENSION 12/06/2006  . MITRAL VALVE PROLAPSE 12/06/2006  . Osteopenia   . Spinal stenosis    Past Surgical History:  Procedure Laterality Date  . BREAST CYST ASPIRATION    . TUBAL LIGATION     Family  History  Problem Relation Age of Onset  . Colon cancer Father 44  . Atrial fibrillation Father   . Diabetes Mother   . Heart failure Mother   . Heart disease Mother 45       CABG, quit 20 years prior o bypass  . Diabetes Maternal Grandmother   . Arthritis Sister   . Rheum arthritis Paternal Grandmother   . Lung disease Paternal Grandfather    Social History   Socioeconomic History  . Marital status: Married    Spouse name: Not on file  . Number of children: Not on file  . Years of education: Not on file  . Highest education level: Not on file  Occupational History  . Not on file  Social Needs  . Financial resource strain: Not on file  . Food insecurity    Worry: Not on file    Inability: Not on file  . Transportation needs    Medical: Not on file    Non-medical: Not on file  Tobacco Use  . Smoking status: Never Smoker  . Smokeless tobacco: Never Used  Substance and Sexual Activity  . Alcohol use: Yes    Comment: rarely may have 1 wine a month or less  . Drug use: No  . Sexual activity: Not on file  Lifestyle  . Physical activity    Days per week: Not  on file    Minutes per session: Not on file  . Stress: Not on file  Relationships  . Social Herbalist on phone: Not on file    Gets together: Not on file    Attends religious service: Not on file    Active member of club or organization: Not on file    Attends meetings of clubs or organizations: Not on file    Relationship status: Not on file  Other Topics Concern  . Not on file  Social History Narrative   Married 1967. 1 son, lives in [place]. 1 grandson in [place] age 17.       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       Hobbies: family time, walking 2.5-3 miles daily, moviews     Outpatient Encounter Medications as of 02/01/2019  Medication Sig  . Calcium Carbonate-Vitamin D (CALTRATE 600+D) 600-400 MG-UNIT per tablet Take 1 tablet by mouth daily.    .  dorzolamide-timolol (COSOPT) 22.3-6.8 MG/ML ophthalmic solution INTILL 1 DROP INTO BOTH EYES TWICE A DAY  . Estradiol (ESTRACE VA) Place vaginally once a week.  . fexofenadine (ALLEGRA) 180 MG tablet Take 180 mg by mouth daily as needed.   . fluticasone (FLONASE) 50 MCG/ACT nasal spray Place 2 sprays into both nostrils daily.  Marland Kitchen latanoprost (XALATAN) 0.005 % ophthalmic solution Place 1 drop into both eyes at bedtime.  Marland Kitchen lisinopril (PRINIVIL,ZESTRIL) 10 MG tablet TAKE 1 TABLET BY MOUTH EVERY DAY  . Minoxidil 5 % FOAM Apply 1 application topically daily.  . Multiple Vitamin (MULTIVITAMIN) capsule Take 1 capsule by mouth daily.    . naproxen sodium (ALEVE) 220 MG tablet Take 220 mg by mouth daily as needed.  Marland Kitchen omeprazole (PRILOSEC) 40 MG capsule Take 1 capsule (40 mg total) by mouth 2 (two) times daily.  Marland Kitchen tretinoin (RETIN-A) 0.1 % cream Apply topically at bedtime.     No facility-administered encounter medications on file as of 02/01/2019.     Activities of Daily Living In your present state of health, do you have any difficulty performing the following activities: 02/01/2019  Hearing? N  Vision? N  Difficulty concentrating or making decisions? N  Walking or climbing stairs? N  Dressing or bathing? N  Doing errands, shopping? N  Preparing Food and eating ? N  Using the Toilet? N  In the past six months, have you accidently leaked urine? N  Do you have problems with loss of bowel control? N  Managing your Medications? N  Managing your Finances? N  Housekeeping or managing your Housekeeping? N  Some recent data might be hidden    Patient Care Team: Marin Olp, MD as PCP - General (Family Medicine) Terrall Laity, MD as Consulting Physician (Dermatology) Lucy Antigua, MD as Consulting Physician (Rheumatology) Jola Schmidt, MD as Consulting Physician (Ophthalmology)    Assessment:   This is a routine wellness examination for V[place].  Exercise Activities and  Dietary recommendations Current Exercise Habits: Home exercise routine, Type of exercise: walking, Time (Minutes): 30, Frequency (Times/Week): 4, Weekly Exercise (Minutes/Week): 120, Intensity: Mild  Goals    . Patient Stated     Reduce acid reflux symptoms         Fall Risk Fall Risk  02/01/2019 02/01/2019 01/03/2018 06/01/2017 01/14/2016  Falls in the past year? 0 0 No No No  Number falls in past yr: 0 0 - - -  Injury with Fall? 0 0 - - -  Follow up Education provided;Falls prevention discussed;Falls evaluation completed - - - -   Is the patient's home free of loose throw rugs in walkways, pet beds, electrical cords, etc?   yes      Grab bars in the bathroom? yes      Handrails on the stairs?   yes      Adequate lighting?   yes  Timed Get Up and Go performed: completed and within normal timeframe; no gait abnormalities noted   Depression Screen PHQ 2/9 Scores 02/01/2019 02/01/2019 08/11/2018 01/03/2018  PHQ - 2 Score 1 1 0 0     Cognitive Function- no cognitive concerns at this time      6CIT Screen 02/01/2019 01/03/2018  What Year? 0 points 0 points  What month? 0 points 0 points  What time? 0 points 0 points  Count back from 20 0 points 0 points  Months in reverse 0 points 0 points  Repeat phrase 0 points -  Total Score 0 -    Immunization History  Administered Date(s) Administered  . Fluad Quad(high Dose 65+) 12/27/2018  . Influenza Split 02/08/2011, 02/08/2012  . Influenza Whole 03/02/2007, 02/20/2008  . Influenza, High Dose Seasonal PF 01/14/2016, 02/02/2017, 01/13/2018  . Influenza,inj,Quad PF,6+ Mos 02/09/2013, 01/18/2014, 02/06/2015, 02/07/2017  . Influenza-Unspecified 02/27/2004  . Pneumococcal Conjugate-13 06/10/2014  . Pneumococcal Polysaccharide-23 11/17/2007, 02/08/2011  . Td 05/10/2001  . Tdap 04/18/2013  . Zoster 12/20/2007    Qualifies for Shingles Vaccine?Discussed and patient will check with pharmacy for coverage.  Patient education handout provided     Screening Tests Health Maintenance  Topic Date Due  . MAMMOGRAM  01/07/2020  . COLONOSCOPY  03/01/2021  . TETANUS/TDAP  04/19/2023  . INFLUENZA VACCINE  Completed  . DEXA SCAN  Completed  . Hepatitis C Screening  Completed  . PNA vac Low Risk Adult  Completed    Cancer Screenings: Lung: Low Dose CT Chest recommended if Age 26-80 years, 30 pack-year currently smoking OR have quit w/in 15years. Patient does not qualify. Breast:  Up to date on Mammogram? Yes   Up to date of Bone Density/Dexa? Yes Colorectal: colonoscopy 03/01/16 with Dr. Henrene Pastor     Plan:  I have personally reviewed and addressed the Medicare Annual Wellness questionnaire and have noted the following in the patient's chart:  A. Medical and social history B. Use of alcohol, tobacco or illicit drugs  C. Current medications and supplements D. Functional ability and status E.  Nutritional status F.  Physical activity G. Advance directives H. List of other physicians I.  Hospitalizations, surgeries, and ER visits in previous 12 months J.  [place] such as hearing and vision if needed, cognitive and depression L. Referrals, records requested, and appointments- none   In addition, I have reviewed and discussed with patient certain preventive protocols, quality metrics, and best practice recommendations. A written personalized care plan for preventive services as well as general preventive health recommendations were provided to patient.   Signed,  Denman George, LPN  Nurse Health Advisor   Nurse Notes: no additional

## 2019-02-22 DIAGNOSIS — H04123 Dry eye syndrome of bilateral lacrimal glands: Secondary | ICD-10-CM | POA: Diagnosis not present

## 2019-02-22 DIAGNOSIS — H2512 Age-related nuclear cataract, left eye: Secondary | ICD-10-CM | POA: Diagnosis not present

## 2019-03-07 DIAGNOSIS — H2512 Age-related nuclear cataract, left eye: Secondary | ICD-10-CM | POA: Diagnosis not present

## 2019-03-07 DIAGNOSIS — H25812 Combined forms of age-related cataract, left eye: Secondary | ICD-10-CM | POA: Diagnosis not present

## 2019-03-07 HISTORY — PX: CATARACT EXTRACTION: SUR2

## 2019-03-12 ENCOUNTER — Other Ambulatory Visit: Payer: Self-pay

## 2019-03-12 ENCOUNTER — Ambulatory Visit
Admission: RE | Admit: 2019-03-12 | Discharge: 2019-03-12 | Disposition: A | Discharge status: Home / Self Care | Payer: Medicare Other | Source: Ambulatory Visit | Attending: Obstetrics and Gynecology | Admitting: Obstetrics and Gynecology

## 2019-03-12 DIAGNOSIS — Z1231 Encounter for screening mammogram for malignant neoplasm of breast: Secondary | ICD-10-CM

## 2019-03-13 DIAGNOSIS — L814 Other melanin hyperpigmentation: Secondary | ICD-10-CM | POA: Diagnosis not present

## 2019-03-13 DIAGNOSIS — B351 Tinea unguium: Secondary | ICD-10-CM | POA: Diagnosis not present

## 2019-03-13 DIAGNOSIS — Z85828 Personal history of other malignant neoplasm of skin: Secondary | ICD-10-CM | POA: Diagnosis not present

## 2019-03-13 DIAGNOSIS — D229 Melanocytic nevi, unspecified: Secondary | ICD-10-CM | POA: Diagnosis not present

## 2019-03-13 DIAGNOSIS — Z1283 Encounter for screening for malignant neoplasm of skin: Secondary | ICD-10-CM | POA: Diagnosis not present

## 2019-03-23 ENCOUNTER — Other Ambulatory Visit: Payer: Self-pay | Admitting: Family Medicine

## 2019-03-27 DIAGNOSIS — M154 Erosive (osteo)arthritis: Secondary | ICD-10-CM | POA: Diagnosis not present

## 2019-04-16 ENCOUNTER — Telehealth: Payer: Self-pay

## 2019-04-16 NOTE — Telephone Encounter (Signed)
Copied from [place] [id number]. Topic: Appointment Scheduling - Scheduling Inquiry for Clinic >> Apr 16, 2019 10:55 AM Lennox Solders wrote: Reason for CRM: pt heartburn has return and per pt dr hunter told her to call him and set up another appt. Dr hunter only has same day slot the rest of this month. Pt has an appt for jan 2021 would like something sooner

## 2019-04-16 NOTE — Telephone Encounter (Signed)
SDA is fine.  

## 2019-04-16 NOTE — Telephone Encounter (Signed)
Please contact pt to discuss not in office. Thank you

## 2019-04-17 NOTE — Telephone Encounter (Signed)
Please schedule pt for SDA with Hunter if availablity.

## 2019-04-18 ENCOUNTER — Ambulatory Visit (INDEPENDENT_AMBULATORY_CARE_PROVIDER_SITE_OTHER): Payer: Medicare Other

## 2019-04-18 ENCOUNTER — Encounter: Payer: Self-pay | Admitting: Family Medicine

## 2019-04-18 ENCOUNTER — Other Ambulatory Visit: Payer: Self-pay

## 2019-04-18 ENCOUNTER — Other Ambulatory Visit: Payer: Medicare Other

## 2019-04-18 ENCOUNTER — Ambulatory Visit (INDEPENDENT_AMBULATORY_CARE_PROVIDER_SITE_OTHER): Payer: Medicare Other | Admitting: Family Medicine

## 2019-04-18 VITALS — BP 124/82 | HR 67 | Temp 97.6°F | Ht 64.0 in | Wt 156.0 lb

## 2019-04-18 DIAGNOSIS — K219 Gastro-esophageal reflux disease without esophagitis: Secondary | ICD-10-CM

## 2019-04-18 DIAGNOSIS — R0602 Shortness of breath: Secondary | ICD-10-CM | POA: Diagnosis not present

## 2019-04-18 DIAGNOSIS — R0600 Dyspnea, unspecified: Secondary | ICD-10-CM

## 2019-04-18 NOTE — Progress Notes (Signed)
Phone [phone] In person visit   Subjective:   Destiny Acosta is a 72 y.o. year old very pleasant female patient who presents for/with See problem oriented charting Chief Complaint  Patient presents with   Gastroesophageal Reflux    ROS- Review of Systems  Constitutional: Negative.   HENT: Negative.   Eyes: Negative.   Respiratory: Positive for cough.        After eating associated with GERD   Cardiovascular: Negative.   Gastrointestinal: Positive for heartburn, nausea and vomiting.       Has a hard time swallowing food and can have some nausea and vomiting    Genitourinary: Negative.   Musculoskeletal: Negative.   Skin: Positive for rash.       Followed by dermatology   Neurological: Negative.   Endo/Heme/Allergies: Negative.   Psychiatric/Behavioral: Negative.      This visit occurred during the SARS-CoV-2 public health emergency.  Safety protocols were in place, including screening questions prior to the visit, additional usage of staff PPE, and extensive cleaning of exam room while observing appropriate contact time as indicated for disinfecting solutions.   Past Medical History-  Patient Active Problem List   Diagnosis Date Noted   Spinal stenosis of lumbar region 01/14/2016    Priority: Medium   Glaucoma 04/16/2015    Priority: Medium   Suprapubic discomfort 09/17/2014    Priority: Medium   Erosive osteoarthritis of both hands 08/10/2011    Priority: Medium   Mild aortic regurgitation 05/14/2010    Priority: Medium   Hyperlipidemia 12/06/2006    Priority: Medium   Hypertension 12/06/2006    Priority: Medium   GERD (gastroesophageal reflux disease) 12/06/2006    Priority: Medium   Palpitations 04/16/2015    Priority: Low   Allergic rhinitis 06/10/2014    Priority: Low   Insomnia 06/10/2014    Priority: Low   History of adenomatous polyp of colon 12/21/2006    Priority: Low   Primary open-angle glaucoma 08/30/2016    Medications-  reviewed and updated Current Outpatient Medications  Medication Sig Dispense Refill   Calcium Carbonate-Vitamin D (CALTRATE 600+D) 600-400 MG-UNIT per tablet Take 1 tablet by mouth daily.       dorzolamide-timolol (COSOPT) 22.3-6.8 MG/ML ophthalmic solution INTILL 1 DROP INTO BOTH EYES TWICE A DAY     Estradiol (ESTRACE VA) Place vaginally once a week.     fexofenadine (ALLEGRA) 180 MG tablet Take 180 mg by mouth daily as needed.      lisinopril (ZESTRIL) 10 MG tablet TAKE 1 TABLET BY MOUTH EVERY DAY 90 tablet 2   Multiple Vitamin (MULTIVITAMIN) capsule Take 1 capsule by mouth daily.       tretinoin (RETIN-A) 0.1 % cream Apply topically at bedtime.       triamcinolone cream (KENALOG) 0.1 % Apply to itchy & irritated skin of the body as needed. Do not apply to face, underarms or genital skin.     diclofenac Sodium (VOLTAREN) 1 % GEL diclofenac 1 % topical gel     estradiol (ESTRACE) 0.1 MG/GM vaginal cream Place 1 g vaginally once a week.     naproxen sodium (ALEVE) 220 MG tablet Take 220 mg by mouth daily as needed.     No current facility-administered medications for this visit.      Objective:  BP 124/82    Pulse 67    Temp 97.6 F (36.4 C) (Temporal)    Ht 5\' 4"  (1.626 m)    Wt 156 lb (  70.8 kg)    SpO2 97%    BMI 26.78 kg/m  Gen: NAD, resting comfortably CV: RRR no murmurs rubs or gallops Lungs: CTAB no crackles, wheeze, rhonchi Abdomen: soft/nontender/nondistended Ext: no edema Skin: warm, dry   EKG: Sinus bradycardia with rate 58, normal axis, normal intervals, no hypertrophy, no st or t wave chages     Assessment and Plan  # GERD  #Dysphagia S:Patient has completed one month treatment on omeprazole 40mg  twice a day- she had complete resolution of her symptoms while on it. But after she stopped the 2nd dose- symptoms returned within 3 days.    She has had increased issues with swallowing that will lead to some nausea and vomiting after eating on 2 occasions  related to coughing. She also had noticed a dry cough after eating Feels like cough worsens when reflux is worse. Notes feels like foods get stuck with eating- feels like any foods and also can feel like liquids get stuck but no regurgitation. Has to get patted on the back.    She has been sleeping at angle with no improvement of symptoms. Patient did have 3 tums yesterday that provided improvement with symptoms at the time.   Last prilosec was over 2 weeks ago.Has stayed off aleve thankfully- only doing tylenol  Sounds like sister had H. Pylori A/P: Patient with poor control of GERD on omeprazole 40 mg once a day-fortunately she has been able to come off for the last 2 weeks and we will do an H. pylori breath test today.  If this is positive then we will treat for H. pylori.  If negative given the dysphagia she is experiencing we will refer to GI for consideration of EGD  # Dyspnea S:Denies chest pain but has noted in last few weeks that she has had labored breathing and is slower than usual. Had noted previously.  No palpitations or abnormal sweating reported A/P: 73 year old female with some shortness of breath with exertion.  She is a never smoker-we will check a chest x-ray.  Has mild aortic regurgitation but had echo in 2019 which was largely stable-do not feel strongly about repeating at this time.  EKG was largely reassuring today with sinus bradycardia only.  We discussed potential cardiology referral-she would like to follow-up at January visit before deciding-if she has worsening symptoms she will certainly let me know immediately  Recommended follow up: As January follow-up scheduled  Lab/Order associations:   ICD-10-CM   1. Gastroesophageal reflux disease, unspecified whether esophagitis present  K21.9 H. pylori breath test    H. pylori breath test  2. Dyspnea, unspecified type  R06.00 DG Chest [facility]    EKG 12-Lead    Return precautions advised.  Garret Reddish, MD

## 2019-04-18 NOTE — Assessment & Plan Note (Signed)
#  Dysphagia S:Patient has completed one month treatment on omeprazole 40mg  twice a day- she had complete resolution of her symptoms while on it. But after she stopped the 2nd dose- symptoms returned within 3 days.    She has had increased issues with swallowing that will lead to some nausea and vomiting after eating on 2 occasions related to coughing. She also had noticed a dry cough after eating Feels like cough worsens when reflux is worse. Notes feels like foods get stuck with eating- feels like any foods and also can feel like liquids get stuck but no regurgitation. Has to get patted on the back.    She has been sleeping at angle with no improvement of symptoms. Patient did have 3 tums yesterday that provided improvement with symptoms at the time.   Last prilosec was over 2 weeks ago.Has stayed off aleve thankfully- only doing tylenol  Sounds like sister had H. Pylori A/P: Patient with poor control of GERD on omeprazole 40 mg once a day-fortunately she has been able to come off for the last 2 weeks and we will do an H. pylori breath test today.  If this is positive then we will treat for H. pylori.  If negative given the dysphagia she is experiencing we will refer to GI

## 2019-04-18 NOTE — Patient Instructions (Addendum)
3 things we need to do today 1. H. Pylori breath test today 2. EKG 3. CXR if able today- schedule to do this at another date when convenient for you.   For H. Pylori 1. If this is positive we will treat you with antibiotic therapy 2. If it is negative- I am going to refer you to GI to consider endoscopy

## 2019-04-19 LAB — H. PYLORI BREATH TEST: H. pylori Breath Test: NOT DETECTED

## 2019-04-19 NOTE — Progress Notes (Signed)
Your H. pylori breath test showed no H. pylori.  I have thought some more about your symptoms-we could also try putting you on omeprazole 40 mg once a day to see if that resolves the symptoms-we could send in #90 with 3 refills.  I know we also had discussed referring you to gastroenterology for their opinion-would you like for [place] to place referral?  Our team can also send in omeprazole even if you do go to see GI

## 2019-05-14 ENCOUNTER — Other Ambulatory Visit: Payer: Self-pay

## 2019-05-15 ENCOUNTER — Encounter: Payer: Self-pay | Admitting: Family Medicine

## 2019-05-15 ENCOUNTER — Ambulatory Visit (INDEPENDENT_AMBULATORY_CARE_PROVIDER_SITE_OTHER): Payer: Medicare Other | Admitting: Family Medicine

## 2019-05-15 VITALS — BP 126/84 | HR 80 | Temp 98.3°F | Ht 64.0 in | Wt 156.0 lb

## 2019-05-15 DIAGNOSIS — R21 Rash and other nonspecific skin eruption: Secondary | ICD-10-CM | POA: Diagnosis not present

## 2019-05-15 DIAGNOSIS — K219 Gastro-esophageal reflux disease without esophagitis: Secondary | ICD-10-CM | POA: Diagnosis not present

## 2019-05-15 DIAGNOSIS — I10 Essential (primary) hypertension: Secondary | ICD-10-CM | POA: Diagnosis not present

## 2019-05-15 MED ORDER — OMEPRAZOLE 40 MG PO CPDR: 40.0000 mg | DELAYED_RELEASE_CAPSULE | Freq: Every day | ORAL | 5 refills | Status: DC

## 2019-05-15 MED ORDER — IRBESARTAN 75 MG PO TABS: 75.0000 mg | ORAL_TABLET | Freq: Every day | ORAL | 5 refills | Status: DC

## 2019-05-15 NOTE — Patient Instructions (Addendum)
I think your cough is a combo of lisinopril contributing to chronic cough as well as reflux causing irritation. I think trouble swallowing is also related to reflux.   1. Stop lisinopril 10mg  2. Start irbesartan 75 mg for blood pressure  3. Start prilosec 40mg  daily. See if symptoms resolve within a month back on this- if you can try over the counter 20mg  and see if that works just once a day - if trouble swallowing does not resolve on prilosec- lets touch base sooner and could consider higher dose prilosec as I know you prefer to get vaccine first before seeing GI  Get pest inspection for possible bed bugs. If negative could use over the counter scabies treatment- permethrin. Follow up with dermatology if no improvement  Recommended follow up: Return in about 4 months (around 09/12/2019) for follow up- or sooner if needed.

## 2019-05-15 NOTE — Progress Notes (Signed)
Phone [phone] In person visit   Subjective:   Destiny Acosta is a 74 y.o. year old very pleasant female patient who presents for/with See problem oriented charting Chief Complaint  Patient presents with  . Follow-up   ROS-not ill appearing, no fever/chills. No new medications. Not immunocompromised. No mucus membrane involvement.   This visit occurred during the SARS-CoV-2 public health emergency.  Safety protocols were in place, including screening questions prior to the visit, additional usage of staff PPE, and extensive cleaning of exam room while observing appropriate contact time as indicated for disinfecting solutions.   Past Medical History-  Patient Active Problem List   Diagnosis Date Noted  . Spinal stenosis of lumbar region 01/14/2016    Priority: Medium  . Glaucoma 04/16/2015    Priority: Medium  . Suprapubic discomfort 09/17/2014    Priority: Medium  . Erosive osteoarthritis of both hands 08/10/2011    Priority: Medium  . Mild aortic regurgitation 05/14/2010    Priority: Medium  . Hyperlipidemia 12/06/2006    Priority: Medium  . Hypertension 12/06/2006    Priority: Medium  . GERD (gastroesophageal reflux disease) 12/06/2006    Priority: Medium  . Palpitations 04/16/2015    Priority: Low  . Allergic rhinitis 06/10/2014    Priority: Low  . Insomnia 06/10/2014    Priority: Low  . History of adenomatous polyp of colon 12/21/2006    Priority: Low  . Primary open-angle glaucoma 08/30/2016    Medications- reviewed and updated Current Outpatient Medications  Medication Sig Dispense Refill  . Calcium Carbonate-Vitamin D (CALTRATE 600+D) 600-400 MG-UNIT per tablet Take 1 tablet by mouth daily.      . diclofenac Sodium (VOLTAREN) 1 % GEL diclofenac 1 % topical gel    . dorzolamide-timolol (COSOPT) 22.3-6.8 MG/ML ophthalmic solution INTILL 1 DROP INTO BOTH EYES TWICE A DAY    . Estradiol (ESTRACE VA) Place vaginally once a week.    . estradiol (ESTRACE) 0.1  MG/GM vaginal cream Place 1 g vaginally once a week.    . fexofenadine (ALLEGRA) 180 MG tablet Take 180 mg by mouth daily as needed.     Marland Kitchen lisinopril (ZESTRIL) 10 MG tablet TAKE 1 TABLET BY MOUTH EVERY DAY 90 tablet 2  . Multiple Vitamin (MULTIVITAMIN) capsule Take 1 capsule by mouth daily.      . naproxen sodium (ALEVE) 220 MG tablet Take 220 mg by mouth daily as needed.    . tretinoin (RETIN-A) 0.1 % cream Apply topically at bedtime.      . triamcinolone cream (KENALOG) 0.1 % Apply to itchy & irritated skin of the body as needed. Do not apply to face, underarms or genital skin.     No current facility-administered medications for this visit.     Objective:  BP 126/84   Pulse 80   Temp 98.3 F (36.8 C) (Temporal)   Ht 5\' 4"  (1.626 m)   Wt 156 lb (70.8 kg)   SpO2 96%   BMI 26.78 kg/m  Gen: NAD, resting comfortably CV: RRR no murmurs rubs or gallops Lungs: CTAB no crackles, wheeze, rhonchi Ext: no edema Skin: warm, dry, erythematous papules on lower back up midlineto mid back. Not on wrists.     Assessment and Plan   # GERD #chronic cough S:Patient has completed her medications as directed after negative H priori test. She had improvement of symptoms while on medications. A few days after she finished she had one episode of very mild heartburn. Her cough had  been constant after stopping medications. She is still having some symptoms of trouble swallowing.   Ongoing issues with chronic cough. Previously treated for cough with allegra. When she is on prilosec her symptoms pretty much resolve. Also apparently got better on clotrimazole troches for thrush A/P: From avs "I think your cough is a combo of lisinopril contributing to chronic cough as well as reflux causing irritation. I think trouble swallowing is also related to reflux.  ... 3. Start prilosec 40mg  daily. See if symptoms resolve within a month back on this- if you can try over the counter 20mg  and see if that works just  once a day - if trouble swallowing does not resolve on prilosec- lets touch base sooner and could consider higher dose prilosec as I know you prefer to get vaccine first before seeing GI"   #hypertension S: compliant with  lisinopril 10mg  daily BP Readings from Last 3 Encounters:  05/15/19 126/84  04/18/19 124/82  02/01/19 118/80  A/P: from avs related to chronic cough "1. Stop lisinopril 10mg  2. Start irbesartan 75 mg for blood pressure"  # Rash S:itchy bumps on back that tend to come and stay for a while before resolving- also on arms and upper thighs. Not in groin, wrists, axilla. Moved into rental home 14 months ago. Started rash 3-4 months ago- not sure if has new detergents/fabric softeners, sheets. Same soap and shampoo.   Saw rheumatology in case psoriasis but did not think so. Saw dermatology Dr. Lovena Le but it was getting better. Cyclical rounds of 3-4 weeks to get better and go away. Almost feels them coming up under skin before they come up.   No pets so likely not fleas A/P: no red flags . From avs "Get pest inspection for possible bed bugs. If negative could use over the counter scabies treatment- permethrin. Follow up with dermatology if no improvement"   Recommended follow up: Return in about 4 months (around 09/12/2019) for follow up- or sooner if needed.  Lab/Order associations:   ICD-10-CM   1. Gastroesophageal reflux disease, unspecified whether esophagitis present  K21.9   2. Essential hypertension  I10   3. Rash  R21     Meds ordered this encounter  Medications  . irbesartan (AVAPRO) 75 MG tablet    Sig: Take 1 tablet (75 mg total) by mouth daily.    Dispense:  30 tablet    Refill:  5  . omeprazole (PRILOSEC) 40 MG capsule    Sig: Take 1 capsule (40 mg total) by mouth daily.    Dispense:  30 capsule    Refill:  5    Return precautions advised.  Garret Reddish, MD

## 2019-05-17 ENCOUNTER — Telehealth: Payer: Self-pay | Admitting: Family Medicine

## 2019-05-17 MED ORDER — PERMETHRIN 5 % EX CREA: 1.0000 "application " | TOPICAL_CREAM | Freq: Once | CUTANEOUS | 1 refills | Status: AC

## 2019-05-17 NOTE — Addendum Note (Signed)
Addended by: Marin Olp on: 05/17/2019 11:09 PM   Modules accepted: Orders

## 2019-05-17 NOTE — Telephone Encounter (Signed)
Please call patient she will need virtual app with someone so that she can have rash evaluated.

## 2019-05-17 NOTE — Telephone Encounter (Signed)
Scheduled her for tomorrow with 05/18/19 @ 1:20am

## 2019-05-17 NOTE — Telephone Encounter (Signed)
Pt called asking for Dr. Yong Channel to prescribe the medication Permethrine for her rash. Pt stated that 2 pharmacists recommended the medication be prescribed to her due to OTC medications not working well. Pt also asked if Dr. Yong Channel could prescribe enough for both her and her husband to use due to them both having the rash. Please advise.

## 2019-05-18 ENCOUNTER — Ambulatory Visit: Payer: Medicare Other | Admitting: Family Medicine

## 2019-05-18 ENCOUNTER — Encounter: Payer: Self-pay | Admitting: Family Medicine

## 2019-05-18 NOTE — Telephone Encounter (Signed)
LVM FOR PATIENT TO KNOW THAT I HAD CANCELLED HER APPT AND THAT SHE COULD PICK UP HER MEDICATION FROM THE PHARMACY.

## 2019-05-18 NOTE — Telephone Encounter (Signed)
Ok to call patient and cancel appointment.

## 2019-05-27 ENCOUNTER — Ambulatory Visit: Payer: Medicare Other | Attending: Internal Medicine

## 2019-05-27 DIAGNOSIS — Z23 Encounter for immunization: Secondary | ICD-10-CM | POA: Insufficient documentation

## 2019-05-27 NOTE — Progress Notes (Signed)
   Covid-19 Vaccination Clinic  Name:  INDIYAH BUBAK    MRN: [id number] DOB: Oct 06, 1945  05/27/2019  Ms. Milleson was observed post Covid-19 immunization for 15 minutes without incidence. She was provided with Vaccine Information Sheet and instruction to access the V-Safe system.   Ms. Karan was instructed to call 911 with any severe reactions post vaccine: Marland Kitchen Difficulty breathing  . Swelling of your face and throat  . A fast heartbeat  . A bad rash all over your body  . Dizziness and weakness    Left 1155

## 2019-05-28 ENCOUNTER — Other Ambulatory Visit (HOSPITAL_COMMUNITY)
Admission: RE | Admit: 2019-05-28 | Discharge: 2019-05-28 | Disposition: A | Discharge status: Home / Self Care | Payer: Medicare Other | Source: Other Acute Inpatient Hospital | Attending: Family Medicine | Admitting: Family Medicine

## 2019-05-28 DIAGNOSIS — H4923 Sixth [abducent] nerve palsy, bilateral: Secondary | ICD-10-CM | POA: Insufficient documentation

## 2019-05-28 DIAGNOSIS — H5000 Unspecified esotropia: Secondary | ICD-10-CM | POA: Diagnosis not present

## 2019-05-28 DIAGNOSIS — R519 Headache, unspecified: Secondary | ICD-10-CM | POA: Diagnosis not present

## 2019-05-28 LAB — CBC WITH DIFFERENTIAL/PLATELET
Abs Immature Granulocytes: 0.01 10*3/uL (ref 0.00–0.07)
Basophils Absolute: 0.1 10*3/uL (ref 0.0–0.1)
Basophils Relative: 1 %
Eosinophils Absolute: 0.3 10*3/uL (ref 0.0–0.5)
Eosinophils Relative: 6 %
HCT: 40.4 % (ref 36.0–46.0)
Hemoglobin: 13.8 g/dL (ref 12.0–15.0)
Immature Granulocytes: 0 %
Lymphocytes Relative: 27 %
Lymphs Abs: 1.3 10*3/uL (ref 0.7–4.0)
MCH: 31.5 pg (ref 26.0–34.0)
MCHC: 34.2 g/dL (ref 30.0–36.0)
MCV: 92.2 fL (ref 80.0–100.0)
Monocytes Absolute: 0.5 10*3/uL (ref 0.1–1.0)
Monocytes Relative: 11 %
Neutro Abs: 2.6 10*3/uL (ref 1.7–7.7)
Neutrophils Relative %: 55 %
Platelets: 295 10*3/uL (ref 150–400)
RBC: 4.38 MIL/uL (ref 3.87–5.11)
RDW: 12.6 % (ref 11.5–15.5)
WBC: 4.7 10*3/uL (ref 4.0–10.5)
nRBC: 0 % (ref 0.0–0.2)

## 2019-05-28 LAB — TSH: TSH: 1.374 u[IU]/mL (ref 0.350–4.500)

## 2019-05-28 LAB — SEDIMENTATION RATE: Sed Rate: 11 mm/hr (ref 0–22)

## 2019-05-28 LAB — T4, FREE: Free T4: 0.78 ng/dL (ref 0.61–1.12)

## 2019-05-28 LAB — C-REACTIVE PROTEIN: CRP: 0.9 mg/dL (ref <1.0)

## 2019-06-07 DIAGNOSIS — R519 Headache, unspecified: Secondary | ICD-10-CM | POA: Diagnosis not present

## 2019-06-07 DIAGNOSIS — H5000 Unspecified esotropia: Secondary | ICD-10-CM | POA: Diagnosis not present

## 2019-06-17 ENCOUNTER — Ambulatory Visit: Payer: Medicare Other | Attending: Internal Medicine

## 2019-06-17 DIAGNOSIS — Z23 Encounter for immunization: Secondary | ICD-10-CM | POA: Insufficient documentation

## 2019-06-17 NOTE — Progress Notes (Signed)
   Covid-19 Vaccination Clinic  Name:  Destiny Acosta    MRN: [id number] DOB: Aug 18, 1945  06/17/2019  [name] was observed post Covid-19 immunization for 15 minutes without incidence. She was provided with Vaccine Information Sheet and instruction to access the V-Safe system.   [name] was instructed to call 911 with any severe reactions post vaccine: Marland Kitchen Difficulty breathing  . Swelling of your face and throat  . A fast heartbeat  . A bad rash all over your body  . Dizziness and weakness    Immunizations Administered    Name Date Dose VIS Date Route   Pfizer COVID-19 Vaccine 06/17/2019 10:35 AM 0.3 mL 04/20/2019 Intramuscular   Manufacturer: [place]   Lot: [id number]   Derma: [id number]

## 2019-06-29 DIAGNOSIS — H5000 Unspecified esotropia: Secondary | ICD-10-CM | POA: Diagnosis not present

## 2019-07-20 DIAGNOSIS — H524 Presbyopia: Secondary | ICD-10-CM | POA: Diagnosis not present

## 2019-07-20 DIAGNOSIS — H401132 Primary open-angle glaucoma, bilateral, moderate stage: Secondary | ICD-10-CM | POA: Diagnosis not present

## 2019-08-08 ENCOUNTER — Telehealth: Payer: Self-pay | Admitting: Family Medicine

## 2019-08-08 NOTE — Telephone Encounter (Signed)
SDA is fine.  

## 2019-08-08 NOTE — Telephone Encounter (Signed)
Patient is calling in asking for a follow up for a rash and asked if an appointment but next available is not until 09/10/19, can we put pt in same day"?

## 2019-08-13 ENCOUNTER — Encounter: Payer: Self-pay | Admitting: Family Medicine

## 2019-08-13 ENCOUNTER — Other Ambulatory Visit: Payer: Self-pay

## 2019-08-13 ENCOUNTER — Ambulatory Visit (INDEPENDENT_AMBULATORY_CARE_PROVIDER_SITE_OTHER): Payer: Medicare Other | Admitting: Family Medicine

## 2019-08-13 VITALS — BP 130/82 | HR 95 | Temp 97.6°F | Ht 64.0 in | Wt 155.0 lb

## 2019-08-13 DIAGNOSIS — B86 Scabies: Secondary | ICD-10-CM | POA: Diagnosis not present

## 2019-08-13 DIAGNOSIS — I10 Essential (primary) hypertension: Secondary | ICD-10-CM | POA: Diagnosis not present

## 2019-08-13 MED ORDER — IVERMECTIN 3 MG PO TABS: 200.0000 ug/kg | ORAL_TABLET | ORAL | 0 refills | Status: DC

## 2019-08-13 NOTE — Progress Notes (Signed)
Phone [phone] In person visit   Subjective:   Destiny Acosta is a 74 y.o. year old very pleasant female patient who presents for/with See problem oriented charting Chief Complaint  Patient presents with  . Rash   This visit occurred during the SARS-CoV-2 public health emergency.  Safety protocols were in place, including screening questions prior to the visit, additional usage of staff PPE, and extensive cleaning of exam room while observing appropriate contact time as indicated for disinfecting solutions.   Past Medical History-  Patient Active Problem List   Diagnosis Date Noted  . Spinal stenosis of lumbar region 01/14/2016    Priority: Medium  . Glaucoma 04/16/2015    Priority: Medium  . Suprapubic discomfort 09/17/2014    Priority: Medium  . Erosive osteoarthritis of both hands 08/10/2011    Priority: Medium  . Mild aortic regurgitation 05/14/2010    Priority: Medium  . Hyperlipidemia 12/06/2006    Priority: Medium  . Hypertension 12/06/2006    Priority: Medium  . GERD (gastroesophageal reflux disease) 12/06/2006    Priority: Medium  . Palpitations 04/16/2015    Priority: Low  . Allergic rhinitis 06/10/2014    Priority: Low  . Insomnia 06/10/2014    Priority: Low  . History of adenomatous polyp of colon 12/21/2006    Priority: Low  . Primary open-angle glaucoma 08/30/2016    Medications- reviewed and updated Current Outpatient Medications  Medication Sig Dispense Refill  . Calcium Carbonate-Vitamin D (CALTRATE 600+D) 600-400 MG-UNIT per tablet Take 1 tablet by mouth daily.      . dorzolamide-timolol (COSOPT) 22.3-6.8 MG/ML ophthalmic solution INTILL 1 DROP INTO BOTH EYES TWICE A DAY    . estradiol (ESTRACE) 0.1 MG/GM vaginal cream Place 1 g vaginally once a week.    . fexofenadine (ALLEGRA) 180 MG tablet Take 180 mg by mouth daily as needed.     . irbesartan (AVAPRO) 75 MG tablet Take 1 tablet (75 mg total) by mouth daily. 30 tablet 5  . Multiple Vitamin  (MULTIVITAMIN) capsule Take 1 capsule by mouth daily.      . naproxen sodium (ALEVE) 220 MG tablet Take 220 mg by mouth daily as needed.    Marland Kitchen omeprazole (PRILOSEC) 40 MG capsule Take 1 capsule (40 mg total) by mouth daily. 30 capsule 5  . tretinoin (RETIN-A) 0.1 % cream Apply topically at bedtime.      . triamcinolone cream (KENALOG) 0.1 % Apply to itchy & irritated skin of the body as needed. Do not apply to face, underarms or genital skin.    Marland Kitchen ivermectin (STROMECTOL) 3 MG TABS tablet Take 4.5 tablets (13,500 mcg total) by mouth once a week. 9 tablet 0   No current facility-administered medications for this visit.     Objective:  BP 130/82   Pulse 95   Temp 97.6 F (36.4 C) (Temporal)   Ht 5\' 4"  (1.626 m)   Wt 155 lb (70.3 kg)   SpO2 95%   BMI 26.61 kg/m  Gen: NAD, resting comfortably CV: RRR no murmurs rubs or gallops Lungs: CTAB no crackles, wheeze, rhonchi Ext: no edema Skin: warm, dry, multiple erythematous shallow ulcerations-some with excoriation on bilateral arms, stomach    Assessment and Plan  # Rash  S: At last visit patient with pruritic rash-patient also reported husband was affected.  Had seen rheumatology in case they thought it was psoriasis but they did not.  Had seen dermatology but at that time it seemed to be getting better  so no further work-up was started.  Started primarily on back but seem to come and go in waves.  Also on elbows, arms, upper thighs.  Not reporting in groin, wrist, axilla.  Had moved into rental home 16 months prior but rash did not start until 3 to 4 months before last visit.  No new contacts were noted.  Patient did note it felt like something was crawling under her skin.  She had already tried triamcinolone  Ultimately,exterminator came out- was told not to take sheets off- no evidence of bed begs-patient already been used plastic wrap around mattress so would not have been possible most likely. They also tried traps for bed bugs which have  not collected anyone.   Once bedbug evaluation was negative-I told patient I was concerned for possible scabies and that if scabies treatment was not effective would recommend dermatology visit.   She started permethin and had relief with this- helped she and her husband.   About 2 weeks later had recurrence and then they did second round and that was helpful. Husband is still doing fine but her rash is starting to come bakc about a month later- elbows, stomach, knees. Husband not having recurrence. Washed everything she could in hot water and set in plastic bags for a week but thinks that she could not wash.  A/P: Recurrent rash that responds well to permethrin on 2 separate occasions for initial treatment of scabies.  With recurrence of similar rash we opted to try ivermectin.  Her husband had good response with permethrin and we will retreat him on the same days she takes her ivermectin just to be on the safe side. -Still with unclear etiology of where scabies was obtained  #hypertension S: compliant with irbesartan 75 mg.  Off ACE inhibitor in case this was a cause of chronic cough BP Readings from Last 3 Encounters:  08/13/19 130/82  05/15/19 126/84  04/18/19 124/82  A/P: Reasonable control today-continue current medication-we will check in next visit on if changing medication was helpful for cough-sometimes response can be delayed when coming off ACE inhibitor   Recommended follow up: We already have planned follow-up in a month it appears Future Appointments  Date Time Provider [place]  09/13/2019  1:20 PM Marin Olp, MD [facility]    Lab/Order associations:   ICD-10-CM   1. Scabies  B86   2. Essential hypertension  I10     Meds ordered this encounter  Medications  . ivermectin (STROMECTOL) 3 MG TABS tablet    Sig: Take 4.5 tablets (13,500 mcg total) by mouth once a week.    Dispense:  9 tablet    Refill:  0    Time Spent: 22 minutes of total time (4:08  PM- 4:24 PM, 9:24 PM-9:30 PM) was spent on the date of the encounter performing the following actions: chart review prior to seeing the patient, obtaining history, performing a medically necessary exam, counseling on the treatment plan, placing orders, and documenting in our EHR.   Return precautions advised.  Garret Reddish, MD

## 2019-08-13 NOTE — Patient Instructions (Addendum)
Try ivermectin this time- I would still be very open to dermatology's opinion but with your response previously to permethrin this is acting like scabies   Recommended follow up: if you fail to improve please get dermatology's opinion

## 2019-08-14 ENCOUNTER — Encounter: Payer: Self-pay | Admitting: Family Medicine

## 2019-08-16 NOTE — Telephone Encounter (Signed)
Patient calling in regarding her husband . Please return pt call as soon as  Possible

## 2019-08-23 ENCOUNTER — Telehealth: Payer: Self-pay

## 2019-08-23 NOTE — Telephone Encounter (Signed)
Yes thanks just to be on the safe side they can take the second dose at 1 to 2 weeks out from the initial dose to make sure to fully eradicate the issue

## 2019-08-23 NOTE — Telephone Encounter (Signed)
Patient states that her and her husband have taken the first does of ivermectin and everything is going great but would like to know will they still need to take the 2nd dose of ivermectin.

## 2019-08-23 NOTE — Telephone Encounter (Signed)
See below

## 2019-08-24 NOTE — Telephone Encounter (Signed)
Called and left below information on pt vm.

## 2019-09-04 ENCOUNTER — Other Ambulatory Visit: Payer: Self-pay | Admitting: Family Medicine

## 2019-09-13 ENCOUNTER — Other Ambulatory Visit: Payer: Self-pay

## 2019-09-13 ENCOUNTER — Encounter: Payer: Self-pay | Admitting: Family Medicine

## 2019-09-13 ENCOUNTER — Ambulatory Visit (INDEPENDENT_AMBULATORY_CARE_PROVIDER_SITE_OTHER): Payer: Medicare Other | Admitting: Family Medicine

## 2019-09-13 VITALS — BP 126/78 | HR 78 | Temp 97.7°F | Ht 64.0 in | Wt 154.6 lb

## 2019-09-13 DIAGNOSIS — Z8249 Family history of ischemic heart disease and other diseases of the circulatory system: Secondary | ICD-10-CM

## 2019-09-13 DIAGNOSIS — E785 Hyperlipidemia, unspecified: Secondary | ICD-10-CM | POA: Diagnosis not present

## 2019-09-13 DIAGNOSIS — K219 Gastro-esophageal reflux disease without esophagitis: Secondary | ICD-10-CM

## 2019-09-13 DIAGNOSIS — I10 Essential (primary) hypertension: Secondary | ICD-10-CM | POA: Diagnosis not present

## 2019-09-13 NOTE — Progress Notes (Signed)
Phone [phone] In person visit   Subjective:   Destiny Acosta is a 74 y.o. year old very pleasant female patient who presents for/with See problem oriented charting Chief Complaint  Patient presents with  . Hypertension    This visit occurred during the SARS-CoV-2 public health emergency.  Safety protocols were in place, including screening questions prior to the visit, additional usage of staff PPE, and extensive cleaning of exam room while observing appropriate contact time as indicated for disinfecting solutions.   Past Medical History-  Patient Active Problem List   Diagnosis Date Noted  . Spinal stenosis of lumbar region 01/14/2016    Priority: Medium  . Glaucoma 04/16/2015    Priority: Medium  . Suprapubic discomfort 09/17/2014    Priority: Medium  . Erosive osteoarthritis of both hands 08/10/2011    Priority: Medium  . Mild aortic regurgitation 05/14/2010    Priority: Medium  . Hyperlipidemia 12/06/2006    Priority: Medium  . Hypertension 12/06/2006    Priority: Medium  . GERD (gastroesophageal reflux disease) 12/06/2006    Priority: Medium  . Palpitations 04/16/2015    Priority: Low  . Allergic rhinitis 06/10/2014    Priority: Low  . Insomnia 06/10/2014    Priority: Low  . History of adenomatous polyp of colon 12/21/2006    Priority: Low  . Primary open-angle glaucoma 08/30/2016    Medications- reviewed and updated Current Outpatient Medications  Medication Sig Dispense Refill  . Calcium Carbonate-Vitamin D (CALTRATE 600+D) 600-400 MG-UNIT per tablet Take 1 tablet by mouth daily.      . dorzolamide-timolol (COSOPT) 22.3-6.8 MG/ML ophthalmic solution INTILL 1 DROP INTO BOTH EYES TWICE A DAY    . estradiol (ESTRACE) 0.1 MG/GM vaginal cream Place 1 g vaginally once a week.    . fexofenadine (ALLEGRA) 180 MG tablet Take 180 mg by mouth daily as needed.     . irbesartan (AVAPRO) 75 MG tablet TAKE 1 TABLET BY MOUTH EVERY DAY 90 tablet 1  . Multiple Vitamin  (MULTIVITAMIN) capsule Take 1 capsule by mouth daily.      . naproxen sodium (ALEVE) 220 MG tablet Take 220 mg by mouth daily as needed.    Marland Kitchen omeprazole (PRILOSEC) 40 MG capsule Take 1 capsule (40 mg total) by mouth daily. 30 capsule 5  . tretinoin (RETIN-A) 0.1 % cream Apply topically at bedtime.      . triamcinolone cream (KENALOG) 0.1 % Apply to itchy & irritated skin of the body as needed. Do not apply to face, underarms or genital skin.     No current facility-administered medications for this visit.     Objective:  BP 126/78   Pulse 78   Temp 97.7 F (36.5 C) (Temporal)   Ht 5\' 4"  (1.626 m)   Wt 154 lb 9.6 oz (70.1 kg)   SpO2 98%   BMI 26.54 kg/m  Gen: NAD, resting comfortably CV: RRR stable diastolic murmur Lungs: CTAB no crackles, wheeze, rhonchi Abdomen: soft/nontender/nondistended/normal bowel sounds. No rebound or guarding.  Ext: no edema Skin: warm, dry     Assessment and Plan   #hypertension S: medication: compliant with irbesartan 75 mg. Cough has been much improved off of lisinopril.  BP Readings from Last 3 Encounters:  09/13/19 126/78  08/13/19 130/82  05/15/19 126/84  A/P: Stable. Continue current medications.   # GERD  S: Patient with reflux and chronic cough dating back to December 2020 in January 2021.  At that time we thought chronic cough may  be a combination of lisinopril as well as GERD.  She originally was having trouble swallowing.  Since last visit she has been using Omeprazole as needed. Only takes if she is having symptoms or is going to take Aleve. She has not taken on over two weeks.  Eating a bland diet for most part. Prior trouble swallowing essentially resolved. She only has issues with salmon- feels like throat gets tight and hard for her to swallow.   She is still having some some symptoms with the trouble swallowing.  She ends up throwing up and feels better- may have allergy to salmon.    She does not want to get referral to GI at  this time due to other obligations at this time.   A/P: Acid reflux have drastically improved with short course of omeprazole 40 mg.  She can continue using this on an as-needed basis if she takes Advil or Aleve or if she has a flare of reflux.  After this runs out she can simply purchase omeprazole 20 mg over-the-counter.  Her trouble swallowing is isolated to salmon only-she is going to avoid this but if she has issues with swallowing with other foods will contact me immediately-I agree we can hold off on referring to GI at this time  Chronic cough is better off lisinopril as well-reflux may have contributed of course.   # prior scabies- did have some itching on Tuesday but in generally itching has been drastically better after treatment.    #hyperlipidemia/family history of coronary artery disease S: Medication: none. Mom and grandmother with heart disease- weight issues and diabetes though.   Lab Results  Component Value Date   CHOL 188 10/19/2016   HDL 59.00 10/19/2016   LDLCALC 112 (H) 10/19/2016   LDLDIRECT 119.0 06/01/2017   TRIG 89.0 10/19/2016   CHOLHDL 3 10/19/2016   A/P: Mildly elevated cholesterol levels on last check but elevated ASCVD risk of over 18% -we will update these with lab work today.  Patient is also interested in doing a coronary CT to get more information on the amount of plaque on her heart-if she had any amount of plaque she would be more interested in statin medication particularly with her family history.  Recommended follow up: 6 months follow up   Lab/Order associations: We changed orders to future as she would like to come back fasting   ICD-10-CM   1. Essential hypertension  I10 CBC with Differential/Platelet    Comprehensive metabolic panel    CANCELED: CBC with Differential/Platelet    CANCELED: Comprehensive metabolic panel  2. Gastroesophageal reflux disease, unspecified whether esophagitis present  K21.9   3. Hyperlipidemia, unspecified  hyperlipidemia type  E78.5 CT CARDIAC SCORING    Lipid panel    CANCELED: Lipid panel  4. Family history of coronary artery disease  Z82.49 Lipid panel   Return precautions advised.  Garret Reddish, MD

## 2019-09-13 NOTE — Assessment & Plan Note (Signed)
S: Patient with reflux and chronic cough dating back to December 2020 in January 2021.  At that time we thought chronic cough may be a combination of lisinopril as well as GERD.  She originally was having trouble swallowing.  Since last visit she has been using Omeprazole as needed. Only takes if she is having symptoms or is going to take Aleve. She has not taken on over two weeks.  Eating a bland diet for most part. Prior trouble swallowing essentially resolved. She only has issues with salmon- feels like throat gets tight and hard for her to swallow.   She is still having some some symptoms with the trouble swallowing.  She ends up throwing up and feels better- may have allergy to salmon.    She does not want to get referral to GI at this time due to other obligations at this time.   A/P: Acid reflux have drastically improved with short course of omeprazole 40 mg.  She can continue using this on an as-needed basis if she takes Advil or Aleve or if she has a flare of reflux.  After this runs out she can simply purchase omeprazole 20 mg over-the-counter.  Her trouble swallowing is isolated to salmon only-she is going to avoid this but if she has issues with swallowing with other foods will contact me immediately-I agree we can hold off on referring to GI at this time  Chronic cough is better off lisinopril as well

## 2019-09-13 NOTE — Patient Instructions (Addendum)
Please stop by lab before you go If you have mychart- we will send your results within 3 business days of [place] receiving them.  If you do not have mychart- we will call you about results within 5 business days of [place] receiving them.     Avoid salmon due trouble swallowing. If you start having increased issues let us know and we will talk about sending you to GI for further work up.   If reflux continues you can pick up omeprazole over the counter. If any new symptoms or increase in symptoms give our office a call.

## 2019-09-17 ENCOUNTER — Other Ambulatory Visit (INDEPENDENT_AMBULATORY_CARE_PROVIDER_SITE_OTHER): Payer: Medicare Other

## 2019-09-17 ENCOUNTER — Other Ambulatory Visit: Payer: Self-pay

## 2019-09-17 DIAGNOSIS — E785 Hyperlipidemia, unspecified: Secondary | ICD-10-CM | POA: Diagnosis not present

## 2019-09-17 DIAGNOSIS — I10 Essential (primary) hypertension: Secondary | ICD-10-CM

## 2019-09-17 DIAGNOSIS — Z8249 Family history of ischemic heart disease and other diseases of the circulatory system: Secondary | ICD-10-CM

## 2019-09-17 LAB — COMPREHENSIVE METABOLIC PANEL
ALT: 21 U/L (ref 0–35)
AST: 20 U/L (ref 0–37)
Albumin: 4.3 g/dL (ref 3.5–5.2)
Alkaline Phosphatase: 63 U/L (ref 39–117)
BUN: 11 mg/dL (ref 6–23)
CO2: 29 mEq/L (ref 19–32)
Calcium: 9.3 mg/dL (ref 8.4–10.5)
Chloride: 101 mEq/L (ref 96–112)
Creatinine, Ser: 0.55 mg/dL (ref 0.40–1.20)
GFR: 107.78 mL/min (ref >60.00)
Glucose, Bld: 83 mg/dL (ref 70–99)
Potassium: 4.2 mEq/L (ref 3.5–5.1)
Sodium: 135 mEq/L (ref 135–145)
Total Bilirubin: 1.3 mg/dL — ABNORMAL HIGH (ref 0.2–1.2)
Total Protein: 6.9 g/dL (ref 6.0–8.3)

## 2019-09-17 LAB — CBC WITH DIFFERENTIAL/PLATELET
Basophils Absolute: 0.1 10*3/uL (ref 0.0–0.1)
Basophils Relative: 1.8 % (ref 0.0–3.0)
Eosinophils Absolute: 0.1 10*3/uL (ref 0.0–0.7)
Eosinophils Relative: 3.6 % (ref 0.0–5.0)
HCT: 37.7 % (ref 36.0–46.0)
Hemoglobin: 13 g/dL (ref 12.0–15.0)
Lymphocytes Relative: 35.1 % (ref 12.0–46.0)
Lymphs Abs: 1.3 10*3/uL (ref 0.7–4.0)
MCHC: 34.5 g/dL (ref 30.0–36.0)
MCV: 92.3 fl (ref 78.0–100.0)
Monocytes Absolute: 0.4 10*3/uL (ref 0.1–1.0)
Monocytes Relative: 11 % (ref 3.0–12.0)
Neutro Abs: 1.8 10*3/uL (ref 1.4–7.7)
Neutrophils Relative %: 48.5 % (ref 43.0–77.0)
Platelets: 267 10*3/uL (ref 150.0–400.0)
RBC: 4.08 Mil/uL (ref 3.87–5.11)
RDW: 13.7 % (ref 11.5–15.5)
WBC: 3.7 10*3/uL — ABNORMAL LOW (ref 4.0–10.5)

## 2019-09-17 LAB — LIPID PANEL
Cholesterol: 195 mg/dL (ref 0–200)
HDL: 54.6 mg/dL (ref >39.00)
LDL Cholesterol: 123 mg/dL — ABNORMAL HIGH (ref 0–99)
NonHDL: 140.68
Total CHOL/HDL Ratio: 4
Triglycerides: 88 mg/dL (ref 0.0–149.0)
VLDL: 17.6 mg/dL (ref 0.0–40.0)

## 2019-10-01 ENCOUNTER — Other Ambulatory Visit: Payer: Self-pay

## 2019-10-01 ENCOUNTER — Ambulatory Visit (INDEPENDENT_AMBULATORY_CARE_PROVIDER_SITE_OTHER)
Admission: RE | Admit: 2019-10-01 | Discharge: 2019-10-01 | Disposition: A | Discharge status: Home / Self Care | Payer: Self-pay | Source: Ambulatory Visit | Attending: Family Medicine | Admitting: Family Medicine

## 2019-10-01 DIAGNOSIS — E785 Hyperlipidemia, unspecified: Secondary | ICD-10-CM

## 2019-10-02 ENCOUNTER — Encounter: Payer: Self-pay | Admitting: Family Medicine

## 2019-10-16 DIAGNOSIS — H5000 Unspecified esotropia: Secondary | ICD-10-CM | POA: Diagnosis not present

## 2019-10-17 ENCOUNTER — Other Ambulatory Visit: Payer: Self-pay | Admitting: Ophthalmology

## 2019-10-17 DIAGNOSIS — H532 Diplopia: Secondary | ICD-10-CM

## 2019-10-24 ENCOUNTER — Encounter: Payer: Self-pay | Admitting: Family Medicine

## 2019-10-24 ENCOUNTER — Telehealth (INDEPENDENT_AMBULATORY_CARE_PROVIDER_SITE_OTHER): Payer: Medicare Other | Admitting: Family Medicine

## 2019-10-24 VITALS — Ht 64.0 in | Wt 146.0 lb

## 2019-10-24 DIAGNOSIS — R0981 Nasal congestion: Secondary | ICD-10-CM | POA: Diagnosis not present

## 2019-10-24 DIAGNOSIS — R05 Cough: Secondary | ICD-10-CM

## 2019-10-24 DIAGNOSIS — J3489 Other specified disorders of nose and nasal sinuses: Secondary | ICD-10-CM | POA: Diagnosis not present

## 2019-10-24 DIAGNOSIS — R059 Cough, unspecified: Secondary | ICD-10-CM

## 2019-10-24 DIAGNOSIS — J301 Allergic rhinitis due to pollen: Secondary | ICD-10-CM

## 2019-10-24 MED ORDER — AMOXICILLIN-POT CLAVULANATE 875-125 MG PO TABS: 1.0000 | ORAL_TABLET | Freq: Two times a day (BID) | ORAL | 0 refills | Status: AC

## 2019-10-24 NOTE — Progress Notes (Signed)
Phone [phone] Virtual visit via Video note   Subjective:  Chief complaint: Chief Complaint  Patient presents with  . virtual  . Cough  . Sore Throat    This visit type was conducted due to national recommendations for restrictions regarding the COVID-19 Pandemic (e.g. social distancing).  This format is felt to be most appropriate for this patient at this time balancing risks to patient and risks to population by having him in for in person visit.  No physical exam was performed (except for noted visual exam or audio findings with Telehealth visits).    Our team/I connected with Destiny Acosta at 11:00 AM EDT by a video enabled telemedicine application (doxy.me or caregility through epic) and verified that I am speaking with the correct person using two identifiers.  Location patient: [place] Location provider: [facility], office Persons participating in the virtual visit:  patient  Our team/I discussed the limitations of evaluation and management by telemedicine and the availability of in person appointments. In light of current covid-19 pandemic, patient also understands that we are trying to protect them by minimizing in office contact if at all possible.  The patient expressed consent for telemedicine visit and agreed to proceed. Patient understands insurance will be billed.   Past Medical History-  Patient Active Problem List   Diagnosis Date Noted  . Spinal stenosis of lumbar region 01/14/2016    Priority: Medium  . Glaucoma 04/16/2015    Priority: Medium  . Suprapubic discomfort 09/17/2014    Priority: Medium  . Erosive osteoarthritis of both hands 08/10/2011    Priority: Medium  . Mild aortic regurgitation 05/14/2010    Priority: Medium  . Hyperlipidemia 12/06/2006    Priority: Medium  . Hypertension 12/06/2006    Priority: Medium  . GERD (gastroesophageal reflux disease) 12/06/2006    Priority: Medium  . Palpitations 04/16/2015    Priority: Low  . Allergic  rhinitis 06/10/2014    Priority: Low  . Insomnia 06/10/2014    Priority: Low  . History of adenomatous polyp of colon 12/21/2006    Priority: Low  . Primary open-angle glaucoma 08/30/2016    Medications- reviewed and updated Current Outpatient Medications  Medication Sig Dispense Refill  . Calcium Carbonate-Vitamin D (CALTRATE 600+D) 600-400 MG-UNIT per tablet Take 1 tablet by mouth daily.      . dorzolamide-timolol (COSOPT) 22.3-6.8 MG/ML ophthalmic solution INTILL 1 DROP INTO BOTH EYES TWICE A DAY    . estradiol (ESTRACE) 0.1 MG/GM vaginal cream Place 1 g vaginally once a week.    . fexofenadine (ALLEGRA) 180 MG tablet Take 180 mg by mouth daily as needed.     . irbesartan (AVAPRO) 75 MG tablet TAKE 1 TABLET BY MOUTH EVERY DAY 90 tablet 1  . Multiple Vitamin (MULTIVITAMIN) capsule Take 1 capsule by mouth daily.      . naproxen sodium (ALEVE) 220 MG tablet Take 220 mg by mouth daily as needed.    Marland Kitchen omeprazole (PRILOSEC) 40 MG capsule Take 1 capsule (40 mg total) by mouth daily. 30 capsule 5  . tretinoin (RETIN-A) 0.1 % cream Apply topically at bedtime.      . triamcinolone cream (KENALOG) 0.1 % Apply to itchy & irritated skin of the body as needed. Do not apply to face, underarms or genital skin.    Marland Kitchen amoxicillin-clavulanate (AUGMENTIN) 875-125 MG tablet Take 1 tablet by mouth 2 (two) times daily for 7 days. 14 tablet 0   No current facility-administered medications for this visit.  Objective:  Ht 5\' 4"  (1.626 m)   Wt 146 lb (66.2 kg)   BMI 25.06 kg/m  self reported vitals Gen: NAD, resting comfortably other than intermittent cough during visit Lungs: nonlabored, normal respiratory rate  Skin: appears dry, no obvious rash     Assessment and Plan   Cough/sinus pressure/fatigue S: Husband had a cold last week- he was very sleepy for 2 days and then felt much better. She has had symptoms since the weekend- about 5-6 days. Congestion/cough without fever or chills. Low energy.  Very slightly burning sensation in her throat- with phlegm in the throat. Tylenol and allegra help some. Some sinus pressure- a lot of blowing nose coming out clear - occasional twinge of yellow. Mild sore throat in the beginning.   Ear pressure- states on and off issues since a prior visit with me. Seems to be worsening.   She has been vaccinated from covid- has had both vaccines..  She denies fever, loss of taste of smell. She has been taking alegra and tylenol.  Has not been covid tested A/P: 74 year old female with history of allergies currently on Allegra with cough, congestion, sinus pressure for 6 days that is not significantly improving though her fatigue has improved.  This could be viral sinusitis or viral URI.  We discussed signs of bacterial sinusitis being double sickening or symptoms over 10 days.  With her lack of improvement so far and the fact I am out of office next week we opted to send in a course of Augmentin-she is only going to take this if she has worsening symptoms or her symptoms fail to improve over the next several days.  If she fails to improve with Augmentin and Covid test is negative as below we could potentially have her in the office for recheck.  If she did have an ear infection be Augmentin would also cover ear infection/otitis media. -For allergy portion she can continue Allegra-could have a combination of allergies and viral URI  Patient with symptoms concerning for potential covid 19 but she is fully vaccinated.  Therefore: -information provided on testing scheduling "please text "COVID" to [phone], OR you can log on to [email or web address] to easily make an on-line appointment. "   - recommended patient watch closely for shortness of breath or confusion or worsening symptoms and if those occur he should contact us immediately  -recommended self isolation until negative test  at minimum .    Recommended follow up:  Future Appointments  Date Time Provider  [place]  03/17/2020  2:40 PM Marin Olp, MD [facility]    Lab/Order associations:   ICD-10-CM   1. Sinus pressure  J34.89   2. Nasal congestion  R09.81   3. Cough  R05   4. Seasonal allergic rhinitis due to pollen  J30.1     Meds ordered this encounter  Medications  . amoxicillin-clavulanate (AUGMENTIN) 875-125 MG tablet    Sig: Take 1 tablet by mouth 2 (two) times daily for 7 days.    Dispense:  14 tablet    Refill:  0   Return precautions advised.  Garret Reddish, MD

## 2019-11-09 ENCOUNTER — Encounter: Payer: Self-pay | Admitting: Family Medicine

## 2019-11-10 NOTE — Telephone Encounter (Signed)
Called patient she states that she did not take abx. She is still having cough that is green at times. No fever, loss of taste or smell. She will start abx instructed to drink lots of water. If any increase in symptoms over weekend will go to urgent care or ED. If no improvement with symptoms after abx she will call for follow up. Have reviewed red words for immediate treatment. Patient repeated all information back to me and had no further questions.

## 2019-11-17 ENCOUNTER — Other Ambulatory Visit: Payer: Medicare Other

## 2019-12-07 ENCOUNTER — Telehealth: Payer: Self-pay | Admitting: Family Medicine

## 2019-12-07 NOTE — Telephone Encounter (Signed)
Error

## 2019-12-13 NOTE — Progress Notes (Signed)
Phone [phone] In person visit   Subjective:   Destiny Acosta is a 74 y.o. year old very pleasant female patient who presents for/with See problem oriented charting Chief Complaint  Patient presents with  . consult   This visit occurred during the SARS-CoV-2 public health emergency.  Safety protocols were in place, including screening questions prior to the visit, additional usage of staff PPE, and extensive cleaning of exam room while observing appropriate contact time as indicated for disinfecting solutions.   Past Medical History-  Patient Active Problem List   Diagnosis Date Noted  . Spinal stenosis of lumbar region 01/14/2016    Priority: Medium  . Glaucoma 04/16/2015    Priority: Medium  . Suprapubic discomfort 09/17/2014    Priority: Medium  . Erosive osteoarthritis of both hands 08/10/2011    Priority: Medium  . Mild aortic regurgitation 05/14/2010    Priority: Medium  . Hyperlipidemia 12/06/2006    Priority: Medium  . Hypertension 12/06/2006    Priority: Medium  . GERD (gastroesophageal reflux disease) 12/06/2006    Priority: Medium  . Palpitations 04/16/2015    Priority: Low  . Allergic rhinitis 06/10/2014    Priority: Low  . Insomnia 06/10/2014    Priority: Low  . History of adenomatous polyp of colon 12/21/2006    Priority: Low  . Primary open-angle glaucoma 08/30/2016    Medications- reviewed and updated Current Outpatient Medications  Medication Sig Dispense Refill  . Calcium Carbonate-Vitamin D (CALTRATE 600+D) 600-400 MG-UNIT per tablet Take 1 tablet by mouth daily.      . dorzolamide-timolol (COSOPT) 22.3-6.8 MG/ML ophthalmic solution INTILL 1 DROP INTO BOTH EYES TWICE A DAY    . estradiol (ESTRACE) 0.1 MG/GM vaginal cream Place 1 g vaginally once a week.    . fexofenadine (ALLEGRA) 180 MG tablet Take 180 mg by mouth daily as needed.     . irbesartan (AVAPRO) 75 MG tablet TAKE 1 TABLET BY MOUTH EVERY DAY 90 tablet 1  . Multiple Vitamin  (MULTIVITAMIN) capsule Take 1 capsule by mouth daily.      . naproxen sodium (ALEVE) 220 MG tablet Take 220 mg by mouth daily as needed.    . tretinoin (RETIN-A) 0.1 % cream Apply topically at bedtime.      . triamcinolone cream (KENALOG) 0.1 % Apply to itchy & irritated skin of the body as needed. Do not apply to face, underarms or genital skin.    Marland Kitchen omeprazole (PRILOSEC) 40 MG capsule Take 1 capsule (40 mg total) by mouth daily. 30 capsule 5   No current facility-administered medications for this visit.     Objective:  BP 118/70   Pulse (!) 58   Temp 98.1 F (36.7 C)   Ht 5\' 4"  (1.626 m)   Wt 155 lb 3.2 oz (70.4 kg)   SpO2 98%   BMI 26.64 kg/m  Gen: NAD, resting comfortably CV: RRR  Lungs: CTAB no crackles, wheeze, rhonchi Abdomen: soft/nontender/nondistended/normal bowel sounds. No rebound or guarding.  Ext: trace edema Skin: warm, dry    Assessment and Plan   # GERD/cough S:Medication: Omeprazole 40 mg as needed with plan to transition to 20 mg over-the-counter -Patient with flareup of reflux in December 2020.  H. pylori negative at that time.   pt wants to know the next steps to treat heartburn and cough. She was trying not take Omeperazole due to it not being good for her kidneys. She did notice some swelling in her lower legs when she  was on the high dose of omeperazole.   She tried to be off for 2 weeks but then had worsening symptoms and had to take Sunday and has been taking since then. Reflux and cough better back on the medication but only on 20 mg.  A/P: improving control back on omeprazole . From avs "Continue omeprazole 20 mg over-the-counter for the next month  After that (September) I want you to try Pepcid 20 mg twice daily before meals for another month.  If this is effective I would just stay on this dose until our next visit. "  Consider GI referral at follow up if not improving or if cannot tolerate pepcid  #hypertension S: medication: Irbesartan 75 mg  (cough on lisinopril in the past) Home readings #s:  No recent checks BP Readings from Last 3 Encounters:  12/14/19 118/70  09/13/19 126/78  08/13/19 130/82  A/P: Stable. Continue current medications.   #hyperlipidemia-with family history of CAD in mother at 31 S: Medication:none  Coronary CT 10/01/2019 with score of 0  Lab Results  Component Value Date   CHOL 195 09/17/2019   HDL 54.60 09/17/2019   LDLCALC 123 (H) 09/17/2019   LDLDIRECT 119.0 06/01/2017   TRIG 88.0 09/17/2019   CHOLHDL 4 09/17/2019   A/P: we have opted with coronary CT score of 0 to remain off medicine.   Recommended follow up: keep November follow up  Future Appointments  Date Time Provider [place]  03/17/2020  2:40 PM Marin Olp, MD [facility]    Lab/Order associations:   ICD-10-CM   1. Gastroesophageal reflux disease, unspecified whether esophagitis present  K21.9   2. Essential hypertension  I10   3. Hyperlipidemia, unspecified hyperlipidemia type  E78.5    Return precautions advised.  Garret Reddish, MD

## 2019-12-13 NOTE — Patient Instructions (Addendum)
Continue omeprazole 20 mg over-the-counter for the next month  After that (September) I want you to try Pepcid 20 mg twice daily before meals for another month.  If this is effective I would just stay on this dose until our next visit.

## 2019-12-14 ENCOUNTER — Other Ambulatory Visit: Payer: Self-pay

## 2019-12-14 ENCOUNTER — Ambulatory Visit (INDEPENDENT_AMBULATORY_CARE_PROVIDER_SITE_OTHER): Payer: Medicare Other | Admitting: Family Medicine

## 2019-12-14 ENCOUNTER — Encounter: Payer: Self-pay | Admitting: Family Medicine

## 2019-12-14 VITALS — BP 118/70 | HR 58 | Temp 98.1°F | Ht 64.0 in | Wt 155.2 lb

## 2019-12-14 DIAGNOSIS — I10 Essential (primary) hypertension: Secondary | ICD-10-CM

## 2019-12-14 DIAGNOSIS — K219 Gastro-esophageal reflux disease without esophagitis: Secondary | ICD-10-CM | POA: Diagnosis not present

## 2019-12-14 DIAGNOSIS — E785 Hyperlipidemia, unspecified: Secondary | ICD-10-CM

## 2019-12-21 DIAGNOSIS — H5000 Unspecified esotropia: Secondary | ICD-10-CM | POA: Diagnosis not present

## 2020-01-04 DIAGNOSIS — Z23 Encounter for immunization: Secondary | ICD-10-CM | POA: Diagnosis not present

## 2020-01-04 DIAGNOSIS — H5000 Unspecified esotropia: Secondary | ICD-10-CM | POA: Diagnosis not present

## 2020-02-07 DIAGNOSIS — Z23 Encounter for immunization: Secondary | ICD-10-CM | POA: Diagnosis not present

## 2020-02-11 DIAGNOSIS — H5000 Unspecified esotropia: Secondary | ICD-10-CM | POA: Diagnosis not present

## 2020-02-19 ENCOUNTER — Other Ambulatory Visit: Payer: Self-pay | Admitting: Family Medicine

## 2020-02-28 DIAGNOSIS — Z124 Encounter for screening for malignant neoplasm of cervix: Secondary | ICD-10-CM | POA: Diagnosis not present

## 2020-02-28 LAB — HM PAP SMEAR: HM Pap smear: NORMAL

## 2020-03-10 ENCOUNTER — Other Ambulatory Visit: Payer: Self-pay | Admitting: Obstetrics and Gynecology

## 2020-03-10 DIAGNOSIS — Z1231 Encounter for screening mammogram for malignant neoplasm of breast: Secondary | ICD-10-CM

## 2020-03-13 NOTE — Progress Notes (Deleted)
Phone [phone] In person visit   Subjective:   Destiny Acosta is a 74 y.o. year old very pleasant female patient who presents for/with See problem oriented charting No chief complaint on file.   This visit occurred during the SARS-CoV-2 public health emergency.  Safety protocols were in place, including screening questions prior to the visit, additional usage of staff PPE, and extensive cleaning of exam room while observing appropriate contact time as indicated for disinfecting solutions.   Past Medical History-  Patient Active Problem List   Diagnosis Date Noted  . Primary open-angle glaucoma 08/30/2016  . Spinal stenosis of lumbar region 01/14/2016  . Glaucoma 04/16/2015  . Palpitations 04/16/2015  . Suprapubic discomfort 09/17/2014  . Allergic rhinitis 06/10/2014  . Insomnia 06/10/2014  . Erosive osteoarthritis of both hands 08/10/2011  . Mild aortic regurgitation 05/14/2010  . History of adenomatous polyp of colon 12/21/2006  . Hyperlipidemia 12/06/2006  . Hypertension 12/06/2006  . GERD (gastroesophageal reflux disease) 12/06/2006    Medications- reviewed and updated Current Outpatient Medications  Medication Sig Dispense Refill  . Calcium Carbonate-Vitamin D (CALTRATE 600+D) 600-400 MG-UNIT per tablet Take 1 tablet by mouth daily.      . dorzolamide-timolol (COSOPT) 22.3-6.8 MG/ML ophthalmic solution INTILL 1 DROP INTO BOTH EYES TWICE A DAY    . estradiol (ESTRACE) 0.1 MG/GM vaginal cream Place 1 g vaginally once a week.    . fexofenadine (ALLEGRA) 180 MG tablet Take 180 mg by mouth daily as needed.     . irbesartan (AVAPRO) 75 MG tablet TAKE 1 TABLET BY MOUTH EVERY DAY 90 tablet 1  . Multiple Vitamin (MULTIVITAMIN) capsule Take 1 capsule by mouth daily.      . naproxen sodium (ALEVE) 220 MG tablet Take 220 mg by mouth daily as needed.    Marland Kitchen omeprazole (PRILOSEC) 40 MG capsule Take 1 capsule (40 mg total) by mouth daily. 30 capsule 5  . tretinoin (RETIN-A) 0.1 %  cream Apply topically at bedtime.      . triamcinolone cream (KENALOG) 0.1 % Apply to itchy & irritated skin of the body as needed. Do not apply to face, underarms or genital skin.     No current facility-administered medications for this visit.     Objective:  There were no vitals taken for this visit. Gen: NAD, resting comfortably CV: RRR no murmurs rubs or gallops Lungs: CTAB no crackles, wheeze, rhonchi Abdomen: soft/nontender/nondistended/normal bowel sounds. No rebound or guarding.  Ext: no edema Skin: warm, dry Neuro: grossly normal, moves all extremities  ***    Assessment and Plan  *** 02/01/2019 awv ***  ***moving into new home 01/2020. a lot of stress due to pandemic and shortages  #hypertension S: medication: ***Irbesartan 75 mg (cough on lisinopril in the past) Home readings #s: *** A/P: ***   #hyperlipidemia-with family history of CAD in mother at 21 S: Medication:***  Coronary CT 10/01/2019 with score of 0***   A/P: ***   # GERD S: Medication: Omeprazole 40 mg as needed with plan to transition to 20 mg over-the-counter -Patient with flareup of reflux in December 2020.  H. pylori negative at that time.***  A/P: ***   % Mild aortic regurgitation on echocardiogram 2016-murmur noted***  % Glaucoma-follows with ophthalmology***    % Erosive osteoarthritis of both hands-follows at [facility].  They state not osteoarthritis.  Dr. Amil Amen in the past had thought psoriatic arthritis.  Had also been followed by Dr. Ouida Sills of rheumatology  #History of adenomatous colon  polyp-Serrated adenoma October 2017 with plan for 5-year follow-up  ***bilirubin hair high intermittently  No problem-specific Assessment & Plan notes found for this encounter.   Recommended follow up: ***No follow-ups on file. Future Appointments  Date Time Provider [place]  03/17/2020  2:40 PM Marin Olp, MD [facility]  03/24/2020 11:40 AM [facility]     Lab/Order associations: No diagnosis found.  No orders of the defined types were placed in this encounter.   Time Spent: *** minutes of total time (4:06 PM***- 4:06 PM***) was spent on the date of the encounter performing the following actions: chart review prior to seeing the patient, obtaining history, performing a medically necessary exam, counseling on the treatment plan, placing orders, and documenting in our EHR.   Return precautions advised.  Clyde Lundborg, CMA

## 2020-03-14 DIAGNOSIS — Z85828 Personal history of other malignant neoplasm of skin: Secondary | ICD-10-CM | POA: Diagnosis not present

## 2020-03-14 DIAGNOSIS — Z1283 Encounter for screening for malignant neoplasm of skin: Secondary | ICD-10-CM | POA: Diagnosis not present

## 2020-03-14 DIAGNOSIS — I8393 Asymptomatic varicose veins of bilateral lower extremities: Secondary | ICD-10-CM | POA: Diagnosis not present

## 2020-03-14 DIAGNOSIS — L814 Other melanin hyperpigmentation: Secondary | ICD-10-CM | POA: Diagnosis not present

## 2020-03-14 DIAGNOSIS — L658 Other specified nonscarring hair loss: Secondary | ICD-10-CM | POA: Diagnosis not present

## 2020-03-17 ENCOUNTER — Ambulatory Visit: Payer: Medicare Other | Admitting: Family Medicine

## 2020-03-17 ENCOUNTER — Ambulatory Visit (INDEPENDENT_AMBULATORY_CARE_PROVIDER_SITE_OTHER): Payer: Medicare Other | Admitting: Family Medicine

## 2020-03-17 ENCOUNTER — Encounter: Payer: Self-pay | Admitting: Family Medicine

## 2020-03-17 ENCOUNTER — Other Ambulatory Visit: Payer: Self-pay

## 2020-03-17 VITALS — BP 122/70 | HR 67 | Temp 98.1°F | Resp 18 | Ht 64.0 in | Wt 153.0 lb

## 2020-03-17 DIAGNOSIS — K219 Gastro-esophageal reflux disease without esophagitis: Secondary | ICD-10-CM | POA: Diagnosis not present

## 2020-03-17 DIAGNOSIS — E785 Hyperlipidemia, unspecified: Secondary | ICD-10-CM | POA: Diagnosis not present

## 2020-03-17 DIAGNOSIS — I10 Essential (primary) hypertension: Secondary | ICD-10-CM

## 2020-03-17 MED ORDER — IRBESARTAN 75 MG PO TABS: 75.0000 mg | ORAL_TABLET | Freq: Every day | ORAL | 3 refills | Status: DC

## 2020-03-17 NOTE — Patient Instructions (Addendum)
You can also schedule a follow up wellness visit with our nurse Otila Kluver- by phone or virtual is reasonable.    Glad the pepcid is working- lets continue this.

## 2020-03-17 NOTE — Progress Notes (Signed)
Phone [phone] In person visit   Subjective:   Destiny Acosta is a 74 y.o. year old very pleasant female patient who presents for/with See problem oriented charting Chief Complaint  Patient presents with  . Hyperlipidemia  . Hypertension   This visit occurred during the SARS-CoV-2 public health emergency.  Safety protocols were in place, including screening questions prior to the visit, additional usage of staff PPE, and extensive cleaning of exam room while observing appropriate contact time as indicated for disinfecting solutions.   Past Medical History-  Patient Active Problem List   Diagnosis Date Noted  . Spinal stenosis of lumbar region 01/14/2016    Priority: Medium  . Glaucoma 04/16/2015    Priority: Medium  . Suprapubic discomfort 09/17/2014    Priority: Medium  . Erosive osteoarthritis of both hands 08/10/2011    Priority: Medium  . Mild aortic regurgitation 05/14/2010    Priority: Medium  . Hyperlipidemia 12/06/2006    Priority: Medium  . Hypertension 12/06/2006    Priority: Medium  . GERD (gastroesophageal reflux disease) 12/06/2006    Priority: Medium  . Palpitations 04/16/2015    Priority: Low  . Allergic rhinitis 06/10/2014    Priority: Low  . Insomnia 06/10/2014    Priority: Low  . History of adenomatous polyp of colon 12/21/2006    Priority: Low  . Primary open-angle glaucoma 08/30/2016    Medications- reviewed and updated Current Outpatient Medications  Medication Sig Dispense Refill  . Calcium Carbonate-Vitamin D (CALTRATE 600+D) 600-400 MG-UNIT per tablet Take 1 tablet by mouth daily.      . dorzolamide-timolol (COSOPT) 22.3-6.8 MG/ML ophthalmic solution INTILL 1 DROP INTO BOTH EYES TWICE A DAY    . estradiol (ESTRACE) 0.1 MG/GM vaginal cream Place 1 g vaginally once a week.    . famotidine (PEPCID) 10 MG tablet Take 10 mg by mouth 2 (two) times daily.    . fexofenadine (ALLEGRA) 180 MG tablet Take 180 mg by mouth daily as needed.     .  irbesartan (AVAPRO) 75 MG tablet Take 1 tablet (75 mg total) by mouth daily. 90 tablet 3  . latanoprost (XALATAN) 0.005 % ophthalmic solution Place 1 drop into both eyes at bedtime.    . Multiple Vitamin (MULTIVITAMIN) capsule Take 1 capsule by mouth daily.      . naproxen sodium (ALEVE) 220 MG tablet Take 220 mg by mouth daily as needed.    . tretinoin (RETIN-A) 0.1 % cream Apply topically at bedtime.      . triamcinolone cream (KENALOG) 0.1 % Apply to itchy & irritated skin of the body as needed. Do not apply to face, underarms or genital skin.     No current facility-administered medications for this visit.     Objective:  BP 122/70   Pulse 67   Temp 98.1 F (36.7 C) (Temporal)   Resp 18   Ht 5\' 4"  (1.626 m)   Wt 153 lb (69.4 kg)   SpO2 99%   BMI 26.26 kg/m  Gen: NAD, resting comfortably CV: RRR no murmurs rubs or gallops Lungs: CTAB no crackles, wheeze, rhonchi Ext: no edema Skin: warm, dry    Assessment and Plan    #hypertension S: medication: Irbesartan 75 mg (cough on lisinopril in the past) A/P: Stable. Continue current medications.    #hyperlipidemia-with family history of CAD in mother at 92 S: Medication: none  Coronary CT 10/01/2019 with score of 0   A/P: mild poor control but reassuring coronary calcium score-  continue with medication.    # GERD/associated with cough S: Medication: Omeprazole 40 mg as needed with plan to transition to 20 mg over-the-counter. At last visit patient was doing reasonably well on 20 mg omeprazole over-the-counter. The next step in the plan was to transition to Pepcid 20 mg twice daily before meals for another month and then stop medication if possible. When she moved into her new home started forgetting evening dose. Also started slipping up on.  -she reports overall symptoms much improved with pepcid if takes consistently. Can have some flares if misses something or has something really spicy- will have reflux and cough. Prior issues  with swallowing much better- exceedingly rare with poor food choices -Can also consider referral to GI if not improving A/P: reasonable control with pepcid 10mg  twice daily- continue current medication.    Recommended follow up: Return in about 6 months (around 09/14/2020) for follow up- or sooner if needed. Future Appointments  Date Time Provider [place]  03/24/2020 11:40 AM [facility]   Lab/Order associations:   ICD-10-CM   1. Gastroesophageal reflux disease, unspecified whether esophagitis present  K21.9   2. Primary hypertension  I10   3. Hyperlipidemia, unspecified hyperlipidemia type  E78.5    Meds ordered this encounter  Medications  . irbesartan (AVAPRO) 75 MG tablet    Sig: Take 1 tablet (75 mg total) by mouth daily.    Dispense:  90 tablet    Refill:  3    Return precautions advised.  Garret Reddish, MD

## 2020-03-18 ENCOUNTER — Other Ambulatory Visit: Payer: Self-pay | Admitting: Obstetrics and Gynecology

## 2020-03-18 DIAGNOSIS — E2839 Other primary ovarian failure: Secondary | ICD-10-CM

## 2020-03-24 ENCOUNTER — Other Ambulatory Visit: Payer: Self-pay

## 2020-03-24 ENCOUNTER — Ambulatory Visit
Admission: RE | Admit: 2020-03-24 | Discharge: 2020-03-24 | Disposition: A | Discharge status: Home / Self Care | Payer: Medicare Other | Source: Ambulatory Visit | Attending: Obstetrics and Gynecology | Admitting: Obstetrics and Gynecology

## 2020-03-24 DIAGNOSIS — Z1231 Encounter for screening mammogram for malignant neoplasm of breast: Secondary | ICD-10-CM

## 2020-03-25 ENCOUNTER — Other Ambulatory Visit: Payer: Self-pay | Admitting: Obstetrics and Gynecology

## 2020-03-25 DIAGNOSIS — N6310 Unspecified lump in the right breast, unspecified quadrant: Secondary | ICD-10-CM

## 2020-04-29 ENCOUNTER — Other Ambulatory Visit: Payer: Medicare Other

## 2020-05-10 LAB — HM COLONOSCOPY

## 2020-05-22 ENCOUNTER — Other Ambulatory Visit: Payer: Self-pay

## 2020-05-22 ENCOUNTER — Ambulatory Visit (INDEPENDENT_AMBULATORY_CARE_PROVIDER_SITE_OTHER): Payer: Medicare Other

## 2020-05-22 DIAGNOSIS — Z0000 Encounter for general adult medical examination without abnormal findings: Secondary | ICD-10-CM | POA: Diagnosis not present

## 2020-05-22 NOTE — Progress Notes (Addendum)
Virtual Visit via Telephone Note  I connected with  Destiny Acosta on 05/22/20 at  8:45 AM EST by telephone and verified that I am speaking with the correct person using two identifiers.  Medicare Annual Wellness visit completed telephonically due to Covid-19 pandemic.   Persons participating in this call: This Health Coach and this patient.   Location: Patient: Home Provider: Office   I discussed the limitations, risks, security and privacy concerns of performing an evaluation and management service by telephone and the availability of in person appointments. The patient expressed understanding and agreed to proceed.  Unable to perform video visit due to video visit attempted and failed and/or patient does not have video capability.   Some vital signs may be absent or patient reported.   Willette Brace, LPN    Subjective:   Destiny Acosta is a 75 y.o. female who presents for Medicare Annual (Subsequent) preventive examination.  Review of Systems     Cardiac Risk Factors include: advanced age (>22men, >59 women);dyslipidemia;hypertension     Objective:    There were no vitals filed for this visit. There is no height or weight on file to calculate BMI.  Advanced Directives 05/22/2020 02/01/2019 01/03/2018 03/01/2016 02/18/2016 09/24/2015  Does Patient Have a Medical Advance Directive? Yes Yes Yes Yes Yes Yes  Type of Paramedic of [place];Living will Living will [place];Living will Living will;Healthcare Power of [place];Living will -  Does patient want to make changes to medical advance directive? - No - Patient declined No - Patient declined - - -  Copy of [place] in Chart? No - copy requested - No - copy requested - - No - copy requested    Current Medications (verified) Outpatient Encounter Medications as of 05/22/2020  Medication Sig  . Calcium Carbonate-Vitamin D 600-400  MG-UNIT tablet Take 1 tablet by mouth daily.  . dorzolamide-timolol (COSOPT) 22.3-6.8 MG/ML ophthalmic solution INTILL 1 DROP INTO BOTH EYES TWICE A DAY  . estradiol (ESTRACE) 0.1 MG/GM vaginal cream Place 1 g vaginally once a week.  . famotidine (PEPCID) 10 MG tablet Take 10 mg by mouth 2 (two) times daily.  . fexofenadine (ALLEGRA) 180 MG tablet Take 180 mg by mouth daily as needed.  . irbesartan (AVAPRO) 75 MG tablet Take 1 tablet (75 mg total) by mouth daily.  Marland Kitchen latanoprost (XALATAN) 0.005 % ophthalmic solution Place 1 drop into both eyes at bedtime.  . Multiple Vitamin (MULTIVITAMIN) capsule Take 1 capsule by mouth daily.  Marland Kitchen tretinoin (RETIN-A) 0.1 % cream Apply topically at bedtime.  . [DISCONTINUED] naproxen sodium (ALEVE) 220 MG tablet Take 220 mg by mouth daily as needed. (Patient not taking: Reported on 05/22/2020)  . [DISCONTINUED] triamcinolone cream (KENALOG) 0.1 % Apply to itchy & irritated skin of the body as needed. Do not apply to face, underarms or genital skin. (Patient not taking: Reported on 05/22/2020)   No facility-administered encounter medications on file as of 05/22/2020.    Allergies (verified) Prednisolone, Tizanidine, Ace inhibitors, Ciprofloxacin, Codeine phosphate, Kiwi extract, and Latex   History: Past Medical History:  Diagnosis Date  . Allergy    Eggplant/KIWI  . Aortal stenosis 05/14/2010  . Arthritis   . Cancer ([place]) 11/2015   skin cancer forehead; MOHS procedure 11/2015  . GERD 12/06/2006  . Heart murmur    aortic valve  . HYPERLIPIDEMIA 12/06/2006  . Hyperplastic colon polyp   . HYPERTENSION 12/06/2006  .  MITRAL VALVE PROLAPSE 12/06/2006  . Osteopenia   . Spinal stenosis    Past Surgical History:  Procedure Laterality Date  . BREAST CYST ASPIRATION    . CATARACT EXTRACTION Right 03/07/2019  . TUBAL LIGATION     Family History  Problem Relation Age of Onset  . Colon cancer Father 9483  . Atrial fibrillation Father   . Diabetes Mother   . Heart  failure Mother   . Heart disease Mother [id number]       CABG, quit 20 years prior o bypass  . Diabetes Maternal Grandmother   . Arthritis Sister   . Rheum arthritis Paternal Grandmother   . Lung disease Paternal Grandfather    Social History   Socioeconomic History  . Marital status: Married    Spouse name: Not on file  . Number of children: Not on file  . Years of education: Not on file  . Highest education level: Not on file  Occupational History  . Not on file  Tobacco Use  . Smoking status: Never Smoker  . Smokeless tobacco: Never Used  Vaping Use  . Vaping Use: Never used  Substance and Sexual Activity  . Alcohol use: Yes    Comment: rarely may have 1 wine a month or less  . Drug use: No  . Sexual activity: Not on file  Other Topics Concern  . Not on file  Social History Narrative   Married 1967. 1 son, lives in [place]. 1 grandson in [place] age 76.       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       Hobbies: family time, walking 2.5-3 miles daily, moviews    Social Determinants of Health   Financial Resource Strain: Low Risk   . Difficulty of Paying Living Expenses: Not hard at all  Food Insecurity: No Food Insecurity  . Worried About Programme researcher, broadcasting/film/videounning Out of Food in the Last Year: Never true  . Ran Out of Food in the Last Year: Never true  Transportation Needs: No Transportation Needs  . Lack of Transportation (Medical): No  . Lack of Transportation (Non-Medical): No  Physical Activity: Inactive  . Days of Exercise per Week: 0 days  . Minutes of Exercise per Session: 0 min  Stress: No Stress Concern Present  . Feeling of Stress : Not at all  Social Connections: Moderately Isolated  . Frequency of Communication with Friends and Family: More than three times a week  . Frequency of Social Gatherings with Friends and Family: Twice a week  . Attends Religious Services: Never  . Active Member of Clubs or Organizations: No  . Attends Tax inspectorClub or  Organization Meetings: Never  . Marital Status: Married    Tobacco Counseling Counseling given: Not Answered   Clinical Intake:  Pre-visit preparation completed: Yes  Pain : No/denies pain     BMI - recorded: 26.26 Nutritional Status: BMI 25 -29 Overweight Nutritional Risks: None Diabetes: No  How often do you need to have someone help you when you read instructions, pamphlets, or other written materials from your doctor or pharmacy?: 1 - Never  Diabetic?no  Interpreter Needed?: No  Information entered by :: Lanier Ensignina Yianni Skilling, LPN   Activities of Daily Living In your present state of health, do you have any difficulty performing the following activities: 05/22/2020 03/17/2020  Hearing? Y N  Comment mild loss -  Vision? N N  Difficulty concentrating or making decisions? Y N  Comment memory at times -  Walking  or climbing stairs? N N  Dressing or bathing? N N  Doing errands, shopping? N N  Preparing Food and eating ? N -  Using the Toilet? N -  In the past six months, have you accidently leaked urine? N -  Do you have problems with loss of bowel control? N -  Managing your Medications? N -  Managing your Finances? N -  Housekeeping or managing your Housekeeping? N -  Some recent data might be hidden    Patient Care Team: Marin Olp, MD as PCP - General (Family Medicine) Terrall Laity, MD as Consulting Physician (Dermatology) Lucy Antigua, MD as Consulting Physician (Rheumatology) Jola Schmidt, MD as Consulting Physician (Ophthalmology)  Indicate any recent Medical Services you may have received from other than [facility] providers in the past year (date may be approximate).     Assessment:   This is a routine wellness examination for [place].  Hearing/Vision screen  Hearing Screening   125Hz  250Hz  500Hz  1000Hz  2000Hz  3000Hz  4000Hz  6000Hz  8000Hz   Right ear:           Left ear:           Comments: Pt states mild loss  Vision Screening  Comments: Pt follows up with Dr Jola Schmidt for annual eye exams  Dietary issues and exercise activities discussed: Current Exercise Habits: The patient does not participate in regular exercise at present  Goals    . Patient Stated     Reduce acid reflux symptoms      . Patient Stated     Get back to exercise       Depression Screen PHQ 2/9 Scores 05/22/2020 12/14/2019 10/24/2019 09/13/2019 02/01/2019 02/01/2019 08/11/2018  PHQ - 2 Score 0 0 0 0 1 1 0  PHQ- 9 Score - 2 0 0 - - -    Fall Risk Fall Risk  05/22/2020 03/17/2020 02/01/2019 02/01/2019 01/03/2018  Falls in the past year? 0 0 0 0 No  Number falls in past yr: 0 0 0 0 -  Injury with Fall? 0 0 0 0 -  Risk for fall due to : Impaired vision - - - -  Follow up Falls prevention discussed - Education provided;Falls prevention discussed;Falls evaluation completed - -    FALL RISK PREVENTION PERTAINING TO THE HOME:  Any stairs in or around the home? Yes  If so, are there any without handrails? No  Home free of loose throw rugs in walkways, pet beds, electrical cords, etc? Yes  Adequate lighting in your home to reduce risk of falls? Yes   ASSISTIVE DEVICES UTILIZED TO PREVENT FALLS:  Life alert? No  Use of a cane, walker or w/c? No  Grab bars in the bathroom? No  Shower chair or bench in shower? Yes Elevated toilet seat or a handicapped toilet? Yes   TIMED UP AND GO:  Was the test performed? No .     Cognitive Function:     6CIT Screen 05/22/2020 02/01/2019 01/03/2018  What Year? 0 points 0 points 0 points  What month? 0 points 0 points 0 points  What time? - 0 points 0 points  Count back from 20 0 points 0 points 0 points  Months in reverse 0 points 0 points 0 points  Repeat phrase 0 points 0 points -  Total Score - 0 -    Immunizations Immunization History  Administered Date(s) Administered  . Fluad Quad(high Dose 65+) 12/27/2018, 01/18/2020  . Influenza Split 02/08/2011, 02/08/2012  .  Influenza Whole 03/02/2007,  02/20/2008  . Influenza, High Dose Seasonal PF 01/14/2016, 02/02/2017, 01/13/2018  . Influenza,inj,Quad PF,6+ Mos 02/09/2013, 01/18/2014, 02/06/2015, 02/07/2017  . Influenza-Unspecified 02/27/2004  . PFIZER SARS-COV-2 Vaccination 05/27/2019, 06/17/2019, 01/04/2020  . Pneumococcal Conjugate-13 06/10/2014  . Pneumococcal Polysaccharide-23 11/17/2007, 02/08/2011  . Td 05/10/2001  . Tdap 04/18/2013  . Zoster 12/20/2007  . Zoster Recombinat (Shingrix) 02/16/2019, 04/20/2019    TDAP status: Up to date  Flu Vaccine status: Up to date  Pneumococcal vaccine status: Up to date  Covid-19 vaccine status: Completed vaccines  Qualifies for Shingles Vaccine? Yes   Zostavax completed Yes   Shingrix Completed?: Yes  Screening Tests Health Maintenance  Topic Date Due  . COVID-19 Vaccine (4 - Booster for Pfizer series) 07/06/2020  . COLONOSCOPY (Pts 45-42yrs Insurance coverage will need to be confirmed)  03/01/2021  . TETANUS/TDAP  04/19/2023  . INFLUENZA VACCINE  Completed  . DEXA SCAN  Completed  . Hepatitis C Screening  Completed  . PNA vac Low Risk Adult  Completed    Health Maintenance  There are no preventive care reminders to display for this patient.  Colorectal cancer screening: Type of screening: Colonoscopy. Completed 03/01/16. Repeat every 5 years  Mammogram status: Completed 03/12/19. Repeat every year  Bone Density status: Completed 02/16/18. Results reflect: Bone density results: OSTEOPENIA. Repeat every 2 years.    Additional Screening:  Hepatitis C Screening:  Completed 05/09/15  Vision Screening: Recommended annual ophthalmology exams for early detection of glaucoma and other disorders of the eye. Is the patient up to date with their annual eye exam?  Yes  Who is the provider or what is the name of the office in which the patient attends annual eye exams? Dr Jola Schmidt   Dental Screening: Recommended annual dental exams for proper oral hygiene  Community  Resource Referral / Chronic Care Management: CRR required this visit?  No   CCM required this visit?  No      Plan:     I have personally reviewed and noted the following in the patient's chart:   . Medical and social history . Use of alcohol, tobacco or illicit drugs  . Current medications and supplements . Functional ability and status . Nutritional status . Physical activity . Advanced directives . List of other physicians . Hospitalizations, surgeries, and ER visits in previous 12 months . Vitals . Screenings to include cognitive, depression, and falls . Referrals and appointments  In addition, I have reviewed and discussed with patient certain preventive protocols, quality metrics, and best practice recommendations. A written personalized care plan for preventive services as well as general preventive health recommendations were provided to patient.     Willette Brace, LPN   1/66/0630   Nurse Notes: Pt and husband feel they have covid symptoms and have been looking for a testing center. I did give pt a number to call for an appt.& Advised pt to be safe and quarantine until she can be tested. Pt stated they no longer have fever and was feeling better.

## 2020-05-22 NOTE — Patient Instructions (Addendum)
Destiny Acosta , Thank you for taking time to come for your Medicare Wellness Visit. I appreciate your ongoing commitment to your health goals. Please review the following plan we discussed and let me know if I can assist you in the future.   Screening recommendations/referrals: Colonoscopy: Done 03/01/16 Mammogram: Done 03/12/19 Bone Density: Done 02/16/18 Recommended yearly ophthalmology/optometry visit for glaucoma screening and checkup Recommended yearly dental visit for hygiene and checkup  Vaccinations: Influenza vaccine: Done 01/18/20 Up to date Pneumococcal vaccine: Up to date Tdap vaccine: Up to date Shingles vaccine: Completed 02/16/19 & 04/20/19   Covid-19:Completed 05/27/19, 06/17/19, & 12/29/19  Advanced directives: Please bring a copy of your health care power of attorney and living will to the office at your convenience.  Conditions/risks identified: Get back to exercise   Next appointment: Follow up in one year for your annual wellness visit    Preventive Care 65 Years and Older, Female Preventive care refers to lifestyle choices and visits with your health care provider that can promote health and wellness. What does preventive care include?  A yearly physical exam. This is also called an annual well check.  Dental exams once or twice a year.  Routine eye exams. Ask your health care provider how often you should have your eyes checked.  Personal lifestyle choices, including:  Daily care of your teeth and gums.  Regular physical activity.  Eating a healthy diet.  Avoiding tobacco and drug use.  Limiting alcohol use.  Practicing safe sex.  Taking low-dose aspirin every day.  Taking vitamin and mineral supplements as recommended by your health care provider. What happens during an annual well check? The services and screenings done by your health care provider during your annual well check will depend on your age, overall health, lifestyle risk factors, and  family history of disease. Counseling  Your health care provider may ask you questions about your:  Alcohol use.  Tobacco use.  Drug use.  Emotional well-being.  Home and relationship well-being.  Sexual activity.  Eating habits.  History of falls.  Memory and ability to understand (cognition).  Work and work Statistician.  Reproductive health. Screening  You may have the following tests or measurements:  Height, weight, and BMI.  Blood pressure.  Lipid and cholesterol levels. These may be checked every 5 years, or more frequently if you are over 38 years old.  Skin check.  Lung cancer screening. You may have this screening every year starting at age 76 if you have a 30-pack-year history of smoking and currently smoke or have quit within the past 15 years.  Fecal occult blood test (FOBT) of the stool. You may have this test every year starting at age 76.  Flexible sigmoidoscopy or colonoscopy. You may have a sigmoidoscopy every 5 years or a colonoscopy every 10 years starting at age 76.  Hepatitis C blood test.  Hepatitis B blood test.  Sexually transmitted disease (STD) testing.  Diabetes screening. This is done by checking your blood sugar (glucose) after you have not eaten for a while (fasting). You may have this done every 1-3 years.  Bone density scan. This is done to screen for osteoporosis. You may have this done starting at age 76.  Mammogram. This may be done every 1-2 years. Talk to your health care provider about how often you should have regular mammograms. Talk with your health care provider about your test results, treatment options, and if necessary, the need for more tests. Vaccines  Your  health care provider may recommend certain vaccines, such as:  Influenza vaccine. This is recommended every year.  Tetanus, diphtheria, and acellular pertussis (Tdap, Td) vaccine. You may need a Td booster every 10 years.  Zoster vaccine. You may need this  after age 76.  Pneumococcal 13-valent conjugate (PCV13) vaccine. One dose is recommended after age 76.  Pneumococcal polysaccharide (PPSV23) vaccine. One dose is recommended after age 76. Talk to your health care provider about which screenings and vaccines you need and how often you need them. This information is not intended to replace advice given to you by your health care provider. Make sure you discuss any questions you have with your health care provider. Document Released: 05/23/2015 Document Revised: 01/14/2016 Document Reviewed: 02/25/2015 Elsevier Interactive Patient Education  2017 [place] Prevention in the Home Falls can cause injuries. They can happen to people of all ages. There are many things you can do to make your home safe and to help prevent falls. What can I do on the outside of my home?  Regularly fix the edges of walkways and driveways and fix any cracks.  Remove anything that might make you trip as you walk through a door, such as a raised step or threshold.  Trim any bushes or trees on the path to your home.  Use bright outdoor lighting.  Clear any walking paths of anything that might make someone trip, such as rocks or tools.  Regularly check to see if handrails are loose or broken. Make sure that both sides of any steps have handrails.  Any raised decks and porches should have guardrails on the edges.  Have any leaves, snow, or ice cleared regularly.  Use sand or salt on walking paths during winter.  Clean up any spills in your garage right away. This includes oil or grease spills. What can I do in the bathroom?  Use night lights.  Install grab bars by the toilet and in the tub and shower. Do not use towel bars as grab bars.  Use non-skid mats or decals in the tub or shower.  If you need to sit down in the shower, use a plastic, non-slip stool.  Keep the floor dry. Clean up any water that spills on the floor as soon as it  happens.  Remove soap buildup in the tub or shower regularly.  Attach bath mats securely with double-sided non-slip rug tape.  Do not have throw rugs and other things on the floor that can make you trip. What can I do in the bedroom?  Use night lights.  Make sure that you have a light by your bed that is easy to reach.  Do not use any sheets or blankets that are too big for your bed. They should not hang down onto the floor.  Have a firm chair that has side arms. You can use this for support while you get dressed.  Do not have throw rugs and other things on the floor that can make you trip. What can I do in the kitchen?  Clean up any spills right away.  Avoid walking on wet floors.  Keep items that you use a lot in easy-to-reach places.  If you need to reach something above you, use a strong step stool that has a grab bar.  Keep electrical cords out of the way.  Do not use floor polish or wax that makes floors slippery. If you must use wax, use non-skid floor wax.  Do not  have throw rugs and other things on the floor that can make you trip. What can I do with my stairs?  Do not leave any items on the stairs.  Make sure that there are handrails on both sides of the stairs and use them. Fix handrails that are broken or loose. Make sure that handrails are as long as the stairways.  Check any carpeting to make sure that it is firmly attached to the stairs. Fix any carpet that is loose or worn.  Avoid having throw rugs at the top or bottom of the stairs. If you do have throw rugs, attach them to the floor with carpet tape.  Make sure that you have a light switch at the top of the stairs and the bottom of the stairs. If you do not have them, ask someone to add them for you. What else can I do to help prevent falls?  Wear shoes that:  Do not have high heels.  Have rubber bottoms.  Are comfortable and fit you well.  Are closed at the toe. Do not wear sandals.  If you  use a stepladder:  Make sure that it is fully opened. Do not climb a closed stepladder.  Make sure that both sides of the stepladder are locked into place.  Ask someone to hold it for you, if possible.  Clearly mark and make sure that you can see:  Any grab bars or handrails.  First and last steps.  Where the edge of each step is.  Use tools that help you move around (mobility aids) if they are needed. These include:  Canes.  Walkers.  Scooters.  Crutches.  Turn on the lights when you go into a dark area. Replace any light bulbs as soon as they burn out.  Set up your furniture so you have a clear path. Avoid moving your furniture around.  If any of your floors are uneven, fix them.  If there are any pets around you, be aware of where they are.  Review your medicines with your doctor. Some medicines can make you feel dizzy. This can increase your chance of falling. Ask your doctor what other things that you can do to help prevent falls. This information is not intended to replace advice given to you by your health care provider. Make sure you discuss any questions you have with your health care provider. Document Released: 02/20/2009 Document Revised: 10/02/2015 Document Reviewed: 05/31/2014 Elsevier Interactive Patient Education  2017 [employer].

## 2020-05-28 ENCOUNTER — Other Ambulatory Visit: Payer: Medicare Other

## 2020-06-10 DIAGNOSIS — H401132 Primary open-angle glaucoma, bilateral, moderate stage: Secondary | ICD-10-CM | POA: Diagnosis not present

## 2020-06-23 NOTE — Patient Instructions (Addendum)
Options for blood pressure 1. If your dentist knows of a different ARB than irbesartan which is better for dry mouth- im open to trying but I did not see an obvious other one which performs better 2. You have been on ace-inhibitors but potentially related to cough like lisinopril 3. We disussed possible amlodipine 2.5 mg but this could cause ankle swelling 4. Diuretics like hydrochlorothiazide are more likely to cause dry mouth so I don't think that's a great option 5. The above 4 groups are the most common blood pressure medicines I uses so we did not see a great option- you preferred in the end to stay on irbesartan. We opted to try 37.5 mg or half tablet of irbesartan 75 mg as long as home blood pressure remains <135/85  For dry mouth- we will see if better on lower dose. Also has dry eyes- I would ask dentist and eye doctor if they are concerned about Sjogren's (but sounds like may have already been tested)  We will call you within two weeks about your referral to allergist. If you do not hear within 2 weeks, give [place] a call.

## 2020-06-23 NOTE — Progress Notes (Signed)
Phone [phone] In person visit   Subjective:   Destiny Acosta is a 75 y.o. year old very pleasant female patient who presents for/with See problem oriented charting Chief Complaint  Patient presents with  . Heartburn  . Medication Discussion     Blood Pressure Medication discussion     This visit occurred during the SARS-CoV-2 public health emergency.  Safety protocols were in place, including screening questions prior to the visit, additional usage of staff PPE, and extensive cleaning of exam room while observing appropriate contact time as indicated for disinfecting solutions.   Past Medical History-  Patient Active Problem List   Diagnosis Date Noted  . Spinal stenosis of lumbar region 01/14/2016    Priority: Medium  . Glaucoma 04/16/2015    Priority: Medium  . Suprapubic discomfort 09/17/2014    Priority: Medium  . Erosive osteoarthritis of both hands 08/10/2011    Priority: Medium  . Mild aortic regurgitation 05/14/2010    Priority: Medium  . Hyperlipidemia 12/06/2006    Priority: Medium  . Hypertension 12/06/2006    Priority: Medium  . GERD (gastroesophageal reflux disease) 12/06/2006    Priority: Medium  . Palpitations 04/16/2015    Priority: Low  . Allergic rhinitis 06/10/2014    Priority: Low  . Insomnia 06/10/2014    Priority: Low  . History of adenomatous polyp of colon 12/21/2006    Priority: Low  . Primary open-angle glaucoma 08/30/2016    Medications- reviewed and updated Current Outpatient Medications  Medication Sig Dispense Refill  . Calcium Carbonate-Vitamin D 600-400 MG-UNIT tablet Take 1 tablet by mouth daily.    . dorzolamide-timolol (COSOPT) 22.3-6.8 MG/ML ophthalmic solution INTILL 1 DROP INTO BOTH EYES TWICE A DAY    . estradiol (ESTRACE) 0.1 MG/GM vaginal cream Place 1 g vaginally once a week.    . famotidine (PEPCID) 10 MG tablet Take 10 mg by mouth 2 (two) times daily.    . fexofenadine (ALLEGRA) 180 MG tablet Take 180 mg by mouth  daily as needed.    . irbesartan (AVAPRO) 75 MG tablet Take 1 tablet (75 mg total) by mouth daily. 90 tablet 3  . latanoprost (XALATAN) 0.005 % ophthalmic solution Place 1 drop into both eyes at bedtime.    . Multiple Vitamin (MULTIVITAMIN) capsule Take 1 capsule by mouth daily.    Marland Kitchen tretinoin (RETIN-A) 0.1 % cream Apply topically at bedtime.     No current facility-administered medications for this visit.     Objective:  BP 126/81   Pulse 84   Temp (!) 97.2 F (36.2 C) (Temporal)   Ht 5\' 4"  (1.626 m)   Wt 156 lb 6.4 oz (70.9 kg)   SpO2 97%   BMI 26.85 kg/m  Gen: NAD, resting comfortably CV: RRR no murmurs rubs or gallops Lungs: CTAB no crackles, wheeze, rhonchi Abdomen: soft/nontender/nondistended/normal bowel sounds.  Ext: no edema Skin: warm, dry    Assessment and Plan   #hypertension S: medication: Irbesartan 75 mg (cough on lisinopril in the past). Does get some dry mouth BP Readings from Last 3 Encounters:  06/24/20 126/81  03/17/20 122/70  12/14/19 118/70  A/P: good control.   Options for blood pressure with concern that dry mouth worse on irbesartan 1. If your dentist knows of a different ARB than irbesartan which is better for dry mouth- im open to trying but I did not see an obvious other one which performs better 2. You have been on ace-inhibitors but potentially related to cough  like lisinopril 3. We disussed possible amlodipine 2.5 mg but this could cause ankle swelling 4. Diuretics like hydrochlorothiazide are more likely to cause dry mouth so I don't think that's a great option 5. The above 4 groups are the most common blood pressure medicines I uses so we did not see a great option- you preferred in the end to stay on irbesartan. We opted to try 37.5 mg or half tablet of irbesartan 75 mg as long as home blood pressure remains <135/85   # GERD/dysphagia with certain foods S: Medication: Omeprazole 40 mg as needed with plan to transition to 20 mg  over-the-counter then later pepcid 10 mg BID -has trialed some of the following: egg, milk, dairy if low fat has not caused issues -fatty foods and spciy foods are triggers. Bite of salmon makes her want to spit up food and feel some tightness in throat and a lot of phlegm. Recently with similar issue with tuna fish- may have fish allergy (has had no issues in the past). Had heart burn that night after this happened- pepid tums eventually helped  -for a number of visits dentist has said mouth is dry - worse with newer BP medication - see discussion above A/P: food sensitivity- refer to allergist for their opinion  GERD for most part apperas well controlled- continue pepcid 10 mg BID and avoid food triggers that she has noted. I do not think we need to start with GI as seems related to speific foods  For dry mouth- we will see if better on lower dose. Also has dry eyes- I would ask dentist and eye doctor if they are concerned about Sjogren's. Having to use biotene/[facility] brand morning and night. Also takes biotin for hair and naisl.   Recommended follow up: Return in about 3 months (around 09/21/2020) for follow up- or sooner if needed. Future Appointments  Date Time Provider [place]  06/26/2020  4:00 PM [facility] DX DEXA 1 [facility]  07/09/2020  2:40 PM [facility] DIAG TOMO 1 [facility]  07/09/2020  2:50 PM [facility] [place] 1 [facility]  05/28/2021  8:45 AM [facility] HEALTH COACH [facility]    Lab/Order associations:   ICD-10-CM   1. Primary hypertension  I10   2. Gastroesophageal reflux disease, unspecified whether esophagitis present  K21.9   3. Food allergy  Z91.018 Ambulatory referral to Allergy   Return precautions advised.  Garret Reddish, MD

## 2020-06-24 ENCOUNTER — Encounter: Payer: Self-pay | Admitting: Family Medicine

## 2020-06-24 ENCOUNTER — Ambulatory Visit (INDEPENDENT_AMBULATORY_CARE_PROVIDER_SITE_OTHER): Payer: Medicare Other | Admitting: Family Medicine

## 2020-06-24 ENCOUNTER — Other Ambulatory Visit: Payer: Self-pay

## 2020-06-24 VITALS — BP 126/81 | HR 84 | Temp 97.2°F | Ht 64.0 in | Wt 156.4 lb

## 2020-06-24 DIAGNOSIS — K219 Gastro-esophageal reflux disease without esophagitis: Secondary | ICD-10-CM | POA: Diagnosis not present

## 2020-06-24 DIAGNOSIS — I10 Essential (primary) hypertension: Secondary | ICD-10-CM

## 2020-06-24 DIAGNOSIS — Z91018 Allergy to other foods: Secondary | ICD-10-CM | POA: Diagnosis not present

## 2020-06-24 NOTE — Assessment & Plan Note (Signed)
S: medication: Irbesartan 75 mg (cough on lisinopril in the past). Does get some dry mouth BP Readings from Last 3 Encounters:  06/24/20 126/81  03/17/20 122/70  12/14/19 118/70  A/P: good control.   Options for blood pressure 1. If your dentist knows of a different ARB than irbesartan which is better for dry mouth- im open to trying but I did not see an obvious other one which performs better 2. You have been on ace-inhibitors but potentially related to cough like lisinopril 3. We disussed possible amlodipine 2.5 mg but this could cause ankle swelling 4. Diuretics like hydrochlorothiazide are more likely to cause dry mouth so I don't think that's a great option 5. The above 4 groups are the most common blood pressure medicines I uses so we did not see a great option- you preferred in the end to stay on irbesartan. We opted to try 37.5 mg or half tablet of irbesartan 75 mg as long as home blood pressure remains <135/85

## 2020-06-26 ENCOUNTER — Other Ambulatory Visit: Payer: Medicare Other

## 2020-07-09 ENCOUNTER — Other Ambulatory Visit: Payer: Self-pay

## 2020-07-09 ENCOUNTER — Ambulatory Visit
Admission: RE | Admit: 2020-07-09 | Discharge: 2020-07-09 | Disposition: A | Discharge status: Home / Self Care | Payer: Medicare Other | Source: Ambulatory Visit | Attending: Obstetrics and Gynecology | Admitting: Obstetrics and Gynecology

## 2020-07-09 DIAGNOSIS — R922 Inconclusive mammogram: Secondary | ICD-10-CM | POA: Diagnosis not present

## 2020-07-09 DIAGNOSIS — N6310 Unspecified lump in the right breast, unspecified quadrant: Secondary | ICD-10-CM

## 2020-07-09 DIAGNOSIS — N6489 Other specified disorders of breast: Secondary | ICD-10-CM | POA: Diagnosis not present

## 2020-07-12 ENCOUNTER — Other Ambulatory Visit: Payer: Self-pay

## 2020-07-12 ENCOUNTER — Ambulatory Visit
Admission: RE | Admit: 2020-07-12 | Discharge: 2020-07-12 | Disposition: A | Discharge status: Home / Self Care | Payer: Medicare Other | Source: Ambulatory Visit | Attending: Obstetrics and Gynecology | Admitting: Obstetrics and Gynecology

## 2020-07-12 DIAGNOSIS — E2839 Other primary ovarian failure: Secondary | ICD-10-CM

## 2020-07-12 DIAGNOSIS — M8589 Other specified disorders of bone density and structure, multiple sites: Secondary | ICD-10-CM | POA: Diagnosis not present

## 2020-07-12 DIAGNOSIS — Z780 Asymptomatic menopausal state: Secondary | ICD-10-CM | POA: Diagnosis not present

## 2020-07-15 ENCOUNTER — Ambulatory Visit: Payer: Self-pay | Admitting: Allergy

## 2020-07-16 ENCOUNTER — Ambulatory Visit (INDEPENDENT_AMBULATORY_CARE_PROVIDER_SITE_OTHER): Payer: Medicare Other | Admitting: Physician Assistant

## 2020-07-16 ENCOUNTER — Other Ambulatory Visit: Payer: Self-pay

## 2020-07-16 ENCOUNTER — Ambulatory Visit (INDEPENDENT_AMBULATORY_CARE_PROVIDER_SITE_OTHER): Payer: Medicare Other

## 2020-07-16 VITALS — BP 128/81 | HR 74 | Temp 98.1°F | Ht 64.0 in | Wt 157.2 lb

## 2020-07-16 DIAGNOSIS — R0781 Pleurodynia: Secondary | ICD-10-CM | POA: Diagnosis not present

## 2020-07-16 DIAGNOSIS — M8580 Other specified disorders of bone density and structure, unspecified site: Secondary | ICD-10-CM | POA: Diagnosis not present

## 2020-07-16 NOTE — Progress Notes (Signed)
Acute Office Visit  Subjective:    Patient ID: Destiny Acosta, female    DOB: 06/04/1945, 75 y.o.   MRN: [id number]  Chief Complaint  Patient presents with  . Rib Injury    Had a fall x 1 week ago right    HPI Patient with history of osteopenia is in today for right lower rib pain after fall on 07-07-20.  Patient states she was walking her usual walking trail which is paved but has roots coming out of it, when she suddenly stubbed her toe and tripped over a root and fell.  She landed on her right wrist which just has some small road rash abrasions, and also her right side.  She was able to continue to get up right away and finish her walk.  She says the pain in her right lower ribs has persisted and because of her osteopenia she wants to make sure that she does not have a fracture. Laying down and deep breathing worsens the pain. She was taking Tylenol and then Advil, but stopped these due to GERD issues.  Past Medical History:  Diagnosis Date  . Allergy    Eggplant/KIWI  . Aortal stenosis 05/14/2010  . Arthritis   . Cancer ([place]) 11/2015   skin cancer forehead; MOHS procedure 11/2015  . GERD 12/06/2006  . Heart murmur    aortic valve  . HYPERLIPIDEMIA 12/06/2006  . Hyperplastic colon polyp   . HYPERTENSION 12/06/2006  . MITRAL VALVE PROLAPSE 12/06/2006  . Osteopenia   . Spinal stenosis     Past Surgical History:  Procedure Laterality Date  . BREAST CYST ASPIRATION    . CATARACT EXTRACTION Right 03/07/2019  . TUBAL LIGATION      Family History  Problem Relation Age of Onset  . Colon cancer Father 86  . Atrial fibrillation Father   . Diabetes Mother   . Heart failure Mother   . Heart disease Mother 52       CABG, quit 20 years prior o bypass  . Diabetes Maternal Grandmother   . Arthritis Sister   . Rheum arthritis Paternal Grandmother   . Lung disease Paternal Grandfather     Social History   Socioeconomic History  . Marital status: Married    Spouse name: Not on  file  . Number of children: Not on file  . Years of education: Not on file  . Highest education level: Not on file  Occupational History  . Not on file  Tobacco Use  . Smoking status: Never Smoker  . Smokeless tobacco: Never Used  Vaping Use  . Vaping Use: Never used  Substance and Sexual Activity  . Alcohol use: Yes    Comment: rarely may have 1 wine a month or less  . Drug use: No  . Sexual activity: Not on file  Other Topics Concern  . Not on file  Social History Narrative   Married 1967. 1 son, lives in [place]. 1 grandson in [place] age 17.       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       Hobbies: family time, walking 2.5-3 miles daily, moviews    Social Determinants of Health   Financial Resource Strain: Low Risk   . Difficulty of Paying Living Expenses: Not hard at all  Food Insecurity: No Food Insecurity  . Worried About Charity fundraiser in the Last Year: Never true  . Ran Out of Food in the Last Year: Never  true  Transportation Needs: No Transportation Needs  . Lack of Transportation (Medical): No  . Lack of Transportation (Non-Medical): No  Physical Activity: Inactive  . Days of Exercise per Week: 0 days  . Minutes of Exercise per Session: 0 min  Stress: No Stress Concern Present  . Feeling of Stress : Not at all  Social Connections: Moderately Isolated  . Frequency of Communication with Friends and Family: More than three times a week  . Frequency of Social Gatherings with Friends and Family: Twice a week  . Attends Religious Services: Never  . Active Member of Clubs or Organizations: No  . Attends Archivist Meetings: Never  . Marital Status: Married  Human resources officer Violence: Not At Risk  . Fear of Current or Ex-Partner: No  . Emotionally Abused: No  . Physically Abused: No  . Sexually Abused: No    Outpatient Medications Prior to Visit  Medication Sig Dispense Refill  . Calcium Carbonate-Vitamin D 600-400  MG-UNIT tablet Take 1 tablet by mouth daily.    . dorzolamide-timolol (COSOPT) 22.3-6.8 MG/ML ophthalmic solution INTILL 1 DROP INTO BOTH EYES TWICE A DAY    . estradiol (ESTRACE) 0.1 MG/GM vaginal cream Place 1 g vaginally once a week.    . famotidine (PEPCID) 10 MG tablet Take 10 mg by mouth 2 (two) times daily.    . fexofenadine (ALLEGRA) 180 MG tablet Take 180 mg by mouth daily as needed.    . irbesartan (AVAPRO) 75 MG tablet Take 1 tablet (75 mg total) by mouth daily. (Patient taking differently: Take 75 mg by mouth daily. Patient taking 1/2 tablet daily) 90 tablet 3  . latanoprost (XALATAN) 0.005 % ophthalmic solution Place 1 drop into both eyes at bedtime.    . Multiple Vitamin (MULTIVITAMIN) capsule Take 1 capsule by mouth daily.    Marland Kitchen tretinoin (RETIN-A) 0.1 % cream Apply topically at bedtime.     No facility-administered medications prior to visit.    Allergies  Allergen Reactions  . Prednisolone Swelling  . Tizanidine Anaphylaxis  . Ace Inhibitors Cough  . Ciprofloxacin Other (See Comments)    Muscle pain  . Codeine Phosphate     REACTION: nausea, vomiting  . Kiwi Extract Nausea And Vomiting  . Latex Rash    Review of Systems  Constitutional: Negative for fever.  Cardiovascular: Negative for chest pain.  Musculoskeletal: Positive for arthralgias.  Neurological: Negative for dizziness, seizures and headaches.       Objective:    Physical Exam Vitals and nursing note reviewed.  Constitutional:      General: She is not in acute distress.    Appearance: Normal appearance.  Chest:    Abdominal:     General: Abdomen is flat. Bowel sounds are normal.     Palpations: Abdomen is soft. There is no mass.     Tenderness: There is no abdominal tenderness.  Neurological:     Mental Status: She is alert.     BP 128/81   Pulse 74   Temp 98.1 F (36.7 C)   Ht 5\' 4"  (1.626 m)   Wt 157 lb 3.2 oz (71.3 kg)   SpO2 99%   BMI 26.98 kg/m  Wt Readings from Last 3  Encounters:  07/16/20 157 lb 3.2 oz (71.3 kg)  06/24/20 156 lb 6.4 oz (70.9 kg)  03/17/20 153 lb (69.4 kg)    Health Maintenance Due  Topic Date Due  . COVID-19 Vaccine (4 - Booster for Pfizer series) 07/06/2020  There are no preventive care reminders to display for this patient.   Lab Results  Component Value Date   TSH 1.374 05/28/2019   Lab Results  Component Value Date   WBC 3.7 (L) 09/17/2019   HGB 13.0 09/17/2019   HCT 37.7 09/17/2019   MCV 92.3 09/17/2019   PLT 267.0 09/17/2019   Lab Results  Component Value Date   NA 135 09/17/2019   K 4.2 09/17/2019   CO2 29 09/17/2019   GLUCOSE 83 09/17/2019   BUN 11 09/17/2019   CREATININE 0.55 09/17/2019   BILITOT 1.3 (H) 09/17/2019   ALKPHOS 63 09/17/2019   AST 20 09/17/2019   ALT 21 09/17/2019   PROT 6.9 09/17/2019   ALBUMIN 4.3 09/17/2019   CALCIUM 9.3 09/17/2019   GFR 107.78 09/17/2019   Lab Results  Component Value Date   CHOL 195 09/17/2019   Lab Results  Component Value Date   HDL 54.60 09/17/2019   Lab Results  Component Value Date   LDLCALC 123 (H) 09/17/2019   Lab Results  Component Value Date   TRIG 88.0 09/17/2019   Lab Results  Component Value Date   CHOLHDL 4 09/17/2019   No results found for: HGBA1C     Assessment & Plan:   Problem List Items Addressed This Visit   None   Visit Diagnoses    Rib pain on right side    -  Primary   Relevant Orders   DG Ribs Unilateral Right   Osteopenia, unspecified location         1. Rib pain on right side X-ray of the right ribs will be obtained in the office today.  I will call with official radiology report.  In the meantime she should continue Tylenol, Aleve, ice, or heat as needed for pain.  A small pillow to hold against her side with coughing or sneezing can also be helpful.  She can continue regular activity as tolerated.  2. Osteopenia, unspecified location Her most recent bone density scan was done on 07-12-20.  Her risk of major  osteoporotic fracture within the next 10 years was determined to be 16.8%.  Patient states that her gynecologist helps her to manage her osteopenia.  She is going to continue dialogue with her about whether or not she should start on treatment at this time.  She has concerns about bisphosphonates after discussion with her dentist and the possibility of osteonecrosis of the jaw.  She does need to continue her calcium and vitamin D supplementation daily.   Antawan Mchugh M Majesti Gambrell, PA-C

## 2020-07-16 NOTE — Patient Instructions (Signed)
Please go to the lab for an XRAY of the right ribs today. I will call with report of this or send through [place].   You may hold a small firm pillow against your side when coughing or sneezing.  Tylenol, Aleve, ice, or heat as needed for pain.  Continue your calcium and Vit D for osteopenia.

## 2020-07-17 ENCOUNTER — Telehealth: Payer: Self-pay

## 2020-07-17 NOTE — Telephone Encounter (Signed)
Pt called regarding x-ray results. Please advise.

## 2020-07-18 NOTE — Telephone Encounter (Signed)
Patient seen her results on my chart and she read Alyssa's notes and she gave a verbal understanding.

## 2020-07-23 ENCOUNTER — Other Ambulatory Visit (HOSPITAL_COMMUNITY): Payer: Self-pay | Admitting: Obstetrics and Gynecology

## 2020-07-23 DIAGNOSIS — M5451 Vertebrogenic low back pain: Secondary | ICD-10-CM

## 2020-07-24 ENCOUNTER — Ambulatory Visit (HOSPITAL_BASED_OUTPATIENT_CLINIC_OR_DEPARTMENT_OTHER)
Admission: RE | Admit: 2020-07-24 | Discharge: 2020-07-24 | Disposition: A | Discharge status: Home / Self Care | Payer: Medicare Other | Source: Ambulatory Visit | Attending: Obstetrics and Gynecology | Admitting: Obstetrics and Gynecology

## 2020-07-24 ENCOUNTER — Other Ambulatory Visit: Payer: Self-pay

## 2020-07-24 DIAGNOSIS — M4316 Spondylolisthesis, lumbar region: Secondary | ICD-10-CM | POA: Insufficient documentation

## 2020-07-24 DIAGNOSIS — M5451 Vertebrogenic low back pain: Secondary | ICD-10-CM | POA: Diagnosis present

## 2020-07-24 DIAGNOSIS — M4856XA Collapsed vertebra, not elsewhere classified, lumbar region, initial encounter for fracture: Secondary | ICD-10-CM | POA: Diagnosis not present

## 2020-07-24 DIAGNOSIS — M545 Low back pain, unspecified: Secondary | ICD-10-CM | POA: Diagnosis not present

## 2020-08-12 ENCOUNTER — Other Ambulatory Visit: Payer: Self-pay

## 2020-08-12 ENCOUNTER — Encounter: Payer: Self-pay | Admitting: Family Medicine

## 2020-08-12 ENCOUNTER — Encounter: Payer: Self-pay | Admitting: Physician Assistant

## 2020-08-12 DIAGNOSIS — R1310 Dysphagia, unspecified: Secondary | ICD-10-CM

## 2020-08-21 ENCOUNTER — Ambulatory Visit: Payer: Medicare Other

## 2020-08-22 ENCOUNTER — Telehealth: Payer: Self-pay | Admitting: Family Medicine

## 2020-08-22 NOTE — Chronic Care Management (AMB) (Signed)
  Chronic Care Management   Note  08/22/2020 Name: Destiny Acosta MRN: [id number] DOB: 02-10-1946  Destiny Acosta is a 75 y.o. year old female who is a primary care patient of Marin Olp, MD. I reached out to Marthenia Rolling by phone today in response to a referral sent by Destiny Acosta's PCP, Yong Channel Brayton Mars, MD.   Destiny Acosta was given information about Chronic Care Management services today including:  1. CCM service includes personalized support from designated clinical staff supervised by her physician, including individualized plan of care and coordination with other care providers 2. 24/7 contact phone numbers for assistance for urgent and routine care needs. 3. Service will only be billed when office clinical staff spend 20 minutes or more in a month to coordinate care. 4. Only one practitioner may furnish and bill the service in a calendar month. 5. The patient may stop CCM services at any time (effective at the end of the month) by phone call to the office staff.   Patient agreed to services and verbal consent obtained.   Follow up plan:   Lauretta Grill Upstream Scheduler

## 2020-08-28 ENCOUNTER — Other Ambulatory Visit: Payer: Self-pay

## 2020-08-28 ENCOUNTER — Encounter: Payer: Self-pay | Admitting: Physician Assistant

## 2020-08-28 ENCOUNTER — Ambulatory Visit (INDEPENDENT_AMBULATORY_CARE_PROVIDER_SITE_OTHER): Payer: Medicare Other | Admitting: Physician Assistant

## 2020-08-28 VITALS — BP 154/84 | HR 76 | Ht 64.0 in | Wt 158.2 lb

## 2020-08-28 DIAGNOSIS — Z86010 Personal history of colonic polyps: Secondary | ICD-10-CM | POA: Diagnosis not present

## 2020-08-28 DIAGNOSIS — R1013 Epigastric pain: Secondary | ICD-10-CM

## 2020-08-28 DIAGNOSIS — K219 Gastro-esophageal reflux disease without esophagitis: Secondary | ICD-10-CM

## 2020-08-28 DIAGNOSIS — K222 Esophageal obstruction: Secondary | ICD-10-CM

## 2020-08-28 MED ORDER — FAMOTIDINE 20 MG PO TABS: 20.0000 mg | ORAL_TABLET | Freq: Two times a day (BID) | ORAL | 11 refills | Status: DC

## 2020-08-28 NOTE — Progress Notes (Signed)
Subjective:    Patient ID: Destiny Acosta, female    DOB: [birth year], 75 y.o.   MRN: [id number]  HPI Destiny Acosta is a pleasant 75 year old white female, established with Dr. Henrene Pastor who comes in today with complaints of recurrent dysphagia. Patient has history of hypertension, GERD, osteoarthritis, adenomatous colon polyps. She last underwent EGD in 2007 for complaints of dysphagia and was found to have a distal esophageal stricture with the lumen of 15 mm, she was dilated #54 [place], noted to have mild esophagitis and no evidence of Barrett's. Last colonoscopy was done in October 2017 for follow-up of sessile serrated polyp.  She had 3 polyps removed all 1 to 5 mm in size.  Path confirmed all of these to be sessile serrated adenomas and she is indicated for 5-year interval follow-up. Patient says that she has had some recurrence of dysphagia over the past several years and is gone through periods of time where the symptoms are almost constant and then periods when she does not have much difficulty.  She has been having increase symptoms over the past year or so.  She says that she has altered her diet, cuts everything into very small pieces and chews very carefully.  She had been concerned about something being off with her bite and that has caused her to eat more slowly as well. She says interesting sometimes symptoms will occur after the first bite of food and sometimes not until the end of the meal.  She will have episodes of food "lodging" which becomes very uncomfortable.  Usually if she stops eating food will go on down.  Occasionally she may have symptoms with liquids as well She reports frequent heartburn and has been eating a lot of Tums.  She has been on low-dose Pepcid 10 mg twice daily.  She has been reluctant to take PPI because she has osteopenia.  She says she is trying to eat very healthily, but recently has been having more difficulty eating a lot of high-fiber foods etc. due to the  dysphagia.  She also reports nocturnal sour brash She says very occasionally she will see a small amount of bright red blood only if she has hard stools and will attributes this to a small tear.    Review of Systems Pertinent positive and negative review of systems were noted in the above HPI section.  All other review of systems was otherwise negative.  Outpatient Encounter Medications as of 08/28/2020  Medication Sig  . Calcium Carbonate-Vitamin D 600-400 MG-UNIT tablet Take 1 tablet by mouth daily.  . dorzolamide-timolol (COSOPT) 22.3-6.8 MG/ML ophthalmic solution INTILL 1 DROP INTO BOTH EYES TWICE A DAY  . estradiol (ESTRACE) 0.1 MG/GM vaginal cream Place 1 g vaginally once a week.  . famotidine (PEPCID) 20 MG tablet Take 1 tablet (20 mg total) by mouth 2 (two) times daily.  . fexofenadine (ALLEGRA) 180 MG tablet Take 180 mg by mouth daily as needed.  . irbesartan (AVAPRO) 75 MG tablet Take 1 tablet (75 mg total) by mouth daily. (Patient taking differently: Take 75 mg by mouth daily. Patient taking 1/2 tablet daily)  . latanoprost (XALATAN) 0.005 % ophthalmic solution Place 1 drop into both eyes at bedtime.  . Multiple Vitamin (MULTIVITAMIN) capsule Take 1 capsule by mouth daily.  Marland Kitchen tretinoin (RETIN-A) 0.1 % cream Apply topically at bedtime.  . [DISCONTINUED] famotidine (PEPCID) 10 MG tablet Take 10 mg by mouth 2 (two) times daily.   No facility-administered encounter medications on file as  of 08/28/2020.   Allergies  Allergen Reactions  . Prednisolone Swelling  . Tizanidine Anaphylaxis  . Ace Inhibitors Cough  . Ciprofloxacin Other (See Comments)    Muscle pain  . Codeine Phosphate     REACTION: nausea, vomiting  . Kiwi Extract Nausea And Vomiting  . Latex Rash   Patient Active Problem List   Diagnosis Date Noted  . Primary open-angle glaucoma 08/30/2016  . Spinal stenosis of lumbar region 01/14/2016  . Glaucoma 04/16/2015  . Palpitations 04/16/2015  . Suprapubic discomfort  09/17/2014  . Allergic rhinitis 06/10/2014  . Insomnia 06/10/2014  . Erosive osteoarthritis of both hands 08/10/2011  . Mild aortic regurgitation 05/14/2010  . History of adenomatous polyp of colon 12/21/2006  . Hyperlipidemia 12/06/2006  . Hypertension 12/06/2006  . GERD (gastroesophageal reflux disease) 12/06/2006   Social History   Socioeconomic History  . Marital status: Married    Spouse name: Not on file  . Number of children: Not on file  . Years of education: Not on file  . Highest education level: Not on file  Occupational History  . Not on file  Tobacco Use  . Smoking status: Never Smoker  . Smokeless tobacco: Never Used  Vaping Use  . Vaping Use: Never used  Substance and Sexual Activity  . Alcohol use: Yes    Comment: rarely may have 1 wine a month or less  . Drug use: No  . Sexual activity: Not on file  Other Topics Concern  . Not on file  Social History Narrative   Married 1967. 1 son, lives in [place]. 1 grandson in [place] age 76.       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       Hobbies: family time, walking 2.5-3 miles daily, moviews    Social Determinants of Health   Financial Resource Strain: Low Risk   . Difficulty of Paying Living Expenses: Not hard at all  Food Insecurity: No Food Insecurity  . Worried About Charity fundraiser in the Last Year: Never true  . Ran Out of Food in the Last Year: Never true  Transportation Needs: No Transportation Needs  . Lack of Transportation (Medical): No  . Lack of Transportation (Non-Medical): No  Physical Activity: Inactive  . Days of Exercise per Week: 0 days  . Minutes of Exercise per Session: 0 min  Stress: No Stress Concern Present  . Feeling of Stress : Not at all  Social Connections: Moderately Isolated  . Frequency of Communication with Friends and Family: More than three times a week  . Frequency of Social Gatherings with Friends and Family: Twice a week  . Attends  Religious Services: Never  . Active Member of Clubs or Organizations: No  . Attends Archivist Meetings: Never  . Marital Status: Married  Human resources officer Violence: Not At Risk  . Fear of Current or Ex-Partner: No  . Emotionally Abused: No  . Physically Abused: No  . Sexually Abused: No    Ms. Barham's family history includes Arthritis in her sister; Atrial fibrillation in her father; Colon cancer (age of onset: 7) in her father; Diabetes in her maternal grandmother and mother; Heart disease in her maternal grandfather, maternal grandmother, paternal grandfather, and paternal grandmother; Heart disease (age of onset: 30) in her mother; Heart failure in her mother; Lung disease in her paternal grandfather; Prostate cancer in her father; Rheum arthritis in her paternal grandmother.      Objective:  Vitals:   08/28/20 1005  BP: (!) 154/84  Pulse: 76    Physical Exam Well-developed well-nourished older white female in no acute distress.  Height, Weight, BMI 27.1  HEENT; nontraumatic normocephalic, EOMI, PE R LA, sclera anicteric. Oropharynx; not examined today Neck; supple, no JVD Cardiovascular; regular rate and rhythm with S1-S2, no murmur rub or gallop Pulmonary; Clear bilaterally Abdomen; soft, nontender, nondistended, no palpable mass or hepatosplenomegaly, bowel sounds are active Rectal; not done today Skin; benign exam, no jaundice rash or appreciable lesions Extremities; no clubbing cyanosis or edema skin warm and dry Neuro/Psych; alert and oriented x4, grossly nonfocal mood and affect appropriate       Assessment & Plan:   #39 76 year old white female with complaints of dysphagia, symptomatic over the past couple of years with some progression most difficulty with solid foods but occasionally will note symptoms with liquids. Patient has prior history of distal esophageal stricture requiring dilation in 2007  Symptoms are consistent with recurrent  esophageal stricture  #2 GERD-poorly controlled on low-dose Pepcid 10 mg twice daily Patient reluctant to take PPI  #3 history of sessile serrated polyps-will be due for follow-up colonoscopy October 2022 #4 hypertension #5.  Osteoarthritis  Plan; strict antireflux regimen, most of which she is already doing, we reviewed n.p.o. for 2 to 3 hours prior to bedtime and elevation of the back 45 degrees while sleeping.  Continue antireflux diet. Increase Pepcid to 20 mg p.o. twice daily AC breakfast and AC dinner.  New prescription sent. If symptoms are not controlled with 20 mg Pepcid twice daily consider increasing to 40 mg or switching to PPI.  Patient preferring to avoid PPI at present. Patient will be scheduled for upper endoscopy with probable esophageal dilation with Dr. Henrene Pastor.  Procedure was discussed in detail with the patient including indications risk and benefits and she is agreeable to proceed. Will plan for colonoscopy fall 2022.  Destiny Acosta Genia Harold PA-C 08/28/2020   Cc: Marin Olp, MD

## 2020-08-28 NOTE — Progress Notes (Signed)
Assessment and plan reviewed 

## 2020-08-28 NOTE — Patient Instructions (Addendum)
If you are age 76 or older, your body mass index should be between 23-30. Your Body mass index is 27.15 kg/m. If this is out of the aforementioned range listed, please consider follow up with your Primary Care Provider.  If you are age 63 or younger, your body mass index should be between 19-25. Your Body mass index is 27.15 kg/m. If this is out of the aformentioned range listed, please consider follow up with your Primary Care Provider.   You have been scheduled for an endoscopy. Please follow written instructions given to you at your visit today. If you use inhalers (even only as needed), please bring them with you on the day of your procedure.  Increase your Famotidine to 20 mg to twice daily before breakfast and dinner.  Follow an Anti-reflux diet and Anti-reflux precautions.  Thank you for entrusting me with your care and choosing [facility].  Amy Esterwood, PA-C

## 2020-09-02 ENCOUNTER — Emergency Department (HOSPITAL_BASED_OUTPATIENT_CLINIC_OR_DEPARTMENT_OTHER)
Admission: EM | Admit: 2020-09-02 | Discharge: 2020-09-02 | Disposition: A | Discharge status: Home / Self Care | Payer: Medicare Other | Attending: Emergency Medicine | Admitting: Emergency Medicine

## 2020-09-02 ENCOUNTER — Encounter (HOSPITAL_BASED_OUTPATIENT_CLINIC_OR_DEPARTMENT_OTHER): Payer: Self-pay | Admitting: Obstetrics and Gynecology

## 2020-09-02 ENCOUNTER — Other Ambulatory Visit: Payer: Self-pay

## 2020-09-02 ENCOUNTER — Telehealth: Payer: Self-pay

## 2020-09-02 ENCOUNTER — Emergency Department (HOSPITAL_BASED_OUTPATIENT_CLINIC_OR_DEPARTMENT_OTHER): Payer: Medicare Other | Admitting: Radiology

## 2020-09-02 ENCOUNTER — Emergency Department (HOSPITAL_BASED_OUTPATIENT_CLINIC_OR_DEPARTMENT_OTHER): Payer: Medicare Other

## 2020-09-02 DIAGNOSIS — R0789 Other chest pain: Secondary | ICD-10-CM | POA: Insufficient documentation

## 2020-09-02 DIAGNOSIS — Z85828 Personal history of other malignant neoplasm of skin: Secondary | ICD-10-CM | POA: Diagnosis not present

## 2020-09-02 DIAGNOSIS — Z91040 Latex allergy status: Secondary | ICD-10-CM | POA: Diagnosis not present

## 2020-09-02 DIAGNOSIS — Z79899 Other long term (current) drug therapy: Secondary | ICD-10-CM | POA: Diagnosis not present

## 2020-09-02 DIAGNOSIS — R079 Chest pain, unspecified: Secondary | ICD-10-CM

## 2020-09-02 DIAGNOSIS — J9811 Atelectasis: Secondary | ICD-10-CM | POA: Diagnosis not present

## 2020-09-02 DIAGNOSIS — R0602 Shortness of breath: Secondary | ICD-10-CM | POA: Diagnosis not present

## 2020-09-02 DIAGNOSIS — R1013 Epigastric pain: Secondary | ICD-10-CM | POA: Diagnosis not present

## 2020-09-02 DIAGNOSIS — I10 Essential (primary) hypertension: Secondary | ICD-10-CM | POA: Insufficient documentation

## 2020-09-02 DIAGNOSIS — R0781 Pleurodynia: Secondary | ICD-10-CM | POA: Diagnosis not present

## 2020-09-02 LAB — CBC WITH DIFFERENTIAL/PLATELET
Abs Immature Granulocytes: 0.01 10*3/uL (ref 0.00–0.07)
Basophils Absolute: 0.1 10*3/uL (ref 0.0–0.1)
Basophils Relative: 1 %
Eosinophils Absolute: 0.1 10*3/uL (ref 0.0–0.5)
Eosinophils Relative: 2 %
HCT: 42.5 % (ref 36.0–46.0)
Hemoglobin: 14.2 g/dL (ref 12.0–15.0)
Immature Granulocytes: 0 %
Lymphocytes Relative: 31 %
Lymphs Abs: 1.5 10*3/uL (ref 0.7–4.0)
MCH: 31 pg (ref 26.0–34.0)
MCHC: 33.4 g/dL (ref 30.0–36.0)
MCV: 92.8 fL (ref 80.0–100.0)
Monocytes Absolute: 0.5 10*3/uL (ref 0.1–1.0)
Monocytes Relative: 10 %
Neutro Abs: 2.7 10*3/uL (ref 1.7–7.7)
Neutrophils Relative %: 56 %
Platelets: 292 10*3/uL (ref 150–400)
RBC: 4.58 MIL/uL (ref 3.87–5.11)
RDW: 13.1 % (ref 11.5–15.5)
WBC: 4.9 10*3/uL (ref 4.0–10.5)
nRBC: 0 % (ref 0.0–0.2)

## 2020-09-02 LAB — TROPONIN I (HIGH SENSITIVITY)
Troponin I (High Sensitivity): 4 ng/L (ref <18)
Troponin I (High Sensitivity): 5 ng/L (ref <18)

## 2020-09-02 LAB — COMPREHENSIVE METABOLIC PANEL
ALT: 15 U/L (ref 0–44)
AST: 20 U/L (ref 15–41)
Albumin: 4.7 g/dL (ref 3.5–5.0)
Alkaline Phosphatase: 66 U/L (ref 38–126)
Anion gap: 10 (ref 5–15)
BUN: 12 mg/dL (ref 8–23)
CO2: 25 mmol/L (ref 22–32)
Calcium: 10 mg/dL (ref 8.9–10.3)
Chloride: 103 mmol/L (ref 98–111)
Creatinine, Ser: 0.52 mg/dL (ref 0.44–1.00)
GFR, Estimated: >60 mL/min (ref >60)
Glucose, Bld: 96 mg/dL (ref 70–99)
Potassium: 4 mmol/L (ref 3.5–5.1)
Sodium: 138 mmol/L (ref 135–145)
Total Bilirubin: 0.6 mg/dL (ref 0.3–1.2)
Total Protein: 7.8 g/dL (ref 6.5–8.1)

## 2020-09-02 LAB — D-DIMER, QUANTITATIVE: D-Dimer, Quant: <0.27 ug/mL-FEU (ref 0.00–0.50)

## 2020-09-02 LAB — LIPASE, BLOOD: Lipase: 35 U/L (ref 11–51)

## 2020-09-02 MED ORDER — ONDANSETRON HCL 4 MG/2ML IJ SOLN
4.0000 mg | Freq: Once | INTRAMUSCULAR | Status: AC
  Administered 2020-09-02: 4 mg via INTRAVENOUS
  Filled 2020-09-02: qty 2

## 2020-09-02 MED ORDER — IOHEXOL 300 MG/ML  SOLN
75.0000 mL | Freq: Once | INTRAMUSCULAR | Status: AC | PRN
  Administered 2020-09-02: 75 mL via INTRAVENOUS

## 2020-09-02 MED ORDER — SODIUM CHLORIDE 0.9 % IV BOLUS
1000.0000 mL | Freq: Once | INTRAVENOUS | Status: AC
  Administered 2020-09-02: 1000 mL via INTRAVENOUS

## 2020-09-02 MED ORDER — MORPHINE SULFATE (PF) 4 MG/ML IV SOLN
4.0000 mg | Freq: Once | INTRAVENOUS | Status: AC
  Administered 2020-09-02: 4 mg via INTRAVENOUS
  Filled 2020-09-02: qty 1

## 2020-09-02 MED ORDER — KETOROLAC TROMETHAMINE 30 MG/ML IJ SOLN
15.0000 mg | Freq: Once | INTRAMUSCULAR | Status: AC
  Administered 2020-09-02: 15 mg via INTRAVENOUS
  Filled 2020-09-02: qty 1

## 2020-09-02 MED ORDER — MORPHINE SULFATE (PF) 2 MG/ML IV SOLN
2.0000 mg | Freq: Once | INTRAVENOUS | Status: AC
  Administered 2020-09-02: 2 mg via INTRAVENOUS
  Filled 2020-09-02: qty 1

## 2020-09-02 MED ORDER — ALUM & MAG HYDROXIDE-SIMETH 200-200-20 MG/5ML PO SUSP
30.0000 mL | Freq: Once | ORAL | Status: AC
  Administered 2020-09-02: 30 mL via ORAL
  Filled 2020-09-02: qty 30

## 2020-09-02 NOTE — Telephone Encounter (Signed)
Patient is calling in stating she was having rib pain that was causing her shoulders to hurt and wants to know if Dr.Hunter will be able to see her for a quick minute. Explained that Dr.Hunter's schedule is completely full, tried offering an appointment tomorrow but Shantae states she didn't understand why Dr.Hunter couldn't just see her. Then explained that he has patients to see and in order for him to see her she would need to schedule an appointment. Patient declined saying she will just go to the ED.

## 2020-09-02 NOTE — ED Provider Notes (Signed)
[facility] EMERGENCY DEPT Provider Note   CSN: [id number] Arrival date & time: 09/02/20  1630     History Chief Complaint  Patient presents with  . Rib Injury    Destiny Acosta is a 75 y.o. female.  75 yo F with a cc of left-sided chest pain.  This started in her neck and now is moved to the left lateral chest wall.  Has been going on for a few hours now.  Worse with deep breathing or lying back flat.  She had a broken rib a few weeks ago that was diagnosed by her family doctor.  She thinks maybe it was rebroken.  Denies trauma.  Denies cough congestion or fever.  Has been complaining of dysphagia and reflux that she is going to have an endoscopy done in the recent future.  She denies nausea or vomiting.  Denies abdominal pain.  The history is provided by the patient.  Chest Pain Pain location:  L chest Pain quality: aching, sharp and shooting   Pain radiates to:  Does not radiate Pain severity:  Moderate Onset quality:  Gradual Duration:  2 hours Timing:  Constant Progression:  Worsening Chronicity:  New Relieved by:  Nothing Worsened by:  Nothing Ineffective treatments:  None tried Associated symptoms: shortness of breath   Associated symptoms: no dizziness, no fever, no headache, no nausea, no palpitations and no vomiting        Past Medical History:  Diagnosis Date  . Allergy    Eggplant/KIWI  . Aortal stenosis 05/14/2010  . Arthritis   . Cancer ([place]) 11/2015   skin cancer forehead; MOHS procedure 11/2015  . Colon polyps   . GERD 12/06/2006  . Glaucoma   . Heart murmur    aortic valve  . HYPERLIPIDEMIA 12/06/2006  . Hyperplastic colon polyp   . HYPERTENSION 12/06/2006  . Lung granuloma ([place])   . MITRAL VALVE PROLAPSE 12/06/2006  . Osteopenia   . Spinal stenosis     Patient Active Problem List   Diagnosis Date Noted  . Primary open-angle glaucoma 08/30/2016  . Spinal stenosis of lumbar region 01/14/2016  . Glaucoma 04/16/2015  .  Palpitations 04/16/2015  . Suprapubic discomfort 09/17/2014  . Allergic rhinitis 06/10/2014  . Insomnia 06/10/2014  . Erosive osteoarthritis of both hands 08/10/2011  . Mild aortic regurgitation 05/14/2010  . History of adenomatous polyp of colon 12/21/2006  . Hyperlipidemia 12/06/2006  . Hypertension 12/06/2006  . GERD (gastroesophageal reflux disease) 12/06/2006    Past Surgical History:  Procedure Laterality Date  . BREAST CYST ASPIRATION    . CATARACT EXTRACTION Right 03/07/2019  . TUBAL LIGATION       OB History   No obstetric history on file.     Family History  Problem Relation Age of Onset  . Colon cancer Father 77  . Atrial fibrillation Father   . Prostate cancer Father   . Diabetes Mother   . Heart failure Mother   . Heart disease Mother 12       CABG, quit 20 years prior o bypass  . Diabetes Maternal Grandmother   . Heart disease Maternal Grandmother   . Arthritis Sister   . Heart disease Maternal Grandfather   . Rheum arthritis Paternal Grandmother   . Heart disease Paternal Grandmother   . Lung disease Paternal Grandfather   . Heart disease Paternal Grandfather     Social History   Tobacco Use  . Smoking status: Never Smoker  . Smokeless tobacco:  Never Used  Vaping Use  . Vaping Use: Never used  Substance Use Topics  . Alcohol use: Yes    Comment: rarely may have 1 wine a month or less  . Drug use: No    Home Medications Prior to Admission medications   Medication Sig Start Date End Date Taking? Authorizing Provider  Calcium Carbonate-Vitamin D 600-400 MG-UNIT tablet Take 1 tablet by mouth daily.    [provider]  dorzolamide-timolol (COSOPT) 22.3-6.8 MG/ML ophthalmic solution INTILL 1 DROP INTO BOTH EYES TWICE A DAY 07/06/18   [provider]  estradiol (ESTRACE) 0.1 MG/GM vaginal cream Place 1 g vaginally once a week. 03/16/19   [provider]  famotidine (PEPCID) 20 MG tablet Take 1 tablet (20 mg total) by mouth  2 (two) times daily. 08/28/20   Esterwood, Amy S, PA-C  fexofenadine (ALLEGRA) 180 MG tablet Take 180 mg by mouth daily as needed.    [provider]  irbesartan (AVAPRO) 75 MG tablet Take 1 tablet (75 mg total) by mouth daily. Patient taking differently: Take 75 mg by mouth daily. Patient taking 1/2 tablet daily 03/17/20   Marin Olp, MD  latanoprost (XALATAN) 0.005 % ophthalmic solution Place 1 drop into both eyes at bedtime. 01/17/20   [provider]  Multiple Vitamin (MULTIVITAMIN) capsule Take 1 capsule by mouth daily.    [provider]  tretinoin (RETIN-A) 0.1 % cream Apply topically at bedtime.    [provider]    Allergies    Prednisolone, Tizanidine, Ace inhibitors, Ciprofloxacin, Codeine phosphate, Kiwi extract, and Latex  Review of Systems   Review of Systems  Constitutional: Negative for chills and fever.  HENT: Negative for congestion and rhinorrhea.   Eyes: Negative for redness and visual disturbance.  Respiratory: Positive for shortness of breath. Negative for wheezing.   Cardiovascular: Positive for chest pain. Negative for palpitations.  Gastrointestinal: Negative for nausea and vomiting.  Genitourinary: Negative for dysuria and urgency.  Musculoskeletal: Negative for arthralgias and myalgias.  Skin: Negative for pallor and wound.  Neurological: Negative for dizziness and headaches.    Physical Exam Updated Vital Signs BP 132/77 (BP Location: Right Arm)   Pulse 88   Temp 98.2 F (36.8 C)   Resp (!) 21   Ht 5\' 4"  (1.626 m)   Wt 69.9 kg   SpO2 97%   BMI 26.43 kg/m   Physical Exam Vitals and nursing note reviewed.  Constitutional:      General: She is not in acute distress.    Appearance: She is well-developed. She is not diaphoretic.  HENT:     Head: Normocephalic and atraumatic.  Eyes:     Pupils: Pupils are equal, round, and reactive to light.  Cardiovascular:     Rate and Rhythm: Normal rate and regular  rhythm.     Heart sounds: No murmur heard. No friction rub. No gallop.   Pulmonary:     Effort: Pulmonary effort is normal.     Breath sounds: No wheezing or rales.  Abdominal:     General: There is no distension.     Palpations: Abdomen is soft.     Tenderness: There is abdominal tenderness.     Comments: Epigastric pain.  Pain along the left chest wall.   Musculoskeletal:        General: No tenderness.     Cervical back: Normal range of motion and neck supple.  Skin:    General: Skin is warm and dry.  Neurological:     Mental Status: She is alert and oriented to person, place, and time.  Psychiatric:        Behavior: Behavior normal.     ED Results / Procedures / Treatments   Labs (all labs ordered are listed, but only abnormal results are displayed) Labs Reviewed  CBC WITH DIFFERENTIAL/PLATELET  COMPREHENSIVE METABOLIC PANEL  LIPASE, BLOOD  D-DIMER, QUANTITATIVE  TROPONIN I (HIGH SENSITIVITY)  TROPONIN I (HIGH SENSITIVITY)    EKG EKG Interpretation  Date/Time:  Tuesday September 02 2020 16:44:55 EDT Ventricular Rate:  82 PR Interval:  164 QRS Duration: 89 QT Interval:  362 QTC Calculation: 423 R Axis:   -23 Text Interpretation: Sinus rhythm Borderline left axis deviation RSR' in V1 or V2, right VCD or RVH No old tracing to compare Confirmed by Deno Etienne [phone]) on 09/02/2020 4:58:53 PM   Radiology DG Chest 2 View  Result Date: 09/02/2020 CLINICAL DATA:  Rib pain, short of breath. Fractured left ribs 2 months ago. EXAM: CHEST - 2 VIEW COMPARISON:  07/16/2020 FINDINGS: Heart size upper normal. Negative for heart failure. Mild left lower lobe atelectasis. Negative for pleural effusion. IMPRESSION: Mild left lower lobe atelectasis. Electronically Signed   By: Franchot Gallo M.D.   On: 09/02/2020 17:41   CT Chest W Contrast  Result Date: 09/02/2020 CLINICAL DATA:  Chest wall pain.  History of previous rib fractures. EXAM: CT CHEST WITH CONTRAST TECHNIQUE:  Multidetector CT imaging of the chest was performed during intravenous contrast administration. CONTRAST:  76mL OMNIPAQUE IOHEXOL 300 MG/ML  SOLN COMPARISON:  Chest radiograph 09/02/2020 FINDINGS: Cardiovascular: Normal heart size. No pericardial effusions. Normal caliber thoracic aorta. No aortic dissection. Great vessel origins are patent. Mediastinum/Nodes: Large esophageal hiatal hernia. Esophagus is decompressed. No significant lymphadenopathy in the chest. Lungs/Pleura: Atelectasis or linear scarring in the lung bases. No focal consolidation or airspace disease. No pleural effusions. No pneumothorax. Airways are patent. Upper Abdomen: No acute abnormalities demonstrated in the visualized upper abdomen. Musculoskeletal: Degenerative changes in the spine. No vertebral compression deformities. Normal alignment of the thoracic spine. Sternum appears intact. Healing fractures of the right anterior third, fourth, fifth, sixth, and seventh ribs. No acute displaced rib fractures. IMPRESSION: 1. Healing fractures of the right anterior third, fourth, fifth, sixth, and seventh ribs. No acute displaced rib fractures. 2. Large esophageal hiatal hernia. 3. Atelectasis or linear scarring in the lung bases. No evidence of active pulmonary disease. Electronically Signed   By: Lucienne Capers M.D.   On: 09/02/2020 19:35    Procedures Procedures   Medications Ordered in ED Medications  sodium chloride 0.9 % bolus 1,000 mL (0 mLs Intravenous Stopped 09/02/20 1853)  morphine 2 MG/ML injection 2 mg (2 mg Intravenous Given 09/02/20 1735)  ondansetron (ZOFRAN) injection 4 mg (4 mg Intravenous Given 09/02/20 1735)  iohexol (OMNIPAQUE) 300 MG/ML solution 75 mL (75 mLs Intravenous Contrast Given 09/02/20 1850)  morphine 4 MG/ML injection 4 mg (4 mg Intravenous Given 09/02/20 1844)  ondansetron (ZOFRAN) injection 4 mg (4 mg Intravenous Given 09/02/20 1843)  ketorolac (TORADOL) 30 MG/ML injection 15 mg (15 mg Intravenous Given  09/02/20 1949)  alum & mag hydroxide-simeth (MAALOX/MYLANTA) 200-200-20 MG/5ML suspension 30 mL (30 mLs Oral Given 09/02/20 1949)    ED Course  I have reviewed the triage vital signs and the nursing notes.  Pertinent labs & imaging results that were available during my care of the patient were reviewed by me and considered in my medical decision making (see  chart for details).    MDM Rules/Calculators/A&P                          75 yo F with a cc of L chest pain.  Has hx of broken rib a few weeks ago.  She denies repeat trauma.  States the chest started hurting a few hours ago.  Pleuritic in nature she also has abdominal tenderness on exam.  Had been complaining of frequent heartburn and dysphagia.  Will obtain a laboratory evaluation.  Treat pain and nausea.  Chest x-ray blood work reassess.  CXR viewed by me without focal infiltrate, obvious fx or ptx.    Blood work unremarkable.  With patient still having severe pain that radiates to her shoulder will obtain a CT scan of the chest.  CT scan of the chest with left-sided atelectasis but no obvious rib fracture or pneumothorax.  She does have a hiatal hernia which she is known.  She is feeling a bit better on my reassessment.  We will give a GI cocktail and a dose of Toradol.  Attempt to send her home with close PCP follow-up.  We will have her return for any worsening.  7:55 PM:  I have discussed the diagnosis/risks/treatment options with the patient and believe the pt to be eligible for discharge home to follow-up with PCP. We also discussed returning to the ED immediately if new or worsening sx occur. We discussed the sx which are most concerning (e.g., sudden worsening pain, fever, inability to tolerate by mouth) that necessitate immediate return. Medications administered to the patient during their visit and any new prescriptions provided to the patient are listed below.  Medications given during this visit Medications  sodium chloride  0.9 % bolus 1,000 mL (0 mLs Intravenous Stopped 09/02/20 1853)  morphine 2 MG/ML injection 2 mg (2 mg Intravenous Given 09/02/20 1735)  ondansetron (ZOFRAN) injection 4 mg (4 mg Intravenous Given 09/02/20 1735)  iohexol (OMNIPAQUE) 300 MG/ML solution 75 mL (75 mLs Intravenous Contrast Given 09/02/20 1850)  morphine 4 MG/ML injection 4 mg (4 mg Intravenous Given 09/02/20 1844)  ondansetron (ZOFRAN) injection 4 mg (4 mg Intravenous Given 09/02/20 1843)  ketorolac (TORADOL) 30 MG/ML injection 15 mg (15 mg Intravenous Given 09/02/20 1949)  alum & mag hydroxide-simeth (MAALOX/MYLANTA) 200-200-20 MG/5ML suspension 30 mL (30 mLs Oral Given 09/02/20 1949)     The patient appears reasonably screen and/or stabilized for discharge and I doubt any other medical condition or other [facility] requiring further screening, evaluation, or treatment in the ED at this time prior to discharge.   Final Clinical Impression(s) / ED Diagnoses Final diagnoses:  Left-sided chest pain    Rx / DC Orders ED Discharge Orders    None       Deno Etienne, DO 09/02/20 1955

## 2020-09-02 NOTE — Telephone Encounter (Signed)
FYI

## 2020-09-02 NOTE — Telephone Encounter (Signed)
Patient already in ED by time I saw this message- we will await report

## 2020-09-02 NOTE — Discharge Instructions (Addendum)
Try to avoid things that may make this worse, most commonly these are spicy foods tomato based products fatty foods chocolate and peppermint.  Alcohol and tobacco can also make this worse.  Return to the emergency department for sudden worsening pain fever or inability to eat or drink.

## 2020-09-02 NOTE — ED Triage Notes (Signed)
Patient reports to the ER for possible rib pain. Patient reports she had an x-ray done in March post fall and was told she had a cracked 6th rib. Patient states it feels like it is re-cracked. Patient states she walked 3 miles yesterday and today is having pain. Patient states it even hurt to wear her bra.

## 2020-09-02 NOTE — ED Notes (Signed)
[name], Son

## 2020-09-10 ENCOUNTER — Ambulatory Visit: Payer: Medicare Other | Admitting: Family Medicine

## 2020-09-10 NOTE — Progress Notes (Deleted)
duplicate

## 2020-09-10 NOTE — Patient Instructions (Addendum)
Use incentive spirometer 10-15 times every 1-2 hours  I also want you to use a heating pad for 15 minutes over the area that hurts 3 times a day for the next 5 days  If symptoms worsen or fail to improve please see [place] back   Recommended follow up: Return in about 6 months (around 03/14/2021) for follow up- or sooner if needed. - make sure to reschedule at the desk

## 2020-09-11 ENCOUNTER — Other Ambulatory Visit: Payer: Self-pay

## 2020-09-11 ENCOUNTER — Ambulatory Visit (INDEPENDENT_AMBULATORY_CARE_PROVIDER_SITE_OTHER): Payer: Medicare Other | Admitting: Family Medicine

## 2020-09-11 ENCOUNTER — Encounter: Payer: Self-pay | Admitting: Family Medicine

## 2020-09-11 VITALS — BP 120/80 | HR 57 | Temp 98.7°F | Ht 64.0 in | Wt 157.8 lb

## 2020-09-11 DIAGNOSIS — I10 Essential (primary) hypertension: Secondary | ICD-10-CM

## 2020-09-11 DIAGNOSIS — K219 Gastro-esophageal reflux disease without esophagitis: Secondary | ICD-10-CM

## 2020-09-11 DIAGNOSIS — R079 Chest pain, unspecified: Secondary | ICD-10-CM | POA: Diagnosis not present

## 2020-09-11 DIAGNOSIS — E785 Hyperlipidemia, unspecified: Secondary | ICD-10-CM | POA: Diagnosis not present

## 2020-09-11 NOTE — Progress Notes (Signed)
Phone [phone] In person visit   Subjective:   Destiny Acosta is a 75 y.o. year old very pleasant female patient who presents for/with See problem oriented charting Chief Complaint  Patient presents with  . Follow-up    Patient was seen in urgent care because she was having pain under her left breast. She was complaining of pain when she breathes. Urgent care did find that a lower portion of her left lung as collapsed.    This visit occurred during the SARS-CoV-2 public health emergency.  Safety protocols were in place, including screening questions prior to the visit, additional usage of staff PPE, and extensive cleaning of exam room while observing appropriate contact time as indicated for disinfecting solutions.   Past Medical History-  Patient Active Problem List   Diagnosis Date Noted  . Spinal stenosis of lumbar region 01/14/2016    Priority: Medium  . Glaucoma 04/16/2015    Priority: Medium  . Suprapubic discomfort 09/17/2014    Priority: Medium  . Erosive osteoarthritis of both hands 08/10/2011    Priority: Medium  . Mild aortic regurgitation 05/14/2010    Priority: Medium  . Hyperlipidemia 12/06/2006    Priority: Medium  . Hypertension 12/06/2006    Priority: Medium  . GERD (gastroesophageal reflux disease) 12/06/2006    Priority: Medium  . Palpitations 04/16/2015    Priority: Low  . Allergic rhinitis 06/10/2014    Priority: Low  . Insomnia 06/10/2014    Priority: Low  . History of adenomatous polyp of colon 12/21/2006    Priority: Low  . Primary open-angle glaucoma 08/30/2016    Medications- reviewed and updated Current Outpatient Medications  Medication Sig Dispense Refill  . Calcium Carbonate-Vitamin D 600-400 MG-UNIT tablet Take 1 tablet by mouth daily.    . dorzolamide-timolol (COSOPT) 22.3-6.8 MG/ML ophthalmic solution INTILL 1 DROP INTO BOTH EYES TWICE A DAY    . estradiol (ESTRACE) 0.1 MG/GM vaginal cream Place 1 g vaginally once a week.    .  famotidine (PEPCID) 20 MG tablet Take 1 tablet (20 mg total) by mouth 2 (two) times daily. 60 tablet 11  . irbesartan (AVAPRO) 75 MG tablet Take 1 tablet (75 mg total) by mouth daily. (Patient taking differently: Take 75 mg by mouth daily. Patient taking 1/2 tablet daily) 90 tablet 3  . latanoprost (XALATAN) 0.005 % ophthalmic solution Place 1 drop into both eyes at bedtime.    . Multiple Vitamin (MULTIVITAMIN) capsule Take 1 capsule by mouth daily.    Marland Kitchen tretinoin (RETIN-A) 0.1 % cream Apply topically at bedtime.    . fexofenadine (ALLEGRA) 180 MG tablet Take 180 mg by mouth daily as needed. (Patient not taking: Reported on 09/11/2020)     No current facility-administered medications for this visit.     Objective:  BP 120/80   Pulse (!) 57   Temp 98.7 F (37.1 C) (Temporal)   Ht 5\' 4"  (1.626 m)   Wt 157 lb 12.8 oz (71.6 kg)   SpO2 98%   BMI 27.09 kg/m  Gen: NAD, resting comfortably CV: RRR, slight murmur at the left upper sternal boarder, no rubs or gallops Tenderness over left lateral chest wall beneath breast Lungs: CTAB no crackles, wheeze, rhonchi Abdomen: soft/nontender/nondistended/normal bowel sounds. No rebound or guarding.  Ext: no edema, +2 PT pulses  Skin: warm, dry Neuro: grossly normal, moves all extremities    Assessment and Plan   ED F/U (left sided chest pain) S: Patient determined to have right rib fracture  on July 16, 2020 when she saw Theresa Duty, PA.  Patient later had left sided chest pain and presented to the emergency room on 09/02/2020-initial chest x-ray only showed atelectasis in addition to known hiatal hernia.  She was given GI cocktail and Toradol with improvement in pain.  she also had a chest CT due to severity of pain which showed healing rib fractures on left with no clear cause for pain. Troponin and D-Dimers  were negative- PE effectively ruled out as well as ACS. Of note no exertional chest pain.   Today she reports,that her symptoms have  improved since her ER visit. She continues to have pain under her left rib region but overall improved, but deep breathing techniques improves her symptoms. She was instructed to use a Volumetric Incentive Spirometer which she has been using inconsistently (we . However she is requesting advice on how to use the device accurately.  A/P: Left-sided chest pain could be musculoskeletal in origin- recommended heating pad trial.  Thankful things are improving over time.  If symptoms fail to improve I want her to follow-up with me.  No exertional element or shortness of breath- also with pain with palpation strongly doubt cardiac- we did not opt to pursue cardiology visit unless she has new or worsening symptoms or they become exertional  For atelectasis- Recommended that she uses the Volumetric Incentive Spirometer every 10-15 times every 1-2 hours and use a heating pad for 15 minutes .   #hypertension S: medication: Irbesartan 75 mg (cough on lisinopril in the past).  Last visit we tried to go down to 37.5 mg to see if the irbesartan was associated with dry mouth-this did not help and patient noted blood pressure increased and dry mouth actually worsened Home readings #s: not checking at home, does not feel like her cuff is reliable BP Readings from Last 3 Encounters:  09/11/20 120/80  09/02/20 132/77  08/28/20 (!) 154/84  A/P: Patient doing well with irbesartan 75 mg-continue current medication   #hyperlipidemia-with family history of CAD in mother at 19  S: Medication: None Coronary CT 10/01/2019 with score of 0  Lab Results  Component Value Date   CHOL 195 09/17/2019   HDL 54.60 09/17/2019   LDLCALC 123 (H) 09/17/2019   LDLDIRECT 119.0 06/01/2017   TRIG 88.0 09/17/2019   CHOLHDL 4 09/17/2019   A/P: Patient was originally due for a visit next week but we opted to push back for 6 months prickly with coronary calcium scoring with score of 0 within a year would be highly unlikely to start statin  anyway-focus on healthy eating/regular exercise   # GERD S: Medication: Omeprazole 40 mg as needed with plan to transition to 20 mg over-the-counter -Patient with flareup of reflux in December 2020. H. pylori negative at that time.  Patient does continue to have issues including some dysphagia at times.  She was transitioned down to famotidine 20 mg twice daily through GI.  She also has endoscopy scheduled in June A/P: GERD overall appears well controlled but still unclear why she is having dysphagia type sensation-I agree with endoscopy-continue current medications for now  Recommended follow PI:RJJOAC in about 6 months (around 03/14/2021) for follow up- or sooner if needed. Future Appointments  Date Time Provider [place]  09/16/2020  2:00 PM [facility] CCM PHARMACIST [facility]  10/29/2020  2:00 PM Irene Shipper, MD [facility]  03/13/2021  1:40 PM Marin Olp, MD [facility]  05/28/2021  8:45 AM  [facility] HEALTH COACH [facility]    Lab/Order associations:   ICD-10-CM   1. Left-sided chest pain  R07.9   2. Primary hypertension  I10   3. Hyperlipidemia, unspecified hyperlipidemia type  E78.5   4. Gastroesophageal reflux disease, unspecified whether esophagitis present  K21.9    I,Alexis Bryant,acting as a scribe for Garret Reddish, MD.,have documented all relevant documentation on the behalf of Garret Reddish, MD,as directed by  Garret Reddish, MD while in the presence of Garret Reddish, MD.  I, Garret Reddish, MD, have reviewed all documentation for this visit. The documentation on 09/11/20 for the exam, diagnosis, procedures, and orders are all accurate and complete.  Return precautions advised.  Garret Reddish, MD

## 2020-09-15 ENCOUNTER — Telehealth: Payer: Self-pay

## 2020-09-15 NOTE — Progress Notes (Addendum)
    Chronic Care Management Pharmacy Assistant   Name: Destiny Acosta  MRN: [id number] DOB: 1945-06-09  Destiny Acosta is an 75 y.o. year old female who presents for her initial CCM visit with the clinical pharmacist.  Reason for Encounter: Chart Prep/IQ   Recent office visits:  09/11/20- Garret Reddish, MD- ED follow up for left sided chest pain, instructed to use incentive spirometer 10-15 times every 1-2 hours, follow up 6 months 0/09/22- Allwardt, Randa Evens, PA-C- seen for rib pain on right side, xray ordered, no follow up noted 06/24/20- Garret Reddish, MD - encounter noted trial of 37.5 mg irbesartan instead of 75 mg as long as BP was under 135/85, referral to allergy, follow up 3 months  Recent consult visits:  09/15/20- Redmond Pulling, Jacqualine Code, Licensed Esthetician (Dermatology)- seen for hyperpigmentation encounter noted Cicalfate and UV Elements, no follow up documented 04/21/22Trellis Paganini, Amy S, PA-C Gertie Fey)- seen for chronic GERD, increased pepsid from 10 mg to 20 mg twice daily, endoscopy scheduled, no follow up noted 07/28/20- Redmond Pulling, Jacqualine Code, Licensed Esthetician (Dermatology)- seen for skin aging, encounter noted Cicalfate and UV Elements, no follow up documented 05/08/20- Redmond Pulling, Jacqualine Code, Licensed Esthetician (Dermatology)- seen for skin aging, encounter noted Cicalfate and UV Elements, no follow up documented  Hospital visits:  Medication Reconciliation was completed by comparing discharge summary, patient's EMR and Pharmacy list, and upon discussion with patient.  Same day discharge on 09/02/20 due to left sided chest pain from [facility] Emergency Dept  All  medications remain the same.    Medications: Outpatient Encounter Medications as of 09/15/2020  Medication Sig  . Calcium Carbonate-Vitamin D 600-400 MG-UNIT tablet Take 1 tablet by mouth daily.  . dorzolamide-timolol (COSOPT) 22.3-6.8 MG/ML ophthalmic solution INTILL 1 DROP INTO  BOTH EYES TWICE A DAY  . estradiol (ESTRACE) 0.1 MG/GM vaginal cream Place 1 g vaginally once a week.  . famotidine (PEPCID) 20 MG tablet Take 1 tablet (20 mg total) by mouth 2 (two) times daily.  . fexofenadine (ALLEGRA) 180 MG tablet Take 180 mg by mouth daily as needed. (Patient not taking: Reported on 09/11/2020)  . irbesartan (AVAPRO) 75 MG tablet Take 1 tablet (75 mg total) by mouth daily. (Patient taking differently: Take 75 mg by mouth daily. Patient taking 1/2 tablet daily)  . latanoprost (XALATAN) 0.005 % ophthalmic solution Place 1 drop into both eyes at bedtime.  . Multiple Vitamin (MULTIVITAMIN) capsule Take 1 capsule by mouth daily.  Marland Kitchen tretinoin (RETIN-A) 0.1 % cream Apply topically at bedtime.   No facility-administered encounter medications on file as of 09/15/2020.   Chart Prep completed, patient cancelled initial CPP visit  Current Documented Medications Calcium Carbonate-Vitamin D 600-400 MG-UNIT tablet dorzolamide-timolol (COSOPT) 22.3-6.8 MG/ML ophthalmic solution- 90 DS last filled 07/04/20 Estradiol 0.1mg /gm  famotidine (PEPCID) 20 MG tablet- 90DS last filled 08/28/20  Fexofenadine 180 mg Irbesartan 75 mg- 90 DS last filled 08/18/20 Latanoprost 0.005%- 90 DS last filled 07/10/20 Multivitamin  Tretinoin 0.1%  [employer] CPA, CMA

## 2020-09-16 ENCOUNTER — Ambulatory Visit: Payer: Medicare Other

## 2020-09-22 ENCOUNTER — Ambulatory Visit: Payer: Medicare Other | Admitting: Family Medicine

## 2020-10-10 DIAGNOSIS — H401132 Primary open-angle glaucoma, bilateral, moderate stage: Secondary | ICD-10-CM | POA: Diagnosis not present

## 2020-10-14 DIAGNOSIS — Z23 Encounter for immunization: Secondary | ICD-10-CM | POA: Diagnosis not present

## 2020-10-29 ENCOUNTER — Encounter: Payer: Self-pay | Admitting: Internal Medicine

## 2020-10-29 ENCOUNTER — Other Ambulatory Visit: Payer: Self-pay

## 2020-10-29 ENCOUNTER — Ambulatory Visit (AMBULATORY_SURGERY_CENTER): Payer: Medicare Other | Admitting: Internal Medicine

## 2020-10-29 VITALS — BP 140/72 | HR 67 | Temp 97.8°F | Resp 12 | Ht 64.0 in | Wt 158.0 lb

## 2020-10-29 DIAGNOSIS — R1310 Dysphagia, unspecified: Secondary | ICD-10-CM

## 2020-10-29 DIAGNOSIS — K2960 Other gastritis without bleeding: Secondary | ICD-10-CM | POA: Diagnosis not present

## 2020-10-29 DIAGNOSIS — K449 Diaphragmatic hernia without obstruction or gangrene: Secondary | ICD-10-CM

## 2020-10-29 DIAGNOSIS — K219 Gastro-esophageal reflux disease without esophagitis: Secondary | ICD-10-CM | POA: Diagnosis not present

## 2020-10-29 DIAGNOSIS — K222 Esophageal obstruction: Secondary | ICD-10-CM

## 2020-10-29 DIAGNOSIS — R1013 Epigastric pain: Secondary | ICD-10-CM

## 2020-10-29 DIAGNOSIS — K30 Functional dyspepsia: Secondary | ICD-10-CM | POA: Diagnosis not present

## 2020-10-29 MED ORDER — SODIUM CHLORIDE 0.9 % IV SOLN: 500.0000 mL | Freq: Once | INTRAVENOUS | Status: DC

## 2020-10-29 MED ORDER — PANTOPRAZOLE SODIUM 40 MG PO TBEC: 40.0000 mg | DELAYED_RELEASE_TABLET | Freq: Every day | ORAL | 3 refills | Status: DC

## 2020-10-29 NOTE — Patient Instructions (Signed)
Take your new medication as ordered.  Read all of the handouts given to you by your recovery room nurse.  YOU HAD AN ENDOSCOPIC PROCEDURE TODAY AT [facility]:   Refer to the procedure report that was given to you for any specific questions about what was found during the examination.  If the procedure report does not answer your questions, please call your gastroenterologist to clarify.  If you requested that your care partner not be given the details of your procedure findings, then the procedure report has been included in a sealed envelope for you to review at your convenience later.  YOU SHOULD EXPECT: Some feelings of bloating in the abdomen. Passage of more gas than usual.  Walking can help get rid of the air that was put into your GI tract during the procedure and reduce the bloating.  Please Note:  You might notice some irritation and congestion in your nose or some drainage.  This is from the oxygen used during your procedure.  There is no need for concern and it should clear up in a day or so.  SYMPTOMS TO REPORT IMMEDIATELY:   Following upper endoscopy (EGD)  Vomiting of blood or coffee ground material  New chest pain or pain under the shoulder blades  Painful or persistently difficult swallowing  New shortness of breath  Fever of 100F or higher  Black, tarry-looking stools  For urgent or emergent issues, a gastroenterologist can be reached at any hour by calling [phone]. Do not use MyChart messaging for urgent concerns.    DIET:  We do recommend clear liquids until 3:30 pm.  A soft diet for today.  Tomorrow you may proceed to your regular diet.  Drink plenty of fluids but you should avoid alcoholic beverages for 24 hours.  ACTIVITY:  You should plan to take it easy for the rest of today and you should NOT DRIVE or use heavy machinery until tomorrow (because of the sedation medicines used during the test).    FOLLOW UP: Our staff will call the number  listed on your records 48-72 hours following your procedure to check on you and address any questions or concerns that you may have regarding the information given to you following your procedure. If we do not reach you, we will leave a message.  We will attempt to reach you two times.  During this call, we will ask if you have developed any symptoms of COVID 19. If you develop any symptoms (ie: fever, flu-like symptoms, shortness of breath, cough etc.) before then, please call [phone].  If you test positive for Covid 19 in the 2 weeks post procedure, please call and report this information to [place].    If any biopsies were taken you will be contacted by phone or by letter within the next 1-3 weeks.  Please call us at [phone] if you have not heard about the biopsies in 3 weeks.    SIGNATURES/CONFIDENTIALITY: You and/or your care partner have signed paperwork which will be entered into your electronic medical record.  These signatures attest to the fact that that the information above on your After Visit Summary has been reviewed and is understood.  Full responsibility of the confidentiality of this discharge information lies with you and/or your care-partner.

## 2020-10-29 NOTE — Progress Notes (Signed)
pt tolerated well. VSS. awake and to recovery. Report given to RN. Bite block left insitu to recovery. No trauma. 

## 2020-10-29 NOTE — Progress Notes (Signed)
Called to room to assist during endoscopic procedure.  Patient ID and intended procedure confirmed with present staff. Received instructions for my participation in the procedure from the performing physician.  

## 2020-10-29 NOTE — Op Note (Signed)
[facility] Patient Name: Destiny Acosta Procedure Date: 10/29/2020 1:58 PM MRN: [id number] Endoscopist: Docia Chuck. Henrene Pastor , MD Age: 75 Referring MD:  Date of Birth: [birth year] Gender: Female Account #: [id number] Procedure:                Upper GI endoscopy with biopsies; Maloney dilation                            of the esophagus. 46 French Indications:              Dysphagia, Esophageal reflux Medicines:                Monitored Anesthesia Care Procedure:                Pre-Anesthesia Assessment:                           - Prior to the procedure, a History and Physical                            was performed, and patient medications and                            allergies were reviewed. The patient's tolerance of                            previous anesthesia was also reviewed. The risks                            and benefits of the procedure and the sedation                            options and risks were discussed with the patient.                            All questions were answered, and informed consent                            was obtained. Prior Anticoagulants: The patient has                            taken no previous anticoagulant or antiplatelet                            agents. ASA Grade Assessment: II - A patient with                            mild systemic disease. After reviewing the risks                            and benefits, the patient was deemed in                            satisfactory condition to undergo the procedure.  After obtaining informed consent, the endoscope was                            passed under direct vision. Throughout the                            procedure, the patient's blood pressure, pulse, and                            oxygen saturations were monitored continuously. The                            Endoscope was introduced through the mouth, and                            advanced to the  second part of duodenum. The upper                            GI endoscopy was accomplished without difficulty.                            The patient tolerated the procedure well. Scope In: Scope Out: Findings:                 One benign-appearing, intrinsic moderate stenosis                            was found 34 cm from the incisors. There was                            associated reflux esophagitis. No Barrett's. This                            stenosis measured 1.4 cm (inner diameter). After                            completing the endoscopic survey, the scope was                            withdrawn. Dilation was then performed with a                            Maloney dilator with no resistance at 30 Fr. The                            dilation site was examined and showed mild mucosal                            disruption.                           The exam of the esophagus was otherwise normal.  The stomach revealed a moderate hiatal hernia. As                            well multiple antral erosions. Biopsies were taken                            with a cold forceps for Helicobacter pylori testing                            using CLOtest.                           The examined duodenum was normal.                           The cardia and gastric fundus were normal on                            retroflexion. Complications:            No immediate complications. Estimated Blood Loss:     Estimated blood loss: none. Impression:               1. GERD with esophagitis and peptic stricture                            status post dilation                           2. Hiatal hernia                           3. Erosive gastritis status post CLO biopsy. Recommendation:           1. Patient has a contact number available for                            emergencies. The signs and symptoms of potential                            delayed complications were discussed  with the                            patient. Return to normal activities tomorrow.                            Written discharge instructions were provided to the                            patient.                           2. Post dilation diet.                           3. Prescribe pantoprazole 40 mg daily; #30; 11  refills.                           4. Await pathology results.                           5. Office follow-up with Dr. Henrene Pastor in 6 weeks Docia Chuck. Henrene Pastor, MD 10/29/2020 2:34:56 PM This report has been signed electronically.

## 2020-10-29 NOTE — Progress Notes (Signed)
VS taken by C.W. 

## 2020-10-30 LAB — HELICOBACTER PYLORI SCREEN-BIOPSY: UREASE: NEGATIVE

## 2020-10-31 ENCOUNTER — Telehealth: Payer: Self-pay

## 2020-10-31 NOTE — Telephone Encounter (Signed)
  Follow up Call-  Call back number 10/29/2020  Post procedure Call Back phone  # [phone]  Permission to leave phone message Yes  Some recent data might be hidden     Patient questions:  Do you have a fever, pain , or abdominal swelling? No. Pain Score  0 *  Have you tolerated food without any problems? Yes.    Have you been able to return to your normal activities? Yes.    Do you have any questions about your discharge instructions: Diet   No. Medications  No. Follow up visit  No.  Do you have questions or concerns about your Care? Yes.  Patient has noticed some minimal swelling in her ankles and abdomen. She is not sure of the cause but is going to keep an eye on it and let us know if it gets worse. She started protonix on Thursday and said that is the only new medication she has started and wondered if that may be it. I told her I had not heard of that but to let us know if it got worse, she agreed.   Actions: * If pain score is 4 or above: No action needed, pain <4.  Have you developed a fever since your procedure? no  2.   Have you had an respiratory symptoms (SOB or cough) since your procedure? no  3.   Have you tested positive for COVID 19 since your procedure no  4.   Have you had any family members/close contacts diagnosed with the COVID 19 since your procedure?  no   If yes to any of these questions please route to Joylene John, RN and Joella Prince, RN

## 2020-11-27 ENCOUNTER — Other Ambulatory Visit: Payer: Medicare Other

## 2020-12-31 ENCOUNTER — Ambulatory Visit: Payer: Medicare Other | Admitting: Internal Medicine

## 2020-12-31 ENCOUNTER — Ambulatory Visit (INDEPENDENT_AMBULATORY_CARE_PROVIDER_SITE_OTHER): Payer: Medicare Other | Admitting: Internal Medicine

## 2020-12-31 ENCOUNTER — Encounter: Payer: Self-pay | Admitting: Internal Medicine

## 2020-12-31 VITALS — BP 120/78 | HR 59 | Ht 64.0 in | Wt 155.0 lb

## 2020-12-31 DIAGNOSIS — K219 Gastro-esophageal reflux disease without esophagitis: Secondary | ICD-10-CM | POA: Diagnosis not present

## 2020-12-31 DIAGNOSIS — R1013 Epigastric pain: Secondary | ICD-10-CM | POA: Diagnosis not present

## 2020-12-31 DIAGNOSIS — K222 Esophageal obstruction: Secondary | ICD-10-CM

## 2020-12-31 MED ORDER — PANTOPRAZOLE SODIUM 40 MG PO TBEC: 40.0000 mg | DELAYED_RELEASE_TABLET | Freq: Every day | ORAL | 3 refills | Status: DC

## 2020-12-31 NOTE — Progress Notes (Signed)
HISTORY OF PRESENT ILLNESS:  Destiny Acosta is a 75 y.o. female with multiple medical problems as listed below, multiple sessile serrated polyps, and chronic worsening reflux with dysphagia for which she was evaluated by the GI physician assistant August 28, 2020.  She has been apprehensive to use PPI therapy with concerns over bone density issues or other side effects.  At the time she was seen in the office on H2 receptor antagonist therapy, she was miserable with reflux symptoms.  In addition dysphagia.  Thus, she underwent upper endoscopy October 29, 2020.  She was found to have acute reflux esophagitis as well as peptic stricture and erosive gastritis.  Testing for Helicobacter pylori was negative.  She was prescribed pantoprazole 40 mg daily.  She was dilated with 54 [place] Maloney dilator.  She presents today for follow-up.  The patient's please report that she has had [place] no reflux symptoms.  No further issues with dysphagia.  She still remains concerned over PPI therapy.  She feels that PPI therapy has resulted in arthritis.  She is known to have arthritis.  She also feels that PPI therapy is responsible for worsening issues with fatigue.  She has multiple questions.  She is hopeful to come off PPI therapy.  REVIEW OF SYSTEMS:  All non-GI ROS negative unless otherwise stated in the HPI except for arthritis, fatigue  Past Medical History:  Diagnosis Date   Allergy    Eggplant/KIWI   Anxiety    Aortal stenosis 05/14/2010   Arthritis    Cancer ([place]) 11/2015   skin cancer forehead; MOHS procedure 11/2015   Cataract    Colon polyps    GERD 12/06/2006   Glaucoma    Heart murmur    aortic valve   HYPERLIPIDEMIA 12/06/2006   Hyperplastic colon polyp    HYPERTENSION 12/06/2006   Lung granuloma ([place])    MITRAL VALVE PROLAPSE 12/06/2006   Osteopenia    Osteoporosis    Spinal stenosis     Past Surgical History:  Procedure Laterality Date   BREAST CYST ASPIRATION     CATARACT  EXTRACTION Right 03/07/2019   TUBAL LIGATION      Social History AHNYLA DARIEN  reports that she has never smoked. She has never used smokeless tobacco. She reports current alcohol use. She reports that she does not use drugs.  family history includes Arthritis in her sister; Atrial fibrillation in her father; Colon cancer (age of onset: 44) in her father; Diabetes in her maternal grandmother and mother; Heart disease in her maternal grandfather, maternal grandmother, paternal grandfather, and paternal grandmother; Heart disease (age of onset: 45) in her mother; Heart failure in her mother; Lung disease in her paternal grandfather; Prostate cancer in her father; Rheum arthritis in her paternal grandmother.  Allergies  Allergen Reactions   Prednisolone Swelling   Tizanidine Anaphylaxis   Ace Inhibitors Cough   Ciprofloxacin Other (See Comments)    Muscle pain   Codeine Phosphate     REACTION: nausea, vomiting   Kiwi Extract Nausea And Vomiting   Tramadol Swelling   Latex Rash       PHYSICAL EXAMINATION: Vital signs: BP 120/78   Pulse (!) 59   Ht '5\' 4"'$  (1.626 m)   Wt 155 lb (70.3 kg)   BMI 26.61 kg/m   Constitutional: generally well-appearing, no acute distress Psychiatric: alert and oriented x3, cooperative Eyes: Anicteric Mouth: Mask Abdomen: Not reexamined Skin: no obvious lesions on visible extremities Neuro: Grossly intact  ASSESSMENT:  1.  GERD complicated by esophagitis and peptic stricture.  Asymptomatic post dilation on PPI 2.  Patient concerns over PPI therapy.  She is worried about side effects.  She feels that her problems with joint pain and fatigue are PPI related. 3.  History of multiple sessile serrated polyps.  Last examination October 2017.   PLAN:  1.  Reflux precautions 2.  The patient will continue her PPI for 1 month.  At that time she wishes to stop her PPI to see if her issues with joint pain and fatigue improved.  Also to see if she has  recurrent reflux symptoms.  If her fatigue and joint pain persist but reflux symptoms recur, then resume PPI.   I reviewed with her in detail the current base of knowledge regarding purported PPI side effects related to long-term use.  We also discussed an alternative PPI as an option.  She understood.  She will contact the office regarding problems or questions. 3.  Pantoprazole refilled.  Again, medication risks reviewed 4.  Surveillance colonoscopy around October 2022.  The patient is aware.  She should receive a recall letter around that time. 5.  Routine GI office follow-up 1 year.  Sooner if needed  A total time of 30 minutes was spent preparing to see the patient, reviewing test, obtaining history, performing medically appropriate exam, counseling the patient regarding her above listed issues, ordering medications, answering multiple questions and addressing concerns, and documenting clinical information in the health record

## 2020-12-31 NOTE — Patient Instructions (Addendum)
If you are age 76 or older, your body mass index should be between 23-30. Your Body mass index is 26.61 kg/m. If this is out of the aforementioned range listed, please consider follow up with your Primary Care Provider.  If you are age 58 or younger, your body mass index should be between 19-25. Your Body mass index is 26.61 kg/m. If this is out of the aformentioned range listed, please consider follow up with your Primary Care Provider.   __________________________________________________________  The [facility] providers would like to encourage you to use [facility] to communicate with providers for non-urgent requests or questions.  Due to long hold times on the telephone, sending your provider a message by [facility] may be a faster and more efficient way to get a response.  Please allow 48 business hours for a response.  Please remember that this is for non-urgent requests.   We have sent the following medications to your pharmacy for you to pick up at your convenience:  Protonix  Please follow up in one year

## 2021-01-19 ENCOUNTER — Telehealth: Payer: Self-pay | Admitting: Pharmacist

## 2021-01-19 NOTE — Chronic Care Management (AMB) (Signed)
    Chronic Care Management Pharmacy Assistant   Name: COREENA TASSY  MRN: [id number] DOB: Dec 31, 1945   Reason for encounter: Reschedule Initial Visit    Medications: Outpatient Encounter Medications as of 01/19/2021  Medication Sig   Calcium Carbonate-Vitamin D 600-400 MG-UNIT tablet Take 1 tablet by mouth daily.   dorzolamide-timolol (COSOPT) 22.3-6.8 MG/ML ophthalmic solution INTILL 1 DROP INTO BOTH EYES TWICE A DAY   estradiol (ESTRACE) 0.1 MG/GM vaginal cream Place 1 g vaginally once a week.   irbesartan (AVAPRO) 75 MG tablet Take 1 tablet (75 mg total) by mouth daily. (Patient taking differently: Take 75 mg by mouth daily. Patient taking 1/2 tablet daily)   latanoprost (XALATAN) 0.005 % ophthalmic solution Place 1 drop into both eyes at bedtime.   Multiple Vitamin (MULTIVITAMIN) capsule Take 1 capsule by mouth daily.   pantoprazole (PROTONIX) 40 MG tablet Take 1 tablet (40 mg total) by mouth daily.   tretinoin (RETIN-A) 0.1 % cream Apply topically at bedtime.   No facility-administered encounter medications on file as of 01/19/2021.     [place]

## 2021-01-22 ENCOUNTER — Telehealth: Payer: Self-pay | Admitting: Internal Medicine

## 2021-01-22 NOTE — Telephone Encounter (Signed)
Dr. Henrene Pastor is off today and tomorrow.  Please advise.  Thanks!!

## 2021-01-22 NOTE — Telephone Encounter (Signed)
Inbound call from pt requesting a call back stating that she is experiencing some side effects from the medication Pantoprazole. She feels like the medication itself has improved her acid reflux but the medication. She was wondering if Dr. Henrene Pastor can go down on her Pantoprazole to '20mg'$  instead of '40mg'$ . Please advise. Thank you.

## 2021-01-23 MED ORDER — PANTOPRAZOLE SODIUM 20 MG PO TBEC: 20.0000 mg | DELAYED_RELEASE_TABLET | Freq: Every day | ORAL | 3 refills | Status: DC

## 2021-01-23 NOTE — Telephone Encounter (Signed)
Can inquire about what sxs she is having that she attributes to PPI ADR. Glad she is o/w having relief of reflux sxs with current medication, which is designed to be both diagnostic and therapeutic. Can trial going to 20 mg dosing and monitor for efficacy. Thanks.

## 2021-01-23 NOTE — Telephone Encounter (Signed)
Spoke with patient who mentioned joint pain (more than what she associates with her arthritis) and some diarrhea; I sent in an rx for Pantoprazole '20mg'$  and she is going to try this for a while to see if she likes it better.  If she has additional problems she will call the office.

## 2021-02-09 ENCOUNTER — Encounter: Payer: Self-pay | Admitting: Internal Medicine

## 2021-02-10 DIAGNOSIS — Z23 Encounter for immunization: Secondary | ICD-10-CM | POA: Diagnosis not present

## 2021-02-13 DIAGNOSIS — Z23 Encounter for immunization: Secondary | ICD-10-CM | POA: Diagnosis not present

## 2021-02-25 ENCOUNTER — Other Ambulatory Visit: Payer: Self-pay | Admitting: Family Medicine

## 2021-03-06 NOTE — Progress Notes (Signed)
Phone [phone] In person visit   Subjective:   Destiny Acosta is a 75 y.o. year old very pleasant female patient who presents for/with See problem oriented charting Chief Complaint  Patient presents with   Follow-up   This visit occurred during the SARS-CoV-2 public health emergency.  Safety protocols were in place, including screening questions prior to the visit, additional usage of staff PPE, and extensive cleaning of exam room while observing appropriate contact time as indicated for disinfecting solutions.   Past Medical History-  Patient Active Problem List   Diagnosis Date Noted   Spinal stenosis of lumbar region 01/14/2016    Priority: Medium    Glaucoma 04/16/2015    Priority: Medium    Suprapubic discomfort 09/17/2014    Priority: Medium    Erosive osteoarthritis of both hands 08/10/2011    Priority: Medium    Mild aortic regurgitation 05/14/2010    Priority: Medium    Hyperlipidemia 12/06/2006    Priority: Medium    Hypertension 12/06/2006    Priority: Medium    GERD (gastroesophageal reflux disease) 12/06/2006    Priority: Medium    Palpitations 04/16/2015    Priority: Low   Allergic rhinitis 06/10/2014    Priority: Low   Insomnia 06/10/2014    Priority: Low   History of adenomatous polyp of colon 12/21/2006    Priority: Low   Primary open-angle glaucoma 08/30/2016    Medications- reviewed and updated Current Outpatient Medications  Medication Sig Dispense Refill   Calcium Carbonate-Vitamin D 600-400 MG-UNIT tablet Take 1 tablet by mouth daily.     dorzolamide-timolol (COSOPT) 22.3-6.8 MG/ML ophthalmic solution INTILL 1 DROP INTO BOTH EYES TWICE A DAY     estradiol (ESTRACE) 0.1 MG/GM vaginal cream Place 1 g vaginally once a week.     irbesartan (AVAPRO) 75 MG tablet TAKE 1 TABLET BY MOUTH EVERY DAY 90 tablet 3   latanoprost (XALATAN) 0.005 % ophthalmic solution Place 1 drop into both eyes at bedtime.     Multiple Vitamin (MULTIVITAMIN) capsule  Take 1 capsule by mouth daily.     pantoprazole (PROTONIX) 20 MG tablet Take 1 tablet (20 mg total) by mouth daily. 30 tablet 3   pantoprazole (PROTONIX) 40 MG tablet Take 1 tablet (40 mg total) by mouth daily. 90 tablet 3   tretinoin (RETIN-A) 0.1 % cream Apply topically at bedtime.     No current facility-administered medications for this visit.     Objective:  BP 126/78   Pulse 77   Temp 98.2 F (36.8 C)   Ht 5' 4.02" (1.626 m)   Wt 155 lb 12.8 oz (70.7 kg)   SpO2 99%   BMI 26.73 kg/m  Gen: NAD, resting comfortably CV: RRR no murmurs rubs or gallops Lungs: CTAB no crackles, wheeze, rhonchi Ext: no edema Skin: warm, dry    Assessment and Plan   #hypertension S: medication: Irbesartan 75 mg every day (cough on lisinopril in the past).  Last visit we tried to go down to 37.5 mg to see if the irbesartan was associated with dry mouth-this did not help and patient noted blood pressure increased and dry mouth actually worsened BP Readings from Last 3 Encounters:  03/13/21 126/78  12/31/20 120/78  10/29/20 140/72  A/P:  Controlled. Continue current medications.     #hyperlipidemia-with family history of CAD in mother at 43  S: Medication: None -Coronary CT 10/01/2019 with score of 0  Lab Results  Component Value Date   CHOL  195 09/17/2019   HDL 54.60 09/17/2019   LDLCALC 123 (H) 09/17/2019   LDLDIRECT 119.0 06/01/2017   TRIG 88.0 09/17/2019   CHOLHDL 4 09/17/2019   A/P: Given reassuring coronary artery calcium scoring-we will remain off statin medication-continue to focus on healthy eating/regular exercise  # GERD/dysphagia with certain foods S: Medication: Omeprazole 40 mg as needed with plan to transition to 20 mg over-the-counter then later pepcid 10 mg BID-eventually patient had to see GI and with worsening symptoms was started back on pantoprazole 40 mg-currently taking this at 20 mg- she seems to think Gi wants her on 40mg  though - avoiding food triggers. Fatty and  spicy foods trigger her -some mental fog on medicine and low energy -prior erosion and dysphagia- dysphagia resolved  -for a number of visits dentist had said mouth is dry - I would ask dentist and eye doctor (reports dry eyes and uses gel) if they are concerned about Sjogren's. Had to use biotene/[facility] brand morning and night- Also takes biotin for hair and nails A/P: GERD sounds better controlled but with prior erosion noted is going to work with GI to see if needs higher level of pantoprazole - some brbpr- going to be getting colonoscopy- thinks constipation/straining   - check b12 with PPI use  # caregiver burden S: Medication:none  - a lot of stress with caring for husband phq2 of 0. No SI.  A/P: so busy with husband not sure she could do counseling. Encouraged her  consider this. Gave info for  behavioral health still    Health Maintenance Due  Topic Date Due   COLONOSCOPY   Patient is already scheduled for GI follow-up later this month or decembr 03/01/2021   Recommended follow up: Return in about 6 months (around 09/10/2021) for follow-up or [place] if needed. Future Appointments  Date Time Provider [place]  04/13/2021 10:00 AM [facility] PREVISIT RM 50 [facility]  04/27/2021  8:00 AM Irene Shipper, MD [facility]  05/28/2021  8:45 AM [facility] HEALTH COACH [facility]   Lab/Order associations:   ICD-10-CM   1. Hyperlipidemia, unspecified hyperlipidemia type  E78.5 CBC with Differential/Platelet    Comprehensive metabolic panel    Lipid panel    2. Essential hypertension  I10     3. Gastroesophageal reflux disease, unspecified whether esophagitis present  K21.9     4. Dysphagia, unspecified type  R13.10     5. Need for immunization against influenza  Z23     6. High risk medication use  Z79.899 Vitamin B12      No orders of the defined types were placed in this encounter.   I,Jada Bradford,acting as a scribe for Garret Reddish, MD.,have  documented all relevant documentation on the behalf of Garret Reddish, MD,as directed by  Garret Reddish, MD while in the presence of Garret Reddish, MD.  I, Garret Reddish, MD, have reviewed all documentation for this visit. The documentation on 03/13/21 for the exam, diagnosis, procedures, and orders are all accurate and complete.  Return precautions advised.  Garret Reddish, MD

## 2021-03-11 ENCOUNTER — Other Ambulatory Visit: Payer: Self-pay | Admitting: Gastroenterology

## 2021-03-13 ENCOUNTER — Other Ambulatory Visit: Payer: Self-pay

## 2021-03-13 ENCOUNTER — Encounter: Payer: Self-pay | Admitting: Family Medicine

## 2021-03-13 ENCOUNTER — Ambulatory Visit (INDEPENDENT_AMBULATORY_CARE_PROVIDER_SITE_OTHER): Payer: Medicare Other | Admitting: Family Medicine

## 2021-03-13 VITALS — BP 126/78 | HR 77 | Temp 98.2°F | Ht 64.02 in | Wt 155.8 lb

## 2021-03-13 DIAGNOSIS — Z79899 Other long term (current) drug therapy: Secondary | ICD-10-CM

## 2021-03-13 DIAGNOSIS — K219 Gastro-esophageal reflux disease without esophagitis: Secondary | ICD-10-CM

## 2021-03-13 DIAGNOSIS — R1310 Dysphagia, unspecified: Secondary | ICD-10-CM

## 2021-03-13 DIAGNOSIS — E785 Hyperlipidemia, unspecified: Secondary | ICD-10-CM | POA: Diagnosis not present

## 2021-03-13 DIAGNOSIS — I10 Essential (primary) hypertension: Secondary | ICD-10-CM

## 2021-03-13 DIAGNOSIS — Z23 Encounter for immunization: Secondary | ICD-10-CM

## 2021-03-13 LAB — CBC WITH DIFFERENTIAL/PLATELET
Basophils Absolute: 0 10*3/uL (ref 0.0–0.1)
Basophils Relative: 0.8 % (ref 0.0–3.0)
Eosinophils Absolute: 0.1 10*3/uL (ref 0.0–0.7)
Eosinophils Relative: 1.5 % (ref 0.0–5.0)
HCT: 39.6 % (ref 36.0–46.0)
Hemoglobin: 13.2 g/dL (ref 12.0–15.0)
Lymphocytes Relative: 34 % (ref 12.0–46.0)
Lymphs Abs: 1.8 10*3/uL (ref 0.7–4.0)
MCHC: 33.4 g/dL (ref 30.0–36.0)
MCV: 93.1 fl (ref 78.0–100.0)
Monocytes Absolute: 0.5 10*3/uL (ref 0.1–1.0)
Monocytes Relative: 9.5 % (ref 3.0–12.0)
Neutro Abs: 2.8 10*3/uL (ref 1.4–7.7)
Neutrophils Relative %: 54.2 % (ref 43.0–77.0)
Platelets: 275 10*3/uL (ref 150.0–400.0)
RBC: 4.26 Mil/uL (ref 3.87–5.11)
RDW: 13.5 % (ref 11.5–15.5)
WBC: 5.2 10*3/uL (ref 4.0–10.5)

## 2021-03-13 LAB — LIPID PANEL
Cholesterol: 206 mg/dL — ABNORMAL HIGH (ref 0–200)
HDL: 60.9 mg/dL (ref >39.00)
LDL Cholesterol: 122 mg/dL — ABNORMAL HIGH (ref 0–99)
NonHDL: 145.34
Total CHOL/HDL Ratio: 3
Triglycerides: 119 mg/dL (ref 0.0–149.0)
VLDL: 23.8 mg/dL (ref 0.0–40.0)

## 2021-03-13 LAB — COMPREHENSIVE METABOLIC PANEL
ALT: 16 U/L (ref 0–35)
AST: 21 U/L (ref 0–37)
Albumin: 4.4 g/dL (ref 3.5–5.2)
Alkaline Phosphatase: 70 U/L (ref 39–117)
BUN: 15 mg/dL (ref 6–23)
CO2: 28 mEq/L (ref 19–32)
Calcium: 10 mg/dL (ref 8.4–10.5)
Chloride: 104 mEq/L (ref 96–112)
Creatinine, Ser: 0.71 mg/dL (ref 0.40–1.20)
GFR: 82.61 mL/min (ref >60.00)
Glucose, Bld: 90 mg/dL (ref 70–99)
Potassium: 4.7 mEq/L (ref 3.5–5.1)
Sodium: 140 mEq/L (ref 135–145)
Total Bilirubin: 0.9 mg/dL (ref 0.2–1.2)
Total Protein: 7.5 g/dL (ref 6.0–8.3)

## 2021-03-13 LAB — VITAMIN B12: Vitamin B-12: 1096 pg/mL — ABNORMAL HIGH (ref 211–911)

## 2021-03-13 NOTE — Patient Instructions (Addendum)
Health Maintenance Due  Topic Date Due   COLONOSCOPY (Pts 45-14yrs Insurance coverage will need to be confirmed) -   Call for your husband:  [facility] contact Address: [address], [place], [place] [address] Phone: [phone]  Also schedule him a lab visit for 1-2 months- I already ordered the labs 03/01/2021   For therapy:  Please call [phone] to schedule a visit with [facility] - please tell the office you were directly referred by Dr. Yong Channel  Please stop by lab before you go If you have mychart- we will send your results within 3 business days of [place] receiving them.  If you do not have mychart- we will call you about results within 5 business days of [place] receiving them.  *please also note that you will see labs on mychart as soon as they post. I will later go in and write notes on them- will say "notes from Dr. Yong Channel"  Recommended follow up: Return in about 6 months (around 09/10/2021) for follow-up or [place] if needed.

## 2021-03-17 DIAGNOSIS — Z1283 Encounter for screening for malignant neoplasm of skin: Secondary | ICD-10-CM | POA: Diagnosis not present

## 2021-03-17 DIAGNOSIS — L814 Other melanin hyperpigmentation: Secondary | ICD-10-CM | POA: Diagnosis not present

## 2021-03-17 DIAGNOSIS — L821 Other seborrheic keratosis: Secondary | ICD-10-CM | POA: Diagnosis not present

## 2021-03-17 DIAGNOSIS — L658 Other specified nonscarring hair loss: Secondary | ICD-10-CM | POA: Diagnosis not present

## 2021-03-17 DIAGNOSIS — Z85828 Personal history of other malignant neoplasm of skin: Secondary | ICD-10-CM | POA: Diagnosis not present

## 2021-03-23 DIAGNOSIS — H401132 Primary open-angle glaucoma, bilateral, moderate stage: Secondary | ICD-10-CM | POA: Diagnosis not present

## 2021-04-13 ENCOUNTER — Other Ambulatory Visit: Payer: Self-pay

## 2021-04-13 ENCOUNTER — Ambulatory Visit (AMBULATORY_SURGERY_CENTER): Payer: Medicare Other

## 2021-04-13 VITALS — Ht 64.0 in | Wt 150.0 lb

## 2021-04-13 DIAGNOSIS — Z86010 Personal history of colonic polyps: Secondary | ICD-10-CM

## 2021-04-13 MED ORDER — PLENVU 140 G PO SOLR: 1.0000 | ORAL | 0 refills | Status: DC

## 2021-04-13 NOTE — Progress Notes (Signed)
Pre visit completed via phone call; Patient verified name, DOB, and address. No egg or soy allergy known to patient  No issues known to pt with past sedation with any surgeries or procedures Patient denies ever being told they had issues or difficulty with intubation  No FH of Malignant Hyperthermia Pt is not on diet pills Pt is not on home 02  Pt is not on blood thinners  Pt denies issues with constipation at this time; patient advised to increase fluids, activity, and may take a stool softener/gentle laxative; advised to take Miralax x 5 days prior to procedure to assist with constipation related to diet changes No A fib or A flutter Pt is fully vaccinated for Covid x 2 + boosters; Medicare Coupon given to pt in [place] today and NO PA's for preps discussed with pt in [place] today  Discussed with pt there will be an out-of-pocket cost for prep and that varies from $0 to 70 +  dollars - pt verbalized understanding  Due to the COVID-19 pandemic we are asking patients to follow certain guidelines in [place] and the [place]   Pt aware of COVID protocols and [facility] guidelines

## 2021-04-22 ENCOUNTER — Other Ambulatory Visit: Payer: Self-pay | Admitting: Gastroenterology

## 2021-04-27 ENCOUNTER — Encounter: Payer: Self-pay | Admitting: Internal Medicine

## 2021-04-27 ENCOUNTER — Other Ambulatory Visit: Payer: Self-pay

## 2021-04-27 ENCOUNTER — Ambulatory Visit (AMBULATORY_SURGERY_CENTER): Payer: Medicare Other | Admitting: Internal Medicine

## 2021-04-27 VITALS — BP 114/70 | HR 71 | Temp 98.0°F | Resp 11 | Ht 64.0 in | Wt 150.0 lb

## 2021-04-27 DIAGNOSIS — K635 Polyp of colon: Secondary | ICD-10-CM

## 2021-04-27 DIAGNOSIS — D122 Benign neoplasm of ascending colon: Secondary | ICD-10-CM

## 2021-04-27 DIAGNOSIS — Z86010 Personal history of colonic polyps: Secondary | ICD-10-CM

## 2021-04-27 MED ORDER — SODIUM CHLORIDE 0.9 % IV SOLN: 500.0000 mL | Freq: Once | INTRAVENOUS | Status: DC

## 2021-04-27 NOTE — Progress Notes (Signed)
PT taken to PACU. Monitors in place. VSS. Report given to RN. 

## 2021-04-27 NOTE — Patient Instructions (Signed)
Handout given for polyps.  YOU HAD AN ENDOSCOPIC PROCEDURE TODAY AT THE [facility]:   Refer to the procedure report that was given to you for any specific questions about what was found during the examination.  If the procedure report does not answer your questions, please call your gastroenterologist to clarify.  If you requested that your care partner not be given the details of your procedure findings, then the procedure report has been included in a sealed envelope for you to review at your convenience later.  YOU SHOULD EXPECT: Some feelings of bloating in the abdomen. Passage of more gas than usual.  Walking can help get rid of the air that was put into your GI tract during the procedure and reduce the bloating. If you had a lower endoscopy (such as a colonoscopy or flexible sigmoidoscopy) you may notice spotting of blood in your stool or on the toilet paper. If you underwent a bowel prep for your procedure, you may not have a normal bowel movement for a few days.  Please Note:  You might notice some irritation and congestion in your nose or some drainage.  This is from the oxygen used during your procedure.  There is no need for concern and it should clear up in a day or so.  SYMPTOMS TO REPORT IMMEDIATELY:   Following lower endoscopy (colonoscopy or flexible sigmoidoscopy):  Excessive amounts of blood in the stool  Significant tenderness or worsening of abdominal pains  Swelling of the abdomen that is new, acute  Fever of 100F or higher  For urgent or emergent issues, a gastroenterologist can be reached at any hour by calling [phone]. Do not use MyChart messaging for urgent concerns.    DIET:  We do recommend a small meal at first, but then you may proceed to your regular diet.  Drink plenty of fluids but you should avoid alcoholic beverages for 24 hours.  ACTIVITY:  You should plan to take it easy for the rest of today and you should NOT DRIVE or use heavy  machinery until tomorrow (because of the sedation medicines used during the test).    FOLLOW UP: Our staff will call the number listed on your records 48-72 hours following your procedure to check on you and address any questions or concerns that you may have regarding the information given to you following your procedure. If we do not reach you, we will leave a message.  We will attempt to reach you two times.  During this call, we will ask if you have developed any symptoms of COVID 19. If you develop any symptoms (ie: fever, flu-like symptoms, shortness of breath, cough etc.) before then, please call [phone].  If you test positive for Covid 19 in the 2 weeks post procedure, please call and report this information to us.    If any biopsies were taken you will be contacted by phone or by letter within the next 1-3 weeks.  Please call us at [phone] if you have not heard about the biopsies in 3 weeks.    SIGNATURES/CONFIDENTIALITY: You and/or your care partner have signed paperwork which will be entered into your electronic medical record.  These signatures attest to the fact that that the information above on your After Visit Summary has been reviewed and is understood.  Full responsibility of the confidentiality of this discharge information lies with you and/or your care-partner. 

## 2021-04-27 NOTE — Progress Notes (Signed)
[place] vital signs. 

## 2021-04-27 NOTE — Progress Notes (Signed)
Called to room to assist during endoscopic procedure.  Patient ID and intended procedure confirmed with present staff. Received instructions for my participation in the procedure from the performing physician.  

## 2021-04-27 NOTE — Op Note (Signed)
[facility] Patient Name: Destiny Acosta Procedure Date: 04/27/2021 8:07 AM MRN: [id number] Endoscopist: Docia Chuck. Henrene Pastor , MD Age: 75 Referring MD:  Date of Birth: 1945-11-22 Gender: Female Account #: [id number] Procedure:                Colonoscopy with cold snare polypectomy x 2 Indications:              High risk colon cancer surveillance: Personal                            history of sessile serrated colon polyp (less than                            10 mm in size) with no dysplasia. Previous                            examinations 2007, 2012, 2017 Medicines:                Monitored Anesthesia Care Procedure:                Pre-Anesthesia Assessment:                           - Prior to the procedure, a History and Physical                            was performed, and patient medications and                            allergies were reviewed. The patient's tolerance of                            previous anesthesia was also reviewed. The risks                            and benefits of the procedure and the sedation                            options and risks were discussed with the patient.                            All questions were answered, and informed consent                            was obtained. Prior Anticoagulants: The patient has                            taken no previous anticoagulant or antiplatelet                            agents. ASA Grade Assessment: II - A patient with                            mild systemic disease. After reviewing the risks  and benefits, the patient was deemed in                            satisfactory condition to undergo the procedure.                           After obtaining informed consent, the colonoscope                            was passed under direct vision. Throughout the                            procedure, the patient's blood pressure, pulse, and                            oxygen  saturations were monitored continuously. The                            CF [id number] #[id number] was introduced through the anus                            and advanced to the the cecum, identified by                            appendiceal orifice and ileocecal valve. The                            ileocecal valve, appendiceal orifice, and rectum                            were photographed. The quality of the bowel                            preparation was good. The colonoscopy was performed                            without difficulty. The patient tolerated the                            procedure well. The bowel preparation used was                            Prepopik via split dose instruction. Scope In: 8:22:38 AM Scope Out: 8:41:10 AM Scope Withdrawal Time: 0 hours 14 minutes 27 seconds  Total Procedure Duration: 0 hours 18 minutes 32 seconds  Findings:                 Two polyps were found in the transverse colon and                            ascending colon. The polyps were 1 to 2 mm in size.                            These polyps were removed  with a cold snare.                            Resection and retrieval were complete.                           Internal hemorrhoids were found during                            retroflexion. The hemorrhoids were small.                           The exam was otherwise without abnormality on                            direct and retroflexion views. Complications:            No immediate complications. Estimated blood loss:                            None. Estimated Blood Loss:     Estimated blood loss: none. Impression:               - Two 1 to 2 mm polyps in the transverse colon and                            in the ascending colon, removed with a cold snare.                            Resected and retrieved.                           - Internal hemorrhoids.                           - The examination was otherwise normal on direct                             and retroflexion views. Recommendation:           - Repeat colonoscopy is not recommended for                            surveillance.                           - Patient has a contact number available for                            emergencies. The signs and symptoms of potential                            delayed complications were discussed with the                            patient. Return to normal activities tomorrow.  Written discharge instructions were provided to the                            patient.                           - Resume previous diet.                           - Continue present medications.                           - Await pathology results. Docia Chuck. Henrene Pastor, MD 04/27/2021 8:50:47 AM This report has been signed electronically.

## 2021-04-27 NOTE — Progress Notes (Signed)
Pt's states no medical or surgical changes since previsit or office visit. 

## 2021-04-27 NOTE — Progress Notes (Signed)
HISTORY OF PRESENT ILLNESS:  Destiny Acosta is a 75 y.o. female with a history of sessile serrated polyps.  Due for surveillance colonoscopy.  No active complaints  REVIEW OF SYSTEMS:  All non-GI ROS negative. Past Medical History:  Diagnosis Date   Allergy    Eggplant/KIWI   Anxiety    not on meds   Aortal stenosis 05/14/2010   Arthritis    Cancer ([place]) 11/2015   skin cancer forehead; MOHS procedure 11/2015   Cataract    Colon polyps    Depression    not on meds   GERD 12/06/2006   on meds   Glaucoma    Heart murmur    aortic valve-not treatment advised (04/13/2021)   HYPERLIPIDEMIA 12/06/2006   diet controlled   Hyperplastic colon polyp    HYPERTENSION 12/06/2006   on meds   Lung granuloma ([place])    MITRAL VALVE PROLAPSE 12/06/2006   Osteopenia    Osteoporosis    Spinal stenosis     Past Surgical History:  Procedure Laterality Date   BREAST CYST ASPIRATION     CATARACT EXTRACTION Bilateral 2020   one done in 2019   COLONOSCOPY  2017   JP-MAC-suprep(good)-SSA x 2, polyp   POLYPECTOMY  2017   SSA x 2   TUBAL LIGATION     WISDOM TOOTH EXTRACTION      Social History Destiny Acosta  reports that she has never smoked. She has never used smokeless tobacco. She reports current alcohol use. She reports that she does not use drugs.  family history includes Arthritis in her sister; Atrial fibrillation in her father; Colon cancer (age of onset: 34) in her father; Colon polyps (age of onset: 64) in her sister; Colon polyps (age of onset: 58) in her father; Diabetes in her maternal grandmother and mother; Heart disease in her maternal grandfather, maternal grandmother, paternal grandfather, and paternal grandmother; Heart disease (age of onset: 80) in her mother; Heart failure in her mother; Lung disease in her paternal grandfather; Prostate cancer in her father; Rheum arthritis in her paternal grandmother.  Allergies  Allergen Reactions   Prednisolone Swelling    Tizanidine Anaphylaxis   Ace Inhibitors Cough   Ciprofloxacin Other (See Comments)    Muscle pain   Codeine Phosphate     REACTION: nausea, vomiting   Kiwi Extract Nausea And Vomiting   Tramadol Swelling   Latex Rash       PHYSICAL EXAMINATION:  Vital signs: BP 124/69    Pulse 77    Temp 98 F (36.7 C)    Resp 14    Ht _0  (1.626 m)    Wt 150 lb (68 kg)    SpO2 100%    BMI 25.75 kg/m  General: Well-developed, well-nourished, no acute distress HEENT: Sclerae are anicteric, conjunctiva pink. Oral mucosa intact Lungs: Clear Heart: Regular Abdomen: soft, nontender, nondistended, no obvious ascites, no peritoneal signs, normal bowel sounds. No organomegaly. Extremities: No edema Psychiatric: alert and oriented x3. Cooperative     ASSESSMENT:  1.  History of multiple sessile serrated polyps.  Due for surveillance   PLAN:   1.  Surveillance colonoscopy

## 2021-04-29 ENCOUNTER — Encounter: Payer: Self-pay | Admitting: Internal Medicine

## 2021-04-29 ENCOUNTER — Telehealth: Payer: Self-pay | Admitting: *Deleted

## 2021-04-29 NOTE — Telephone Encounter (Signed)
°  Follow up Call-  Call back number 04/27/2021 10/29/2020  Post procedure Call Back phone  # [phone]  Permission to leave phone message Yes Yes  Some recent data might be hidden     Patient questions:  Do you have a fever, pain , or abdominal swelling? No. Pain Score  0 *  Have you tolerated food without any problems? Yes.    Have you been able to return to your normal activities? Yes.    Do you have any questions about your discharge instructions: Diet   No. Medications  No. Follow up visit  No.  Do you have questions or concerns about your Care? No.  Actions: * If pain score is 4 or above: No action needed, pain <4.  Have you developed a fever since your procedure? no  2.   Have you had an respiratory symptoms (SOB or cough) since your procedure? no  3.   Have you tested positive for COVID 19 since your procedure no  4.   Have you had any family members/close contacts diagnosed with the COVID 19 since your procedure?  no   If yes to any of these questions please route to Joylene John, RN and Joella Prince, RN

## 2021-05-28 ENCOUNTER — Ambulatory Visit: Payer: Medicare Other

## 2021-05-29 ENCOUNTER — Ambulatory Visit: Payer: Medicare Other

## 2021-06-05 ENCOUNTER — Other Ambulatory Visit: Payer: Self-pay | Admitting: Obstetrics and Gynecology

## 2021-06-05 DIAGNOSIS — Z1231 Encounter for screening mammogram for malignant neoplasm of breast: Secondary | ICD-10-CM

## 2021-07-13 ENCOUNTER — Ambulatory Visit
Admission: RE | Admit: 2021-07-13 | Discharge: 2021-07-13 | Disposition: A | Discharge status: Home / Self Care | Payer: Medicare Other | Source: Ambulatory Visit | Attending: Obstetrics and Gynecology | Admitting: Obstetrics and Gynecology

## 2021-07-13 DIAGNOSIS — Z1231 Encounter for screening mammogram for malignant neoplasm of breast: Secondary | ICD-10-CM | POA: Diagnosis not present

## 2021-07-13 LAB — HM MAMMOGRAPHY: HM Mammogram: NORMAL (ref 0–4)

## 2021-07-22 ENCOUNTER — Telehealth: Payer: Self-pay | Admitting: Family Medicine

## 2021-07-22 NOTE — Progress Notes (Incomplete)
? ?Phone [phone] ?In person visit ?  ?Subjective:  ? ?Destiny Acosta is a 76 y.o. year old very pleasant female patient who presents for/with See problem oriented charting ?No chief complaint on file. ? ? ?This visit occurred during the SARS-CoV-2 public health emergency.  Safety protocols were in place, including screening questions prior to the visit, additional usage of staff PPE, and extensive cleaning of exam room while observing appropriate contact time as indicated for disinfecting solutions.  ? ?Past Medical History-  ?Patient Active Problem List  ? Diagnosis Date Noted  ? Primary open-angle glaucoma 08/30/2016  ? Spinal stenosis of lumbar region 01/14/2016  ? Glaucoma 04/16/2015  ? Palpitations 04/16/2015  ? Suprapubic discomfort 09/17/2014  ? Allergic rhinitis 06/10/2014  ? Insomnia 06/10/2014  ? Erosive osteoarthritis of both hands 08/10/2011  ? Mild aortic regurgitation 05/14/2010  ? History of adenomatous polyp of colon 12/21/2006  ? Hyperlipidemia 12/06/2006  ? Hypertension 12/06/2006  ? GERD (gastroesophageal reflux disease) 12/06/2006  ? ? ?Medications- reviewed and updated ?Current Outpatient Medications  ?Medication Sig Dispense Refill  ? Calcium Carbonate-Vitamin D 600-400 MG-UNIT tablet Take 1 tablet by mouth daily.    ? dorzolamide-timolol (COSOPT) 22.3-6.8 MG/ML ophthalmic solution INTILL 1 DROP INTO BOTH EYES TWICE A DAY    ? estradiol (ESTRACE) 0.1 MG/GM vaginal cream Place 1 g vaginally once a week.    ? irbesartan (AVAPRO) 75 MG tablet TAKE 1 TABLET BY MOUTH EVERY DAY 90 tablet 3  ? latanoprost (XALATAN) 0.005 % ophthalmic solution Place 1 drop into both eyes at bedtime.    ? Multiple Vitamin (MULTIVITAMIN) capsule Take 1 capsule by mouth daily.    ? pantoprazole (PROTONIX) 20 MG tablet TAKE 1 TABLET BY MOUTH EVERY DAY 90 tablet 1  ? pantoprazole (PROTONIX) 40 MG tablet Take 1 tablet (40 mg total) by mouth daily. 90 tablet 3  ? tretinoin (RETIN-A) 0.1 % cream Apply topically at  bedtime.    ? ?No current facility-administered medications for this visit.  ? ?  ?Objective:  ?There were no vitals taken for this visit. ?Gen: NAD, resting comfortably ?CV: RRR no murmurs rubs or gallops ?Lungs: CTAB no crackles, wheeze, rhonchi ?Abdomen: soft/nontender/nondistended/normal bowel sounds. No rebound or guarding.  ?Ext: no edema ?Skin: warm, dry ?Neuro: grossly normal, moves all extremities ? ?*** ?  ? ?Assessment and Plan  ? ?05/22/20 awv *** ? ?***moved into new home 01/2020. had to pretty much do it herself.  ? ?#hypertension ?S: medication: Irbesartan 75 mg (cough on lisinopril in the past) ?Home readings #s: *** ?BP Readings from Last 3 Encounters:  ?04/27/21 114/70  ?03/13/21 126/78  ?12/31/20 120/78  ?A/P: *** ? ?#hyperlipidemia-with family history of CAD in mother at 41 ?S: Medication:***  ?Coronary CT 10/01/2019 with score of 0***  ?Lab Results  ?Component Value Date  ? CHOL 206 (H) 03/13/2021  ? HDL 60.90 03/13/2021  ? LDLCALC 122 (H) 03/13/2021  ? LDLDIRECT 119.0 06/01/2017  ? TRIG 119.0 03/13/2021  ? CHOLHDL 3 03/13/2021  ? A/P: *** ? ?# GERD ?S: Medication: Omeprazole 40 mg as needed with plan to transition to 20 mg over-the-counter ?-Patient with flareup of reflux in December 2020. H. pylori negative at that time  ?B12 levels related to PPI use: ?Lab Results  ?Component Value Date  ? OVZCHYIF02 1,096 (H) 03/13/2021  ? ?A/P: ***  ? ?# Mild aortic regurgitation on echocardiogram 2016-murmur noted ? ?# Glaucoma-follows with ophthalmology  ? ?# Erosive osteoarthritis of both hands-followed  at [facility]. They state not osteoarthritis. Dr. Amil Amen in the past had thought psoriatic arthritis. Had also been followed by Dr. Ouida Sills of rheumatology ? ?#History of adenomatous colon polyp-Serrated adenoma October 2017 with plan for 5-year follow-up ? ?***bilirubin hair high intermittently ? ?There are no preventive care reminders to display for this patient. ? ?Recommended follow up: No follow-ups on  file. ?Future Appointments  ?Date Time Provider [place]  ?09/11/2021 11:00 AM Yong Channel Brayton Mars, MD [facility]  ? ? ?Lab/Order associations: ?No diagnosis found. ? ?No orders of the defined types were placed in this encounter. ? ? ?Return precautions advised.  ?Burnett Corrente ? ? ?

## 2021-07-22 NOTE — Telephone Encounter (Signed)
Copied from [place] [id number]. Topic: Medicare AWV ?>> Jul 22, 2021 10:27 AM Harris-Coley, Hannah Beat wrote: ?Reason for CRM: Left message for patient to schedule Annual Wellness Visit.  Please schedule with Nurse Health Advisor Charlott Rakes, RN at [facility].  Please call [phone] ask for Juliann Pulse ?

## 2021-08-10 DIAGNOSIS — H401132 Primary open-angle glaucoma, bilateral, moderate stage: Secondary | ICD-10-CM | POA: Diagnosis not present

## 2021-08-10 DIAGNOSIS — H5213 Myopia, bilateral: Secondary | ICD-10-CM | POA: Diagnosis not present

## 2021-08-10 DIAGNOSIS — Z961 Presence of intraocular lens: Secondary | ICD-10-CM | POA: Diagnosis not present

## 2021-08-31 ENCOUNTER — Telehealth: Payer: Self-pay | Admitting: Family Medicine

## 2021-08-31 NOTE — Telephone Encounter (Signed)
Spoke with patient she stated to call another time they have too much going on right now.  ?

## 2021-09-11 ENCOUNTER — Encounter: Payer: Self-pay | Admitting: Family Medicine

## 2021-09-11 ENCOUNTER — Ambulatory Visit (INDEPENDENT_AMBULATORY_CARE_PROVIDER_SITE_OTHER): Payer: Medicare Other | Admitting: Family Medicine

## 2021-09-11 ENCOUNTER — Ambulatory Visit: Payer: Medicare Other | Admitting: Family Medicine

## 2021-09-11 VITALS — BP 120/70 | HR 90 | Temp 98.6°F | Ht 64.0 in | Wt 157.8 lb

## 2021-09-11 DIAGNOSIS — H409 Unspecified glaucoma: Secondary | ICD-10-CM

## 2021-09-11 DIAGNOSIS — E785 Hyperlipidemia, unspecified: Secondary | ICD-10-CM | POA: Diagnosis not present

## 2021-09-11 DIAGNOSIS — K219 Gastro-esophageal reflux disease without esophagitis: Secondary | ICD-10-CM

## 2021-09-11 DIAGNOSIS — I10 Essential (primary) hypertension: Secondary | ICD-10-CM | POA: Diagnosis not present

## 2021-09-11 NOTE — Progress Notes (Signed)
?Phone [phone] ?In person visit ?  ?Subjective:  ? ?Destiny Acosta is a 76 y.o. year old very pleasant female patient who presents for/with See problem oriented charting ?Chief Complaint  ?Patient presents with  ? Follow-up  ? Gastroesophageal Reflux  ? Hypertension  ? Hyperlipidemia  ? Cough  ?  Pt c/o cough that she has had for 3 weeks and needs releif, she has taken allegra but no relief.  ? Fatigue  ?  Pt c/o still having extreme fatigue.  ? ? ?Past Medical History-  ?Patient Active Problem List  ? Diagnosis Date Noted  ? Spinal stenosis of lumbar region 01/14/2016  ?  Priority: Medium   ? Glaucoma 04/16/2015  ?  Priority: Medium   ? Suprapubic discomfort 09/17/2014  ?  Priority: Medium   ? Erosive osteoarthritis of both hands 08/10/2011  ?  Priority: Medium   ? Mild aortic regurgitation 05/14/2010  ?  Priority: Medium   ? Hyperlipidemia 12/06/2006  ?  Priority: Medium   ? Hypertension 12/06/2006  ?  Priority: Medium   ? GERD (gastroesophageal reflux disease) 12/06/2006  ?  Priority: Medium   ? Primary open-angle glaucoma 08/30/2016  ?  Priority: Low  ? Palpitations 04/16/2015  ?  Priority: Low  ? Allergic rhinitis 06/10/2014  ?  Priority: Low  ? Insomnia 06/10/2014  ?  Priority: Low  ? History of adenomatous polyp of colon 12/21/2006  ?  Priority: Low  ? ? ?Medications- reviewed and updated ?Current Outpatient Medications  ?Medication Sig Dispense Refill  ? Calcium Carbonate-Vitamin D 600-400 MG-UNIT tablet Take 1 tablet by mouth daily.    ? dorzolamide-timolol (COSOPT) 22.3-6.8 MG/ML ophthalmic solution INTILL 1 DROP INTO BOTH EYES TWICE A DAY    ? estradiol (ESTRACE) 0.1 MG/GM vaginal cream Place 1 g vaginally once a week.    ? irbesartan (AVAPRO) 75 MG tablet TAKE 1 TABLET BY MOUTH EVERY DAY 90 tablet 3  ? latanoprost (XALATAN) 0.005 % ophthalmic solution Place 1 drop into both eyes at bedtime.    ? Multiple Vitamin (MULTIVITAMIN) capsule Take 1 capsule by mouth daily.    ? pantoprazole (PROTONIX) 20  MG tablet TAKE 1 TABLET BY MOUTH EVERY DAY 90 tablet 1  ? pantoprazole (PROTONIX) 40 MG tablet Take 1 tablet (40 mg total) by mouth daily. 90 tablet 3  ? tretinoin (RETIN-A) 0.1 % cream Apply topically at bedtime.    ? ?No current facility-administered medications for this visit.  ? ?  ?Objective:  ?BP 120/70   Pulse 90   Temp 98.6 ?F (37 ?C)   Ht '5\' 4"'$  (1.626 m)   Wt 157 lb 12.8 oz (71.6 kg)   SpO2 97%   BMI 27.09 kg/m?  ?Gen: NAD, resting comfortably ?CV: RRR stable murmur ?Lungs: CTAB no crackles, wheeze, rhonchi ?Ext: no edema ?Skin: warm, dry ?  ? ?Assessment and Plan  ? ?#caregiver burden- caring for husband with dementia is hard ? ?# cough- x 3 weeks/fatigue- ongoing issues ?S:Patient does report cough for the last 3 weeks-Allegra has not been helping significantly.  She also has been feeling some significant fatigue  (she thinks this is related to PPI) ?-husband wakes her up at night when he wets the bed and she has to change linens and get him in shower- it winds her up.  ?A/P: cough she thinks is related to allergies- mild tightness in sinuses and taking allegra. Also has flonase and encouraged to add ?-fatigue ongoing issue- declines labs ? ?#  hypertension ?S: medication: Irbesartan 75 mg (cough on lisinopril in the past) ?BP Readings from Last 3 Encounters:  ?09/11/21 120/70  ?04/27/21 114/70  ?03/13/21 126/78  ?A/P: Controlled. Continue current medications.  ?  ?#hyperlipidemia-with family history of CAD in mother at 109 ?S: Medication: none  ?CT cardiac 10/01/2019 with score of 0  ?-mild weight gain and lipids could be higher ?Lab Results  ?Component Value Date  ? CHOL 206 (H) 03/13/2021  ? HDL 60.90 03/13/2021  ? LDLCALC 122 (H) 03/13/2021  ? LDLDIRECT 119.0 06/01/2017  ? TRIG 119.0 03/13/2021  ? CHOLHDL 3 03/13/2021  ? A/P: cholesterol mildly high but reassuring ct cardiac scoring and no cp or sob- continue without medicine and work on weight loss ?  ?# GERD ?S: Medication: Omeprazole 40 mg  alternating with omeprazole (mainly '20mg'$  but goes to higher dose if needed) ?-Patient with flareup of reflux in December 2020.  H. pylori negative at that time.  ?A/P: reasonably stable- continue current medicine  ? ?#some intense numbness when waking up in middle of night- in both arms- resolved within 3-4 minutes ?  ?Recommended follow up: Return in about 6 months (around 03/14/2022) for followup or sooner if needed.Schedule b4 you leave. ? ?Lab/Order associations: ?  ICD-10-CM   ?1. Primary hypertension  I10   ?  ?2. Hyperlipidemia, unspecified hyperlipidemia type  E78.5   ?  ?3. Gastroesophageal reflux disease, unspecified whether esophagitis present  K21.9   ?  ? ?Return precautions advised.  ?Garret Reddish, MD ? ?

## 2021-09-11 NOTE — Patient Instructions (Addendum)
Glad you are doing well overall! Keep up the great work!  ? ?Recommended follow up: Return in about 6 months (around 03/14/2022) for followup or sooner if needed.Schedule b4 you leave. ?

## 2021-09-23 ENCOUNTER — Telehealth: Payer: Self-pay | Admitting: Family Medicine

## 2021-09-23 NOTE — Telephone Encounter (Signed)
Copied from [place] [id number]. Topic: Medicare AWV ?>> Sep 23, 2021  1:46 PM Harris-Coley, Hannah Beat wrote: ?Reason for CRM: Left message for patient to schedule Annual Wellness Visit.  Please schedule with Nurse Health Advisor Charlott Rakes, RN at [facility].  Please call [phone] ask for Juliann Pulse ?

## 2021-11-19 ENCOUNTER — Other Ambulatory Visit: Payer: Self-pay | Admitting: Internal Medicine

## 2021-11-20 ENCOUNTER — Telehealth: Payer: Self-pay | Admitting: Family Medicine

## 2021-11-20 NOTE — Telephone Encounter (Signed)
Copied from [place] [id number]. Topic: Medicare AWV >> Nov 20, 2021  9:46 AM Devoria Glassing wrote: Reason for CRM: Left message for patient to schedule Annual Wellness Visit.  Please schedule with Nurse Health Advisor Charlott Rakes, RN at [facility]. This appt can be telephone or office visit. Please call [phone] ask for [facility]

## 2021-11-25 ENCOUNTER — Telehealth: Payer: Self-pay | Admitting: Family Medicine

## 2021-11-25 NOTE — Telephone Encounter (Signed)
Patient offered several appointments to schedule AWV. Declined all - stated her schedule was very busy  she stated that she will think about it and call back if she wishes to schedule.

## 2021-11-30 ENCOUNTER — Encounter: Payer: Self-pay | Admitting: Internal Medicine

## 2021-12-08 ENCOUNTER — Ambulatory Visit (INDEPENDENT_AMBULATORY_CARE_PROVIDER_SITE_OTHER): Payer: Medicare Other | Admitting: Family Medicine

## 2021-12-08 ENCOUNTER — Encounter: Payer: Self-pay | Admitting: Family Medicine

## 2021-12-08 VITALS — BP 130/84 | HR 65 | Temp 98.1°F | Ht 64.0 in | Wt 156.7 lb

## 2021-12-08 DIAGNOSIS — M79602 Pain in left arm: Secondary | ICD-10-CM

## 2021-12-08 DIAGNOSIS — G8929 Other chronic pain: Secondary | ICD-10-CM | POA: Diagnosis not present

## 2021-12-08 DIAGNOSIS — I10 Essential (primary) hypertension: Secondary | ICD-10-CM

## 2021-12-08 DIAGNOSIS — M25512 Pain in left shoulder: Secondary | ICD-10-CM

## 2021-12-08 NOTE — Progress Notes (Signed)
Phone [phone] In person visit   Subjective:   Destiny Acosta is a 76 y.o. year old very pleasant female patient who presents for/with See problem oriented charting Chief Complaint  Patient presents with   left arm and shoulder pain    Pt c/o left arm and shoulder pain with tingling and numbness in fingers that started months ago, the tingling has resolved. She is taking Tyleonol morning and night that does help.   Past Medical History-  Patient Active Problem List   Diagnosis Date Noted   Spinal stenosis of lumbar region 01/14/2016    Priority: Medium    Glaucoma 04/16/2015    Priority: Medium    Suprapubic discomfort 09/17/2014    Priority: Medium    Erosive osteoarthritis of both hands 08/10/2011    Priority: Medium    Mild aortic regurgitation 05/14/2010    Priority: Medium    Hyperlipidemia 12/06/2006    Priority: Medium    Hypertension 12/06/2006    Priority: Medium    GERD (gastroesophageal reflux disease) 12/06/2006    Priority: Medium    Primary open-angle glaucoma 08/30/2016    Priority: Low   Palpitations 04/16/2015    Priority: Low   Allergic rhinitis 06/10/2014    Priority: Low   Insomnia 06/10/2014    Priority: Low   History of adenomatous polyp of colon 12/21/2006    Priority: Low    Medications- reviewed and updated Current Outpatient Medications  Medication Sig Dispense Refill   Calcium Carbonate-Vitamin D 600-400 MG-UNIT tablet Take 1 tablet by mouth daily.     dorzolamide-timolol (COSOPT) 22.3-6.8 MG/ML ophthalmic solution INTILL 1 DROP INTO BOTH EYES TWICE A DAY     estradiol (ESTRACE) 0.1 MG/GM vaginal cream Place 1 g vaginally once a week.     irbesartan (AVAPRO) 75 MG tablet TAKE 1 TABLET BY MOUTH EVERY DAY 90 tablet 3   latanoprost (XALATAN) 0.005 % ophthalmic solution Place 1 drop into both eyes at bedtime.     Multiple Vitamin (MULTIVITAMIN) capsule Take 1 capsule by mouth daily.     pantoprazole (PROTONIX) 20 MG tablet TAKE 1  TABLET BY MOUTH EVERY DAY 90 tablet 1   pantoprazole (PROTONIX) 40 MG tablet Take 1 tablet (40 mg total) by mouth daily. 90 tablet 3   tretinoin (RETIN-A) 0.1 % cream Apply topically at bedtime.     No current facility-administered medications for this visit.     Objective:  BP 130/84   Pulse 65   Temp 98.1 F (36.7 C)   Ht '5\' 4"'$  (1.626 m)   Wt 156 lb 11.2 oz (71.1 kg)   SpO2 97%   BMI 26.90 kg/m  Gen: NAD, resting comfortably CV: RRR  Lungs: CTAB no crackles, wheeze, rhonchi  Ext: no edema Skin: warm, dry Positive Neer, Hawkin, empty can test.  Also with pain with pushoff from her back as well as mild weakness related to the pain    Assessment and Plan   #Left arm and shoulder pain S: Patient has experienced left arm and shoulder pain also associated with numbness and tingling into the fingers starting over a year ago but worsening in recent months- hurts anywhere from the shoulder down to the wrist. Some tightness into the neck- Certain stretches tend to help relieve discomfort. She reports thankfully the numbness in her fingers and tingling has resolved.  For pain she is taking Tylenol in the morning and night does help some.  Pain up to 8/10 at its  worst and tylenol brings down to 1-2/10.  -No recent cervical spine imaging but did have thoracic spine imaging August 2019 with moderate arthritis. No history of shoulder issues -has tried rest/ice - notes some weakness with pushup from the back -no chest pain or shortness of breath and no exertional element related to the left arm or shoulder pain A/P: Most of her pain is from her left shoulder down to about her elbow and seems to focus most in the shoulder-multiple signs of impingement-concern for bursitis or rotator cuff tendinopathy.  Discussed trial of prednisone and physical therapy versus referral to orthopedics for their opinion-she has seen [facility] in the past and would like to start there-referral was placed today -I  think she may have previously been having more cervical radiculopathy issues-was having some pain going down her wrist as well as numbness and tingling but that has improved and Sp[place]ling negative-monitor only  #Right thumb pain-also notes some IP joint pain and inflammation in the right IP joint.  She would like to discuss this with [facility] as well-has needed injections in the past-encouraged her to trial Voltaren gel until visit  #ongoing fatigue issues- premier protein helps- between meals-she would like to continue this-we discussed potential benefits versus risks   #hypertension S: medication: Irbesartan 75 mg (cough on lisinopril in the past) BP Readings from Last 3 Encounters:  12/08/21 130/84  09/11/21 120/70  04/27/21 114/70   A/P:  Controlled. Continue current medications.     Recommended follow up: Return for as needed for new, worsening, persistent symptoms. Future Appointments  Date Time Provider [place]  03/15/2022 10:20 AM Marin Olp, MD [facility]    Lab/Order associations:   ICD-10-CM   1. Chronic left shoulder pain  M25.512 AMB referral to orthopedics   G89.29     2. Left arm pain  M79.602 AMB referral to orthopedics    3. Primary hypertension  I10       Time Spent: 31 minutes of total time (9:45 AM- 10:16 AM) was spent on the date of the encounter performing the following actions: chart review prior to seeing the patient, obtaining history, performing a medically necessary exam, counseling on the treatment plan options as well as ultimate plan, placing orders, and documenting in our EHR.    Return precautions advised.  Garret Reddish, MD

## 2021-12-08 NOTE — Patient Instructions (Addendum)
Health Maintenance Due  Topic Date Due   INFLUENZA VACCINE - we should have these available within a month or two but please let us know if you get at outside pharmacy 12/08/2021   We will call you within two weeks about your referral to [facility]. If you do not hear within 2 weeks, give [place] a call.    For finger for next 7 days try voltaren 4x a day  Recommended follow up: Return for as needed for new, worsening, persistent symptoms.

## 2021-12-10 ENCOUNTER — Encounter: Payer: Self-pay | Admitting: Family Medicine

## 2021-12-15 DIAGNOSIS — H401132 Primary open-angle glaucoma, bilateral, moderate stage: Secondary | ICD-10-CM | POA: Diagnosis not present

## 2021-12-23 DIAGNOSIS — M542 Cervicalgia: Secondary | ICD-10-CM | POA: Diagnosis not present

## 2021-12-23 DIAGNOSIS — M25512 Pain in left shoulder: Secondary | ICD-10-CM | POA: Diagnosis not present

## 2022-02-01 ENCOUNTER — Encounter: Payer: Self-pay | Admitting: *Deleted

## 2022-02-09 DIAGNOSIS — Z23 Encounter for immunization: Secondary | ICD-10-CM | POA: Diagnosis not present

## 2022-02-11 DIAGNOSIS — Z23 Encounter for immunization: Secondary | ICD-10-CM | POA: Diagnosis not present

## 2022-02-12 ENCOUNTER — Encounter: Payer: Self-pay | Admitting: Internal Medicine

## 2022-02-12 ENCOUNTER — Ambulatory Visit (INDEPENDENT_AMBULATORY_CARE_PROVIDER_SITE_OTHER): Payer: Medicare Other | Admitting: Internal Medicine

## 2022-02-12 ENCOUNTER — Telehealth: Payer: Self-pay | Admitting: Family Medicine

## 2022-02-12 VITALS — BP 118/78 | HR 91 | Ht 63.0 in | Wt 150.4 lb

## 2022-02-12 DIAGNOSIS — Z86010 Personal history of colonic polyps: Secondary | ICD-10-CM

## 2022-02-12 DIAGNOSIS — K219 Gastro-esophageal reflux disease without esophagitis: Secondary | ICD-10-CM | POA: Diagnosis not present

## 2022-02-12 DIAGNOSIS — K222 Esophageal obstruction: Secondary | ICD-10-CM | POA: Diagnosis not present

## 2022-02-12 DIAGNOSIS — R1013 Epigastric pain: Secondary | ICD-10-CM

## 2022-02-12 MED ORDER — PANTOPRAZOLE SODIUM 40 MG PO TBEC: 40.0000 mg | DELAYED_RELEASE_TABLET | Freq: Every day | ORAL | 3 refills | Status: DC

## 2022-02-12 NOTE — Telephone Encounter (Signed)
Patient requests PCP's recommendation on if she should get the RSV vaccine. Pt requests a call back with response at [phone].

## 2022-02-12 NOTE — Progress Notes (Signed)
HISTORY OF PRESENT ILLNESS:  Destiny Acosta is a 76 y.o. female with multiple medical problems as listed below.  She has been followed in this office for history of colon polyps and GERD complicated by esophagitis and symptomatic peptic stricture requiring esophageal dilation.  She last underwent upper endoscopy with esophageal dilation (54 [place] Maloney dilator) June 2022.  She last underwent surveillance colonoscopy December 2022.  She has aged out of surveillance with her last exam revealing only 2 diminutive non-adenomatous polyps.  Last evaluated in the office December 31, 2020.  See that dictation for details.  At that time she was on pantoprazole for GERD.  She presents today for follow-up.  Patient tells me that she has been trying to wean off of her PPI.  She went from pantoprazole 40 mg daily with complete control of symptoms to 20 mg daily with breakthrough symptoms with dietary indiscretion.  She subsequently went to 20 mg every other day with worsening breakthrough reflux and some dysphagia.  She heard about reports that PPIs may cause dementia.  She was hoping to get off of her medication.  We had this discussion last year.  She has multiple reasonable questions.  REVIEW OF SYSTEMS:  All non-GI ROS negative unless otherwise stated in the HPI except for arthritis, back pain, fatigue, headaches, itching, sleeping problems  Past Medical History:  Diagnosis Date   Allergy    Eggplant/KIWI   Anxiety    not on meds   Aortal stenosis 05/14/2010   Arthritis    Cancer ([place]) 11/2015   skin cancer forehead; MOHS procedure 11/2015   Cataract    Colon polyps    Depression    not on meds   GERD 12/06/2006   on meds   Glaucoma    Heart murmur    aortic valve-not treatment advised (04/13/2021)   HYPERLIPIDEMIA 12/06/2006   diet controlled   Hyperplastic colon polyp    HYPERTENSION 12/06/2006   on meds   Lung granuloma ([facility])    MITRAL VALVE PROLAPSE 12/06/2006   Osteopenia     Osteoporosis    Spinal stenosis     Past Surgical History:  Procedure Laterality Date   BREAST CYST ASPIRATION     CATARACT EXTRACTION Bilateral 2020   one done in 2019   COLONOSCOPY  2017   JP-MAC-suprep(good)-SSA x 2, polyp   POLYPECTOMY  2017   SSA x 2   TUBAL LIGATION     WISDOM TOOTH EXTRACTION      Social History SHEKINAH PITONES  reports that she has never smoked. She has never used smokeless tobacco. She reports current alcohol use. She reports that she does not use drugs.  family history includes Arthritis in her sister; Atrial fibrillation in her father; Colon cancer (age of onset: 55) in her father; Colon polyps (age of onset: 60) in her sister; Colon polyps (age of onset: 69) in her father; Diabetes in her maternal grandmother and mother; Heart disease in her maternal grandfather, maternal grandmother, paternal grandfather, and paternal grandmother; Heart disease (age of onset: 5) in her mother; Heart failure in her mother; Lung disease in her paternal grandfather; Prostate cancer in her father; Rheum arthritis in her paternal grandmother.  Allergies  Allergen Reactions   Prednisolone Swelling   Tizanidine Anaphylaxis   Ace Inhibitors Cough   Ciprofloxacin Other (See Comments)    Muscle pain   Codeine Phosphate     REACTION: nausea, vomiting   Kiwi Extract Nausea And Vomiting  Tramadol Swelling   Latex Rash       PHYSICAL EXAMINATION: Vital signs: Ht _0  (1.6 m)   Wt 150 lb 6.4 oz (68.2 kg)   BMI 26.64 kg/m   Constitutional: generally well-appearing, no acute distress Psychiatric: alert and oriented x3, cooperative Eyes: extraocular movements intact, anicteric, conjunctiva pink Mouth: oral pharynx moist, no lesions Neck: supple no lymphadenopathy Cardiovascular: heart regular rate and rhythm, no murmur Lungs: clear to auscultation bilaterally Abdomen: soft, nontender, nondistended, no obvious ascites, no peritoneal signs, normal bowel sounds, no  organomegaly Rectal: Omitted Extremities: no clubbing, cyanosis, or lower extremity edema bilaterally Skin: no lesions on visible extremities Neuro: No focal deficits. No asterixis.     ASSESSMENT:  1.  GERD complicated by peptic stricture requiring esophageal dilation (June 2022). 2.  History of sessile serrated polyps.  Multiple prior colonoscopies.  Last examination December 2022.  Aged out of surveillance   PLAN:  1.  Reflux precautions 2.  Long discussion on GERD complicated by esophagitis and stricturing 3.  Long discussion regarding PPIs and current knowledge base regarding possible long-term untoward effects.  I shared with her that our society is recently released strong information refuting the notion that PPIs are causal with relation to dementia. 4.  I encouraged her to take pantoprazole 40 mg daily for maximal symptom relief and maximal esophageal protection. 5.  Pantoprazole 40 mg daily.  Refilled.  Medication risk again reviewed. 6.  Routine office follow-up 1 year A total time of 30 minutes was spent preparing to see the patient, obtaining history, performing medically appropriate physical examination, counseling and educating the patient regarding the above listed issues (majority of the time), ordering medication, and documenting clinical information in the health record

## 2022-02-12 NOTE — Patient Instructions (Signed)
_______________________________________________________  If you are age 77 or older, your body mass index should be between 23-30. Your Body mass index is 26.64 kg/m. If this is out of the aforementioned range listed, please consider follow up with your Primary Care Provider.  If you are age 51 or younger, your body mass index should be between 19-25. Your Body mass index is 26.64 kg/m. If this is out of the aformentioned range listed, please consider follow up with your Primary Care Provider.   ________________________________________________________  The [facility] providers would like to encourage you to use [facility] to communicate with providers for non-urgent requests or questions.  Due to long hold times on the telephone, sending your provider a message by [facility] may be a faster and more efficient way to get a response.  Please allow 48 business hours for a response.  Please remember that this is for non-urgent requests.  _______________________________________________________  We have sent the following medications to your pharmacy for you to pick up at your convenience:  Pantoprazole '40mg'$   Please follow up in one year

## 2022-02-12 NOTE — Telephone Encounter (Signed)
Called and lm on pt vm tcb, if pt calls back YES she can get the RSV vaccine and let us know when she has gotten it.

## 2022-03-02 DIAGNOSIS — M25512 Pain in left shoulder: Secondary | ICD-10-CM | POA: Diagnosis not present

## 2022-03-15 ENCOUNTER — Encounter: Payer: Self-pay | Admitting: Family Medicine

## 2022-03-15 ENCOUNTER — Ambulatory Visit (INDEPENDENT_AMBULATORY_CARE_PROVIDER_SITE_OTHER): Payer: Medicare Other | Admitting: Family Medicine

## 2022-03-15 VITALS — BP 128/78 | HR 68 | Temp 98.1°F | Ht 63.0 in | Wt 150.0 lb

## 2022-03-15 DIAGNOSIS — E785 Hyperlipidemia, unspecified: Secondary | ICD-10-CM | POA: Diagnosis not present

## 2022-03-15 DIAGNOSIS — Z79899 Other long term (current) drug therapy: Secondary | ICD-10-CM

## 2022-03-15 DIAGNOSIS — K219 Gastro-esophageal reflux disease without esophagitis: Secondary | ICD-10-CM

## 2022-03-15 DIAGNOSIS — I10 Essential (primary) hypertension: Secondary | ICD-10-CM | POA: Diagnosis not present

## 2022-03-15 NOTE — Patient Instructions (Addendum)
Health Maintenance Due  Topic Date Due   Medicare Annual Wellness (AWV)  05/22/2021  You are eligible to schedule your annual wellness visit with our nurse specialist Otila Kluver.  Please consider scheduling this before you leave today  Schedule a lab visit at the check out desk within 2 weeks. Return for future fasting labs meaning nothing but water after midnight please. Ok to take your medications with water.   Recommended follow up: Return in about 6 months (around 09/13/2022) for followup or sooner if needed.Schedule b4 you leave.

## 2022-03-15 NOTE — Progress Notes (Signed)
Phone [phone] In person visit   Subjective:   Destiny Acosta is a 76 y.o. year old very pleasant female patient who presents for/with See problem oriented charting Chief Complaint  Patient presents with   Hypertension   Hyperlipidemia   Past Medical History-  Patient Active Problem List   Diagnosis Date Noted   Spinal stenosis of lumbar region 01/14/2016    Priority: Medium    Glaucoma 04/16/2015    Priority: Medium    Suprapubic discomfort 09/17/2014    Priority: Medium    Erosive osteoarthritis of both hands 08/10/2011    Priority: Medium    Mild aortic regurgitation 05/14/2010    Priority: Medium    Hyperlipidemia 12/06/2006    Priority: Medium    Hypertension 12/06/2006    Priority: Medium    GERD (gastroesophageal reflux disease) 12/06/2006    Priority: Medium    Primary open-angle glaucoma 08/30/2016    Priority: Low   Palpitations 04/16/2015    Priority: Low   Allergic rhinitis 06/10/2014    Priority: Low   Insomnia 06/10/2014    Priority: Low   History of adenomatous polyp of colon 12/21/2006    Priority: Low    Medications- reviewed and updated Current Outpatient Medications  Medication Sig Dispense Refill   Calcium Carbonate-Vitamin D 600-400 MG-UNIT tablet Take 1 tablet by mouth daily.     dorzolamide-timolol (COSOPT) 22.3-6.8 MG/ML ophthalmic solution INTILL 1 DROP INTO BOTH EYES TWICE A DAY     estradiol (ESTRACE) 0.1 MG/GM vaginal cream Place 1 g vaginally once a week.     irbesartan (AVAPRO) 75 MG tablet TAKE 1 TABLET BY MOUTH EVERY DAY 90 tablet 3   latanoprost (XALATAN) 0.005 % ophthalmic solution Place 1 drop into both eyes at bedtime.     Multiple Vitamin (MULTIVITAMIN) capsule Take 1 capsule by mouth daily.     pantoprazole (PROTONIX) 40 MG tablet Take 1 tablet (40 mg total) by mouth daily. 90 tablet 3   tretinoin (RETIN-A) 0.1 % cream Apply topically at bedtime.     No current facility-administered medications for this visit.      Objective:  BP 128/78   Pulse 68   Temp 98.1 F (36.7 C)   Ht '5\' 3"'$  (1.6 m)   SpO2 96%   BMI 26.64 kg/m  Gen: NAD, resting comfortably CV: RRR stable murmur Lungs: CTAB no crackles, wheeze, rhonchi Ext: trace edema Skin: warm, dry     Assessment and Plan   #social update- husband only knows her 50% of time with dementia and also irritable . Misses at toilet and has to mop up  -looking at memory care possibly  #hypertension S: medication: Irbesartan 75 mg (cough on lisinopril in the past) Home readings #s: no recent checks BP Readings from Last 3 Encounters:  03/15/22 128/78  02/12/22 118/78  12/08/21 130/84  A/P: Controlled. Continue current medications.    #hyperlipidemia-with family history of CAD in mother at 100 S: Medication: none Coronary CT calcium 10/01/2019 with score of 0  Lab Results  Component Value Date   CHOL 206 (H) 03/13/2021   HDL 60.90 03/13/2021   LDLCALC 122 (H) 03/13/2021   LDLDIRECT 119.0 06/01/2017   TRIG 119.0 03/13/2021   CHOLHDL 3 03/13/2021   A/P: lipids mildly elevated but with reassuring ct calcium scoring hold off on meds   # GERD- follows with Dr. Henrene Pastor S: Medication: pantoprazole 40 mg -Patient with flareup of reflux in December 2020.  H. pylori negative at  that time (prior omeprazole).  A/P: Controlled. Continue current medications.  - he prefers her to stay on higher dose   #Fatigue- does better with premier protein -also will check b12 with long term PPI use - some mild weight loss but has had to be very busy with caring for husband- less sedentary and a lot of stress reducing intake  % Mild aortic regurgitation on echocardiogram 2016-murmur noted and stable in 2019- no chest pain or shortness of breath or dizziness. Wants to hold off on repeat  % Glaucoma-follows with ophthalmology   #shoulder pain- saw ortho in August and has had 2 shots with Dr. Gladstone Lighter- upcoming potential MRI- not clear if shoulder or neck   Recommended  follow up: Return in about 6 months (around 09/13/2022) for followup or sooner if needed.Schedule b4 you leave.  Lab/Order associations: NOT fasting   ICD-10-CM   1. Primary hypertension  I10     2. Hyperlipidemia, unspecified hyperlipidemia type  E78.5 CBC with Differential/Platelet    Comprehensive metabolic panel    Lipid panel    3. Gastroesophageal reflux disease, unspecified whether esophagitis present  K21.9      No orders of the defined types were placed in this encounter.  Return precautions advised.  Garret Reddish, MD

## 2022-03-16 ENCOUNTER — Other Ambulatory Visit (INDEPENDENT_AMBULATORY_CARE_PROVIDER_SITE_OTHER): Payer: Medicare Other

## 2022-03-16 DIAGNOSIS — E785 Hyperlipidemia, unspecified: Secondary | ICD-10-CM

## 2022-03-16 DIAGNOSIS — Z79899 Other long term (current) drug therapy: Secondary | ICD-10-CM

## 2022-03-16 LAB — LIPID PANEL
Cholesterol: 197 mg/dL (ref 0–200)
HDL: 66 mg/dL (ref >39.00)
LDL Cholesterol: 115 mg/dL — ABNORMAL HIGH (ref 0–99)
NonHDL: 131.49
Total CHOL/HDL Ratio: 3
Triglycerides: 84 mg/dL (ref 0.0–149.0)
VLDL: 16.8 mg/dL (ref 0.0–40.0)

## 2022-03-16 LAB — COMPREHENSIVE METABOLIC PANEL
ALT: 17 U/L (ref 0–35)
AST: 17 U/L (ref 0–37)
Albumin: 4 g/dL (ref 3.5–5.2)
Alkaline Phosphatase: 60 U/L (ref 39–117)
BUN: 13 mg/dL (ref 6–23)
CO2: 27 mEq/L (ref 19–32)
Calcium: 9.1 mg/dL (ref 8.4–10.5)
Chloride: 104 mEq/L (ref 96–112)
Creatinine, Ser: 0.56 mg/dL (ref 0.40–1.20)
GFR: 88.04 mL/min (ref >60.00)
Glucose, Bld: 80 mg/dL (ref 70–99)
Potassium: 3.8 mEq/L (ref 3.5–5.1)
Sodium: 139 mEq/L (ref 135–145)
Total Bilirubin: 0.9 mg/dL (ref 0.2–1.2)
Total Protein: 6.5 g/dL (ref 6.0–8.3)

## 2022-03-16 LAB — CBC WITH DIFFERENTIAL/PLATELET
Basophils Absolute: 0 10*3/uL (ref 0.0–0.1)
Basophils Relative: 0.8 % (ref 0.0–3.0)
Eosinophils Absolute: 0.1 10*3/uL (ref 0.0–0.7)
Eosinophils Relative: 1.5 % (ref 0.0–5.0)
HCT: 38.5 % (ref 36.0–46.0)
Hemoglobin: 12.8 g/dL (ref 12.0–15.0)
Lymphocytes Relative: 21.8 % (ref 12.0–46.0)
Lymphs Abs: 1.3 10*3/uL (ref 0.7–4.0)
MCHC: 33.4 g/dL (ref 30.0–36.0)
MCV: 94.5 fl (ref 78.0–100.0)
Monocytes Absolute: 0.5 10*3/uL (ref 0.1–1.0)
Monocytes Relative: 8.4 % (ref 3.0–12.0)
Neutro Abs: 4 10*3/uL (ref 1.4–7.7)
Neutrophils Relative %: 67.5 % (ref 43.0–77.0)
Platelets: 295 10*3/uL (ref 150.0–400.0)
RBC: 4.07 Mil/uL (ref 3.87–5.11)
RDW: 14 % (ref 11.5–15.5)
WBC: 5.9 10*3/uL (ref 4.0–10.5)

## 2022-03-16 LAB — VITAMIN B12: Vitamin B-12: 835 pg/mL (ref 211–911)

## 2022-03-23 DIAGNOSIS — Z1283 Encounter for screening for malignant neoplasm of skin: Secondary | ICD-10-CM | POA: Diagnosis not present

## 2022-03-23 DIAGNOSIS — L908 Other atrophic disorders of skin: Secondary | ICD-10-CM | POA: Diagnosis not present

## 2022-03-23 DIAGNOSIS — Z85828 Personal history of other malignant neoplasm of skin: Secondary | ICD-10-CM | POA: Diagnosis not present

## 2022-03-23 DIAGNOSIS — L814 Other melanin hyperpigmentation: Secondary | ICD-10-CM | POA: Diagnosis not present

## 2022-03-23 DIAGNOSIS — L821 Other seborrheic keratosis: Secondary | ICD-10-CM | POA: Diagnosis not present

## 2022-03-23 DIAGNOSIS — L658 Other specified nonscarring hair loss: Secondary | ICD-10-CM | POA: Diagnosis not present

## 2022-03-26 DIAGNOSIS — R3915 Urgency of urination: Secondary | ICD-10-CM | POA: Diagnosis not present

## 2022-03-26 DIAGNOSIS — Z01419 Encounter for gynecological examination (general) (routine) without abnormal findings: Secondary | ICD-10-CM | POA: Diagnosis not present

## 2022-04-09 ENCOUNTER — Encounter: Payer: Self-pay | Admitting: Family Medicine

## 2022-04-09 DIAGNOSIS — M25512 Pain in left shoulder: Secondary | ICD-10-CM | POA: Diagnosis not present

## 2022-04-15 ENCOUNTER — Ambulatory Visit (INDEPENDENT_AMBULATORY_CARE_PROVIDER_SITE_OTHER): Payer: Medicare Other

## 2022-04-15 VITALS — BP 120/68 | HR 93 | Temp 97.8°F | Wt 151.4 lb

## 2022-04-15 DIAGNOSIS — Z0000 Encounter for general adult medical examination without abnormal findings: Secondary | ICD-10-CM

## 2022-04-15 NOTE — Patient Instructions (Signed)
Destiny Acosta , Thank you for taking time to come for your Medicare Wellness Visit. I appreciate your ongoing commitment to your health goals. Please review the following plan we discussed and let me know if I can assist you in the future.   These are the goals we discussed:  Goals      Patient Stated     Reduce acid reflux symptoms       Patient Stated     Get back to exercise         This is a list of the screening recommended for you and due dates:  Health Maintenance  Topic Date Due   COVID-19 Vaccine (7 - 2023-24 season) 04/08/2022   Medicare Annual Wellness Visit  04/16/2023   DTaP/Tdap/Td vaccine (3 - Td or Tdap) 04/19/2023   Colon Cancer Screening  04/27/2026   Pneumonia Vaccine  Completed   Flu Shot  Completed   DEXA scan (bone density measurement)  Completed   Hepatitis C Screening: USPSTF Recommendation to screen - Ages 38-79 yo.  Completed   Zoster (Shingles) Vaccine  Completed   HPV Vaccine  Aged Out    Advanced directives: Please bring a copy of your health care power of attorney and living will to the office at your convenience.  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 What does preventive care include? A yearly physical exam. This is also called an annual well check. Dental exams once or twice a year. Routine eye exams. Ask your health care provider how often you should have your eyes checked. Personal lifestyle choices, including: Daily care of your teeth and gums. Regular physical activity. Eating a healthy diet. Avoiding tobacco and drug use. Limiting alcohol use. Practicing safe sex. Taking low-dose aspirin every day. Taking vitamin and mineral supplements as recommended by  your health care provider. What happens during an annual well check? The services and screenings done by your health care provider during your annual well check will depend on your age, overall health, lifestyle risk factors, and family history of disease. Counseling  Your health care provider may ask you questions about your: Alcohol use. Tobacco use. Drug use. Emotional well-being. Home and relationship well-being. Sexual activity. Eating habits. History of falls. Memory and ability to understand (cognition). Work and work Statistician. Reproductive health. Screening  You may have the following tests or measurements: Height, weight, and BMI. Blood pressure. Lipid and cholesterol levels. These may be checked every 5 years, or more frequently if you are over 13 years old. Skin check. Lung cancer screening. You may have this screening every year starting at age 47 if you have a 30-pack-year history of smoking and currently smoke or have quit within the past 15 years. Fecal occult blood test (FOBT) of the stool. You may have this test every year starting at age 49. Flexible sigmoidoscopy or colonoscopy. You may have a sigmoidoscopy every 5 years or a colonoscopy every 10 years starting at age 25. Hepatitis C blood test. Hepatitis B blood test. Sexually transmitted disease (STD) testing. Diabetes screening. This is done by checking your blood sugar (glucose) after you have not eaten for a while (fasting). You may have this done every 1-3 years. Bone density scan. This is  done to screen for osteoporosis. You may have this done starting at age 13. Mammogram. This may be done every 1-2 years. Talk to your health care provider about how often you should have regular mammograms. Talk with your health care provider about your test results, treatment options, and if necessary, the need for more tests. Vaccines  Your health care provider may recommend certain vaccines, such as: Influenza  vaccine. This is recommended every year. Tetanus, diphtheria, and acellular pertussis (Tdap, Td) vaccine. You may need a Td booster every 10 years. Zoster vaccine. You may need this after age 65. Pneumococcal 13-valent conjugate (PCV13) vaccine. One dose is recommended after age 19. Pneumococcal polysaccharide (PPSV23) vaccine. One dose is recommended after age 71. Talk to your health care provider about which screenings and vaccines you need and how often you need them. This information is not intended to replace advice given to you by your health care provider. Make sure you discuss any questions you have with your health care provider. Document Released: 05/23/2015 Document Revised: 01/14/2016 Document Reviewed: 02/25/2015 Elsevier Interactive Patient Education  2017 [employer] Prevention in the Home Falls can cause injuries. They can happen to people of all ages. There are many things you can do to make your home safe and to help prevent falls. What can I do on the outside of my home? Regularly fix the edges of walkways and driveways and fix any cracks. Remove anything that might make you trip as you walk through a door, such as a raised step or threshold. Trim any bushes or trees on the path to your home. Use bright outdoor lighting. Clear any walking paths of anything that might make someone trip, such as rocks or tools. Regularly check to see if handrails are loose or broken. Make sure that both sides of any steps have handrails. Any raised decks and porches should have guardrails on the edges. Have any leaves, snow, or ice cleared regularly. Use sand or salt on walking paths during winter. Clean up any spills in your garage right away. This includes oil or grease spills. What can I do in the bathroom? Use night lights. Install grab bars by the toilet and in the tub and shower. Do not use towel bars as grab bars. Use non-skid mats or decals in the tub or shower. If you  need to sit down in the shower, use a plastic, non-slip stool. Keep the floor dry. Clean up any water that spills on the floor as soon as it happens. Remove soap buildup in the tub or shower regularly. Attach bath mats securely with double-sided non-slip rug tape. Do not have throw rugs and other things on the floor that can make you trip. What can I do in the bedroom? Use night lights. Make sure that you have a light by your bed that is easy to reach. Do not use any sheets or blankets that are too big for your bed. They should not hang down onto the floor. Have a firm chair that has side arms. You can use this for support while you get dressed. Do not have throw rugs and other things on the floor that can make you trip. What can I do in the kitchen? Clean up any spills right away. Avoid walking on wet floors. Keep items that you use a lot in easy-to-reach places. If you need to reach something above you, use a strong step stool that has a grab bar. Keep electrical cords out of  the way. Do not use floor polish or wax that makes floors slippery. If you must use wax, use non-skid floor wax. Do not have throw rugs and other things on the floor that can make you trip. What can I do with my stairs? Do not leave any items on the stairs. Make sure that there are handrails on both sides of the stairs and use them. Fix handrails that are broken or loose. Make sure that handrails are as long as the stairways. Check any carpeting to make sure that it is firmly attached to the stairs. Fix any carpet that is loose or worn. Avoid having throw rugs at the top or bottom of the stairs. If you do have throw rugs, attach them to the floor with carpet tape. Make sure that you have a light switch at the top of the stairs and the bottom of the stairs. If you do not have them, ask someone to add them for you. What else can I do to help prevent falls? Wear shoes that: Do not have high heels. Have rubber  bottoms. Are comfortable and fit you well. Are closed at the toe. Do not wear sandals. If you use a stepladder: Make sure that it is fully opened. Do not climb a closed stepladder. Make sure that both sides of the stepladder are locked into place. Ask someone to hold it for you, if possible. Clearly mark and make sure that you can see: Any grab bars or handrails. First and last steps. Where the edge of each step is. Use tools that help you move around (mobility aids) if they are needed. These include: Canes. Walkers. Scooters. Crutches. Turn on the lights when you go into a dark area. Replace any light bulbs as soon as they burn out. Set up your furniture so you have a clear path. Avoid moving your furniture around. If any of your floors are uneven, fix them. If there are any pets around you, be aware of where they are. Review your medicines with your doctor. Some medicines can make you feel dizzy. This can increase your chance of falling. Ask your doctor what other things that you can do to help prevent falls. This information is not intended to replace advice given to you by your health care provider. Make sure you discuss any questions you have with your health care provider. Document Released: 02/20/2009 Document Revised: 10/02/2015 Document Reviewed: 05/31/2014 Elsevier Interactive Patient Education  2017 [employer].

## 2022-04-15 NOTE — Progress Notes (Signed)
Subjective:   Destiny Acosta is a 76 y.o. female who presents for Medicare Annual (Subsequent) preventive examination.  Review of Systems     Cardiac Risk Factors include: advanced age (>36mn, >>25women);dyslipidemia;hypertension     Objective:    Today's Vitals   04/15/22 1249  BP: 120/68  Pulse: 93  Temp: 97.8 F (36.6 C)  SpO2: 99%  Weight: 151 lb 6.4 oz (68.7 kg)   Body mass index is 26.82 kg/m.     04/15/2022    1:14 PM 09/02/2020    4:41 PM 05/22/2020    9:12 AM 02/01/2019    3:24 PM 01/03/2018    1:36 PM 03/01/2016    9:55 AM 02/18/2016    8:22 AM  Advanced Directives  Does Patient Have a Medical Advance Directive? Yes No _0   Type of AParamedicof AMount AiryLiving will  H[place]iliLiving will Living will [place]Living will Living will;Healthcare Power of [place]  Does patient want to make changes to medical advance directive?    No - Patient declined No - Patient declined    Copy of [place]in Chart? No - copy requested  No - copy requested  No - copy requested    Would patient like information on creating a medical advance directive?  No - Patient declined         Current Medications (verified) Outpatient Encounter Medications as of 04/15/2022  Medication Sig   Calcium Carbonate-Vitamin D 600-400 MG-UNIT tablet Take 1 tablet by mouth daily.   dorzolamide-timolol (COSOPT) 22.3-6.8 MG/ML ophthalmic solution INTILL 1 DROP INTO BOTH EYES TWICE A DAY   estradiol (ESTRACE) 0.1 MG/GM vaginal cream Place 1 g vaginally once a week.   irbesartan (AVAPRO) 75 MG tablet TAKE 1 TABLET BY MOUTH EVERY DAY   latanoprost (XALATAN) 0.005 % ophthalmic solution Place 1 drop into both eyes at bedtime.   Multiple Vitamin (MULTIVITAMIN) capsule Take 1 capsule by mouth daily.   pantoprazole (PROTONIX) 40 MG tablet Take 1 tablet (40 mg total) by  mouth daily.   tretinoin (RETIN-A) 0.1 % cream Apply topically at bedtime. (Patient not taking: Reported on 04/15/2022)   No facility-administered encounter medications on file as of 04/15/2022.    Allergies (verified) Prednisolone, Tizanidine, Silicone, Ace inhibitors, Ciprofloxacin, Codeine phosphate, Kiwi extract, Tramadol, and Latex   History: Past Medical History:  Diagnosis Date   Allergy    Eggplant/KIWI   Anxiety    not on meds   Aortal stenosis 05/14/2010   Arthritis    [place]   skin cancer forehead; MOHS procedure 11/2015   Cataract    Colon polyps    Depression    not on meds   GERD 12/06/2006   on meds   Glaucoma    Heart murmur    aortic valve-not treatment advised (04/13/2021)   HYPERLIPIDEMIA 12/06/2006   diet controlled   Hyperplastic colon polyp    HYPERTENSION 12/06/2006   on meds   [place]    MITRAL VALVE PROLAPSE 12/06/2006   Osteopenia    Osteoporosis    Spinal stenosis    Past Surgical History:  Procedure Laterality Date   BREAST CYST ASPIRATION     CATARACT EXTRACTION Bilateral 2020   one done in 2019   COLONOSCOPY  2017   JP-MAC-suprep(good)-SSA x 2, polyp   POLYPECTOMY  2017   SSA x 2  TUBAL LIGATION     WISDOM TOOTH EXTRACTION     Family History  Problem Relation Age of Onset   Diabetes Mother    Heart failure Mother    Heart disease Mother 30       CABG, quit 18 years prior o bypass   Colon polyps Father 38   Colon cancer Father 35   Atrial fibrillation Father    Prostate cancer Father    Colon polyps Sister 73   Arthritis Sister    Diabetes Maternal Grandmother    Heart disease Maternal Grandmother    Heart disease Maternal Grandfather    Rheum arthritis Paternal Grandmother    Heart disease Paternal Grandmother    Lung disease Paternal Grandfather    Heart disease Paternal Grandfather    Esophageal cancer Neg Hx    Rectal cancer Neg Hx    Stomach cancer Neg Hx    Social History    Socioeconomic History   Marital status: Married    Spouse name: Not on file   Number of children: Not on file   Years of education: Not on file   Highest education level: Not on file  Occupational History   Not on file  Tobacco Use   Smoking status: Never   Smokeless tobacco: Never  Vaping Use   Vaping Use: Never used  Substance and Sexual Activity   Alcohol use: Yes    Comment: rarely may have 1 wine a month or less   Drug use: No   Sexual activity: Not Currently  Other Topics Concern   Not on file  Social History Narrative   Married 1967. 1 son, lives in [place]. 1 grandson in [place] age 35.       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       Hobbies: family time, walking 2.5-3 miles daily, moviews    Social Determinants of Health   Financial Resource Strain: Low Risk  (04/15/2022)   Overall Financial Resource Strain (CARDIA)    Difficulty of Paying Living Expenses: Not hard at all  Food Insecurity: No Food Insecurity (04/15/2022)   Hunger Vital Sign    Worried About Running Out of Food in the Last Year: Never true    Ran Out of Food in the Last Year: Never true  Transportation Needs: No Transportation Needs (04/15/2022)   PRAPARE - Hydrologist (Medical): No    Lack of Transportation (Non-Medical): No  Physical Activity: Inactive (04/15/2022)   Exercise Vital Sign    Days of Exercise per Week: 0 days    Minutes of Exercise per Session: 0 min  Stress: Stress Concern Present (04/15/2022)   [place]    Feeling of Stress : Rather much  Social Connections: Moderately Isolated (04/15/2022)   Social Connection and Isolation Panel [NHANES]    Frequency of Communication with Friends and Family: Three times a week    Frequency of Social Gatherings with Friends and Family: Twice a week    Attends Religious Services: Never    Marine scientist or  Organizations: No    Attends Music therapist: Never    Marital Status: Married    Tobacco Counseling Counseling given: Not Answered   Clinical Intake:  Pre-visit preparation completed: Yes  Pain : No/denies pain     BMI - recorded: 26.82 Nutritional Status: BMI 25 -29 Overweight Nutritional Risks: None Diabetes: No  How often  do you need to have someone help you when you read instructions, pamphlets, or other written materials from your doctor or pharmacy?: 1 - Never  Diabetic?no  Interpreter Needed?: No  Information entered by :: Charlott Rakes, LPN   Activities of Daily Living    04/15/2022    1:16 PM  In your present state of health, do you have any difficulty performing the following activities:  Hearing? 1  Comment slight hearing loss  Vision? 0  Difficulty concentrating or making decisions? 0  Walking or climbing stairs? 0  Dressing or bathing? 0  Doing errands, shopping? 0  Preparing Food and eating ? N  Using the Toilet? N  In the past six months, have you accidently leaked urine? N  Do you have problems with loss of bowel control? N  Managing your Medications? N  Managing your Finances? N  Housekeeping or managing your Housekeeping? N    Patient Care Team: Marin Olp, MD as PCP - General (Family Medicine) Terrall Laity, MD as Consulting Physician (Dermatology) Lucy Antigua, MD as Consulting Physician (Rheumatology) Jola Schmidt, MD as Consulting Physician (Ophthalmology) Madelin Rear, [facility] (Inactive) as Pharmacist (Pharmacist)  Indicate any recent Medical Services you may have received from other than [facility] providers in the past year (date may be approximate).     Assessment:   This is a routine wellness examination for Shepherd.  Hearing/Vision screen Hearing Screening - Comments:: Pt stated slight hearing loss  Vision Screening - Comments:: Pt follows up with Dr Valetta Close for annul eye exams   Dietary issues  and exercise activities discussed: Current Exercise Habits: The patient does not participate in regular exercise at present   Goals Addressed             This Visit's Progress    Patient Stated       Be more calm with changes of husbands health        Depression Screen    04/15/2022    1:09 PM 03/15/2022   10:31 AM 03/13/2021    1:49 PM 09/11/2020   10:29 AM 05/22/2020    9:08 AM 12/14/2019   10:04 AM 10/24/2019   10:50 AM  PHQ 2/9 Scores  PHQ - 2 Score 1 0 0 0 0 0 0  PHQ- 9 Score    0  2 0    Fall Risk    04/15/2022    1:16 PM 03/15/2022   10:31 AM 03/13/2021    1:49 PM 09/11/2020   10:26 AM 07/16/2020   10:13 AM  Fall Risk   Falls in the past year? 0 0 0 1 1  Number falls in past yr: 0 0 0 0 0  Injury with Fall? 0 0 0 1   Risk for fall due to : Impaired vision No Fall Risks     Follow up Falls prevention discussed Falls evaluation completed       FALL RISK PREVENTION PERTAINING TO THE HOME:  Any stairs in or around the home? Yes  If so, are there any without handrails? No  Home free of loose throw rugs in walkways, pet beds, electrical cords, etc? Yes  Adequate lighting in your home to reduce risk of falls? Yes   ASSISTIVE DEVICES UTILIZED TO PREVENT FALLS:  Life alert? No  Use of a cane, walker or w/c? No  Grab bars in the bathroom? No  Shower chair or bench in shower? Yes Elevated toilet seat or a handicapped toilet? No  TIMED UP AND GO:  Was the test performed? Yes .  Length of time to ambulate 10 feet: 10 sec.   Gait steady and fast without use of assistive device  Cognitive Function:        05/22/2020    9:17 AM 02/01/2019    3:31 PM 01/03/2018    1:56 PM  6CIT Screen  What Year? 0 points 0 points 0 points  What month? 0 points 0 points 0 points  What time?  0 points 0 points  Count back from 20 0 points 0 points 0 points  Months in reverse 0 points 0 points 0 points  Repeat phrase 0 points 0 points   Total Score  0 points      Immunizations Immunization History  Administered Date(s) Administered   Covid-19 Iv Non-us Vaccine (Bibp, Sinopharm) 02/11/2022   Fluad Quad(high Dose 65+) 12/27/2018, 01/18/2020, 02/10/2022   Influenza Split 02/08/2011, 02/08/2012   Influenza Whole 03/02/2007, 02/20/2008   Influenza, High Dose Seasonal PF 01/14/2016, 02/02/2017, 01/13/2018   Influenza,inj,Quad PF,6+ Mos 02/09/2013, 01/18/2014, 02/06/2015, 02/07/2017   Influenza-Unspecified 02/27/2004, 02/08/2022   PFIZER Comirnaty(Gray Top)Covid-19 Tri-Sucrose Vaccine 02/09/2022   PFIZER(Purple Top)SARS-COV-2 Vaccination 05/27/2019, 06/17/2019, 01/04/2020, 10/14/2020   Pfizer Covid-19 Vaccine Bivalent Booster 46yr & up 02/13/2021   Pneumococcal Conjugate-13 06/10/2014   Pneumococcal Polysaccharide-23 11/17/2007, 02/08/2011   Respiratory Syncytial Virus Vaccine,Recomb Aduvanted(Arexvy) 02/09/2022, 02/28/2022   Td 05/10/2001   Td (Adult), 2 Lf Tetanus Toxid, Preservative Free 05/10/2001   Tdap 04/18/2013   Zoster Recombinat (Shingrix) 02/16/2019, 04/20/2019   Zoster, Live 12/20/2007    TDAP status: Up to date  Flu Vaccine status: Up to date  Pneumococcal vaccine status: Up to date  Covid-19 vaccine status: Completed vaccines  Qualifies for Shingles Vaccine? Yes   Zostavax completed Yes   Shingrix Completed?: Yes  Screening Tests Health Maintenance  Topic Date Due   COVID-19 Vaccine (7 - 2023-24 season) 04/08/2022   Medicare Annual Wellness (AWV)  04/16/2023   DTaP/Tdap/Td (4 - Td or Tdap) 04/19/2023   COLONOSCOPY (Pts 45-410yrInsurance coverage will need to be confirmed)  04/27/2026   Pneumonia Vaccine 6563Years old  Completed   INFLUENZA VACCINE  Completed   DEXA SCAN  Completed   Hepatitis C Screening  Completed   Zoster Vaccines- Shingrix  Completed   HPV VACCINES  Aged Out    Health Maintenance  Health Maintenance Due  Topic Date Due   COVID-19 Vaccine (7 - 2023-24 season) 04/08/2022    Colorectal  cancer screening: Type of screening: Colonoscopy. Completed 04/27/21. Repeat every 5 years  Mammogram status: Completed 07/13/21. Repeat every year  Bone Density status: Completed 07/12/20. Results reflect: Bone density results: OSTEOPENIA. Repeat every 2 years.   Additional Screening:  Hepatitis C Screening:  Completed 05/09/15  Vision Screening: Recommended annual ophthalmology exams for early detection of glaucoma and other disorders of the eye. Is the patient up to date with their annual eye exam?  Yes  Who is the provider or what is the name of the office in which the patient attends annual eye exams? Dr BoValetta CloseIf pt is not established with a provider, would they like to be referred to a provider to establish care? No .   Dental Screening: Recommended annual dental exams for proper oral hygiene  Community Resource Referral / Chronic Care Management: CRR required this visit?  No   CCM required this visit?  No      Plan:     I  have personally reviewed and noted the following in the patient's chart:   Medical and social history Use of alcohol, tobacco or illicit drugs  Current medications and supplements including opioid prescriptions. Patient is not currently taking opioid prescriptions. Functional ability and status Nutritional status Physical activity Advanced directives List of other physicians Hospitalizations, surgeries, and ER visits in previous 12 months Vitals Screenings to include cognitive, depression, and falls Referrals and appointments  In addition, I have reviewed and discussed with patient certain preventive protocols, quality metrics, and best practice recommendations. A written personalized care plan for preventive services as well as general preventive health recommendations were provided to patient.     Willette Brace, LPN   [id number]/[id number]   Nurse Notes: none

## 2022-04-20 DIAGNOSIS — H401131 Primary open-angle glaucoma, bilateral, mild stage: Secondary | ICD-10-CM | POA: Diagnosis not present

## 2022-05-03 ENCOUNTER — Other Ambulatory Visit: Payer: Self-pay | Admitting: Family Medicine

## 2022-05-06 ENCOUNTER — Telehealth: Payer: Self-pay | Admitting: Family Medicine

## 2022-05-06 NOTE — Telephone Encounter (Signed)
Pt advised to be seen by provider. No appts available. Advised to go to UC.  Patient Name: Destiny Acosta IRSW Gender: Female DOB: 1945-12-03 Age: 76 Y 33 M 15 D Return Phone Number: [phone] (Primary) Address: City/ State/ Zip: [place]  [address] Client [facility] Client Site [place] [place] Night Provider Garret Reddish- MD Contact Type Call Who Is Calling Patient / Member / Family / Caregiver Call Type Triage / Clinical Relationship To Patient Self Return Phone Number [phone] (Primary) Chief Complaint Headache Reason for Call Symptomatic / Request for [place] states she believes she has the flu. States she is aching all over her body and a very bad headache. State she has had on and off fever and stuffy and runny nose. Translation No Nurse Assessment Nurse: Lucky Cowboy, RN, Levada Dy Date/Time (Eastern Time): 05/06/2022 7:09:22 AM Confirm and document reason for call. If symptomatic, describe symptoms. ---Caller stated that she thinks she has the flu. She has had s/s since 12/26--stuffy/runny nose, fever, headache, body aches. Doesn't know what her temp is. Does the patient have any new or worsening symptoms? ---Yes Will a triage be completed? ---Yes Related visit to physician within the last 2 weeks? ---No Does the PT have any chronic conditions? (i.e. diabetes, asthma, this includes High risk factors for pregnancy, etc.) ---Yes List chronic conditions. ---HTN, glaucoma Is this a behavioral health or substance abuse call? ---No Guidelines Guideline Title Affirmed Question Affirmed Notes Nurse Date/Time (Eastern Time) Influenza (Flu) - Seasonal Patient is HIGH RISK (e.g., age > 17 years, pregnant, HIV +, or chronic medical condition) Dew, RN, Levada Dy 05/06/2022 7:12:57 AM Disp. Time Eilene Ghazi Time) Disposition Final User 05/06/2022 7:24:24 AM Call PCP within 24  Hours Yes Lucky Cowboy, RN, Levada Dy Final Disposition 05/06/2022 7:24:24 AM Call PCP within 24 Hours Yes Dew, RN, Marin Shutter Disagree/Comply Comply Caller Understands Yes PreDisposition Call Doctor Care Advice Given Per Guideline CALL PCP WITHIN 24 HOURS: * IF OFFICE WILL BE OPEN: Call the office when it opens tomorrow morning. * You need to discuss this with your doctor (or NP/PA) within the next 24 hours. INFLUENZA - GENERAL CARE ADVICE: * Cough: Use cough drops. * Feeling dehydrated: Drink extra liquids. If the air in your home is dry, use a humidifier. * Fever: For fever over 101 F (38.3 C), take acetaminophen every 4 to 6 hours (Adults 650 mg) OR ibuprofen every 6 to 8 hours (Adults 400 mg). * Muscle aches, headache, and other pains: Often this comes and goes with the fever. Take acetaminophen every 4 to 6 hours (Adults 650 mg) OR ibuprofen every 6 to 8 hours (Adults 400 mg). * Sore throat: Try throat lozenges, hard candy or warm chicken broth. NASAL WASHES FOR A STUFFY NOSE: * Introduction: Saline (salt water) nasal irrigation (nasal wash) is an effective and simple home remedy for treating stuffy nose and sinus congestion. The nose can be irrigated by pouring, spraying, or squirting salt water into the nose and then letting it run back out. CALL BACK IF: * Fever lasts over 3 days * Runny nose lasts over 10 days * Cough lasts over 3 weeks * Difficulty breathing occurs * You become worse CARE ADVICE given per Influenza - Seasonal (Adult) guideline. Referrals REFERRED TO PCP OFFICE

## 2022-05-20 ENCOUNTER — Other Ambulatory Visit: Payer: Self-pay | Admitting: Internal Medicine

## 2022-05-25 DIAGNOSIS — M79642 Pain in left hand: Secondary | ICD-10-CM | POA: Diagnosis not present

## 2022-05-25 DIAGNOSIS — M19012 Primary osteoarthritis, left shoulder: Secondary | ICD-10-CM | POA: Diagnosis not present

## 2022-05-25 DIAGNOSIS — M79641 Pain in right hand: Secondary | ICD-10-CM | POA: Diagnosis not present

## 2022-05-25 DIAGNOSIS — M19019 Primary osteoarthritis, unspecified shoulder: Secondary | ICD-10-CM | POA: Diagnosis not present

## 2022-05-25 DIAGNOSIS — M25512 Pain in left shoulder: Secondary | ICD-10-CM | POA: Diagnosis not present

## 2022-06-14 DIAGNOSIS — M79641 Pain in right hand: Secondary | ICD-10-CM | POA: Diagnosis not present

## 2022-06-14 DIAGNOSIS — M13841 Other specified arthritis, right hand: Secondary | ICD-10-CM | POA: Diagnosis not present

## 2022-06-14 DIAGNOSIS — M79642 Pain in left hand: Secondary | ICD-10-CM | POA: Diagnosis not present

## 2022-06-14 DIAGNOSIS — M13849 Other specified arthritis, unspecified hand: Secondary | ICD-10-CM | POA: Diagnosis not present

## 2022-06-21 DIAGNOSIS — M13841 Other specified arthritis, right hand: Secondary | ICD-10-CM | POA: Diagnosis not present

## 2022-06-21 DIAGNOSIS — M13842 Other specified arthritis, left hand: Secondary | ICD-10-CM | POA: Diagnosis not present

## 2022-06-25 IMAGING — DX DG RIBS 2V*R*
3 series · 3 of 3 positions shown · non-contrast
Comparison: Chest x-ray 04/18/2019.

CLINICAL DATA: Right lower rib pain after a fall.

EXAM:
RIGHT RIBS - 2 VIEW

[chest pa]
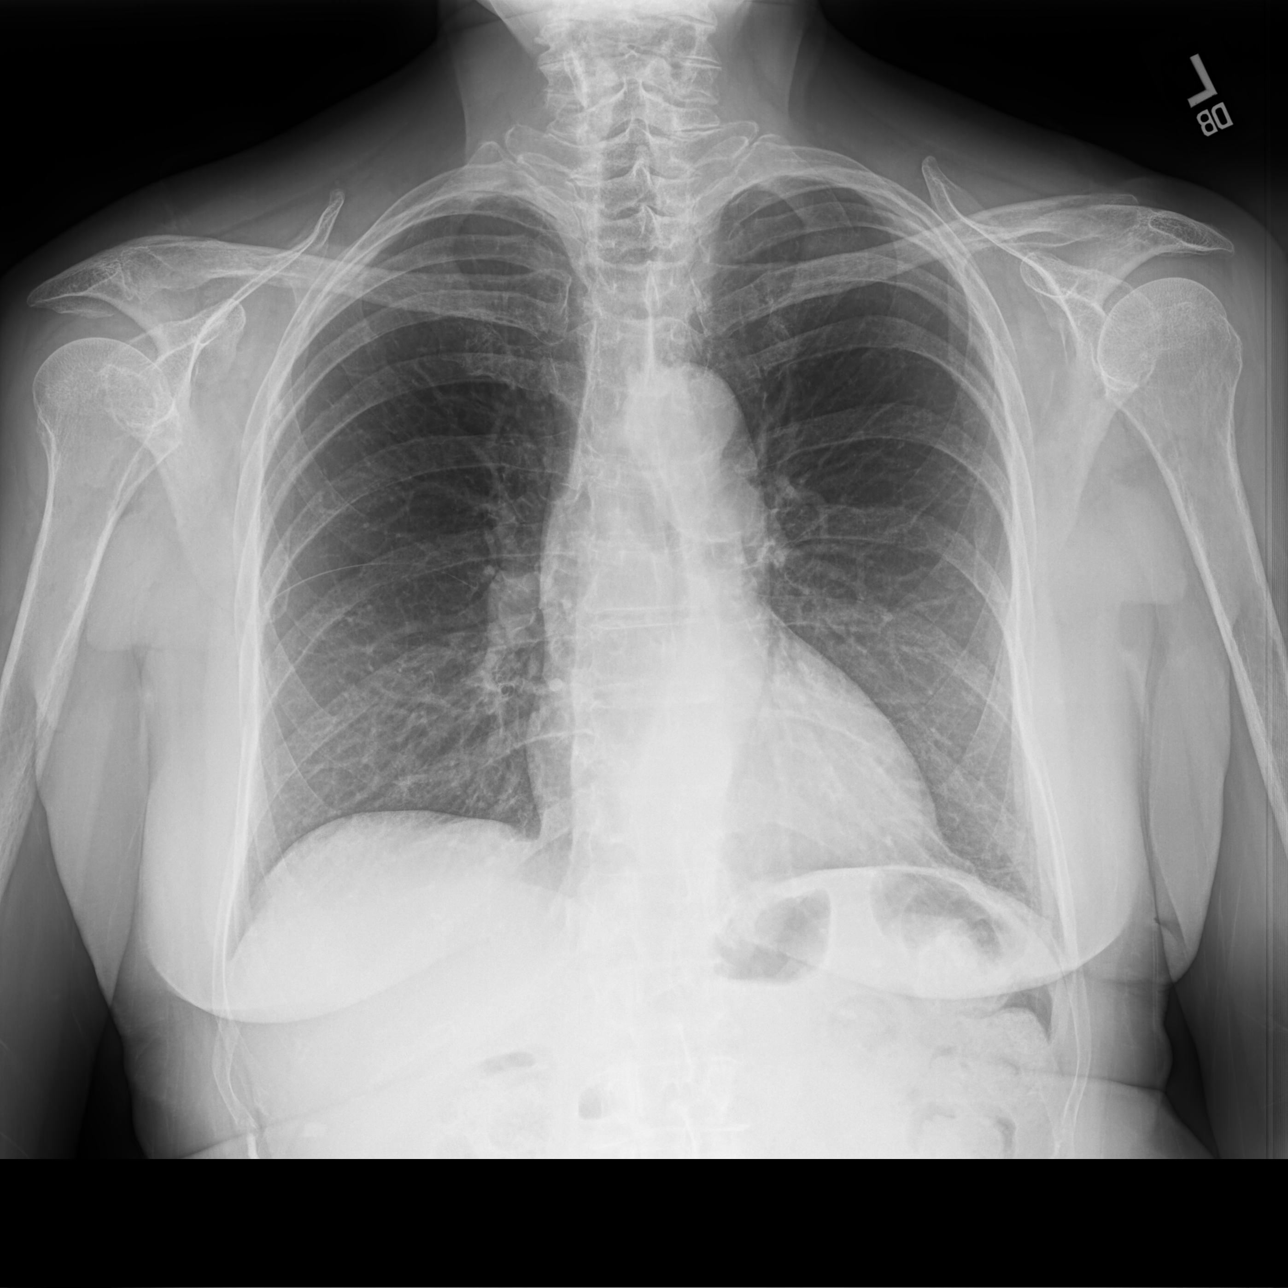

[hemithorax (ribs) ap]
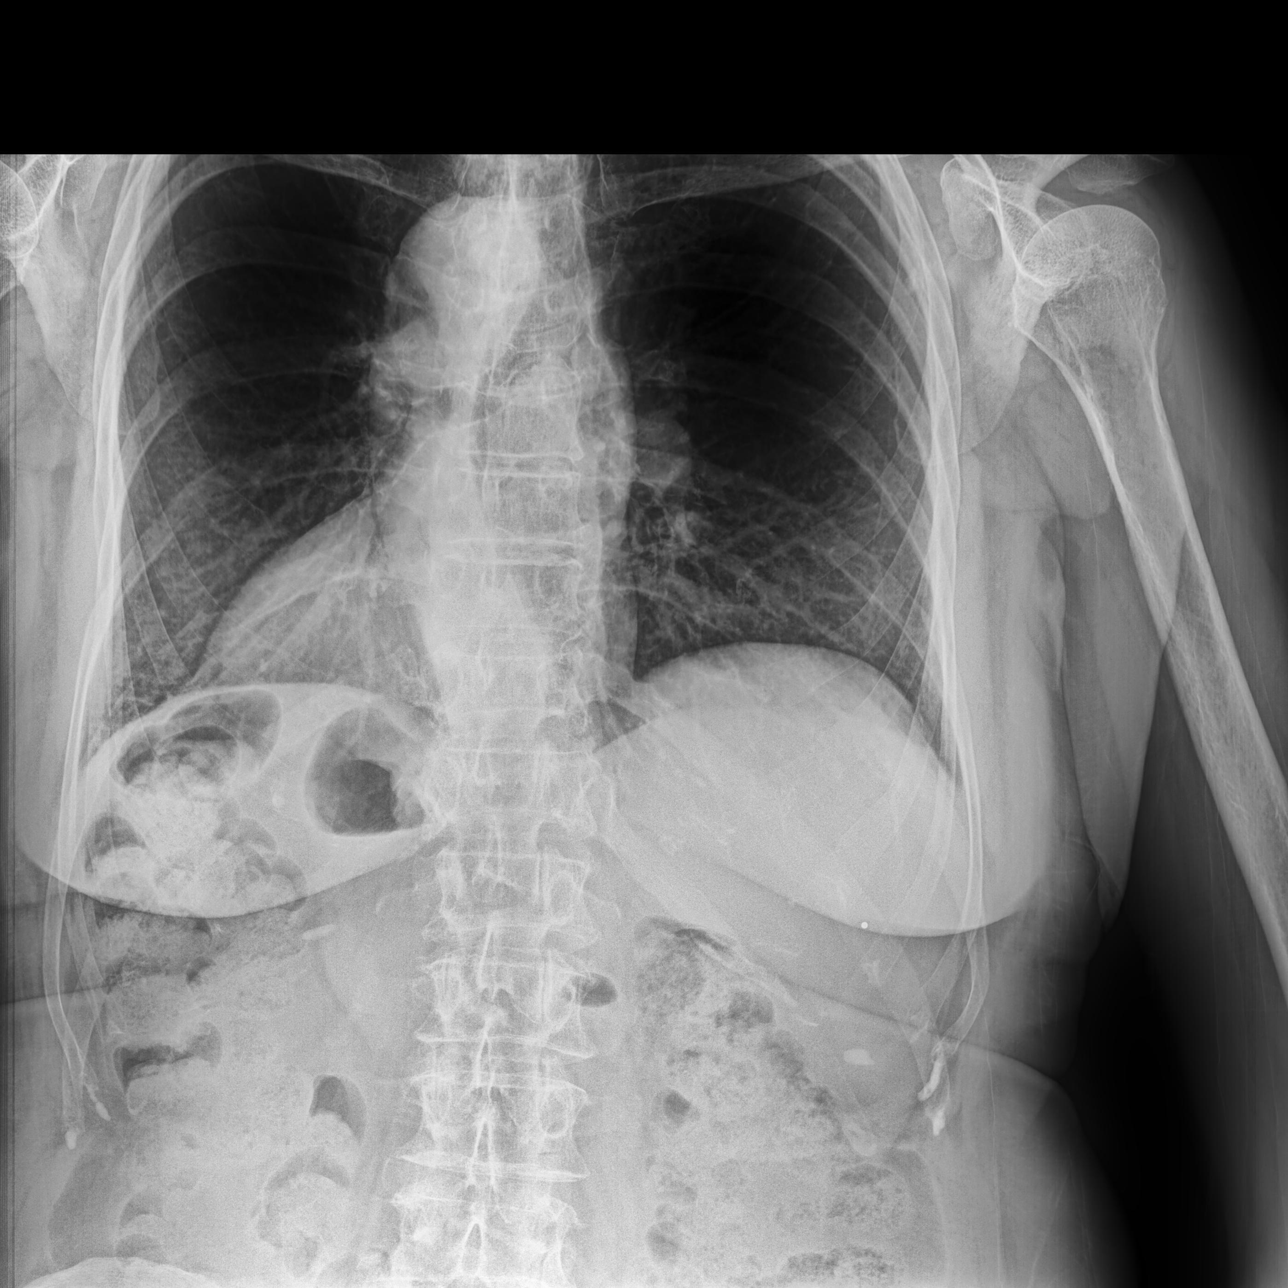

[oblique ribs oblique]
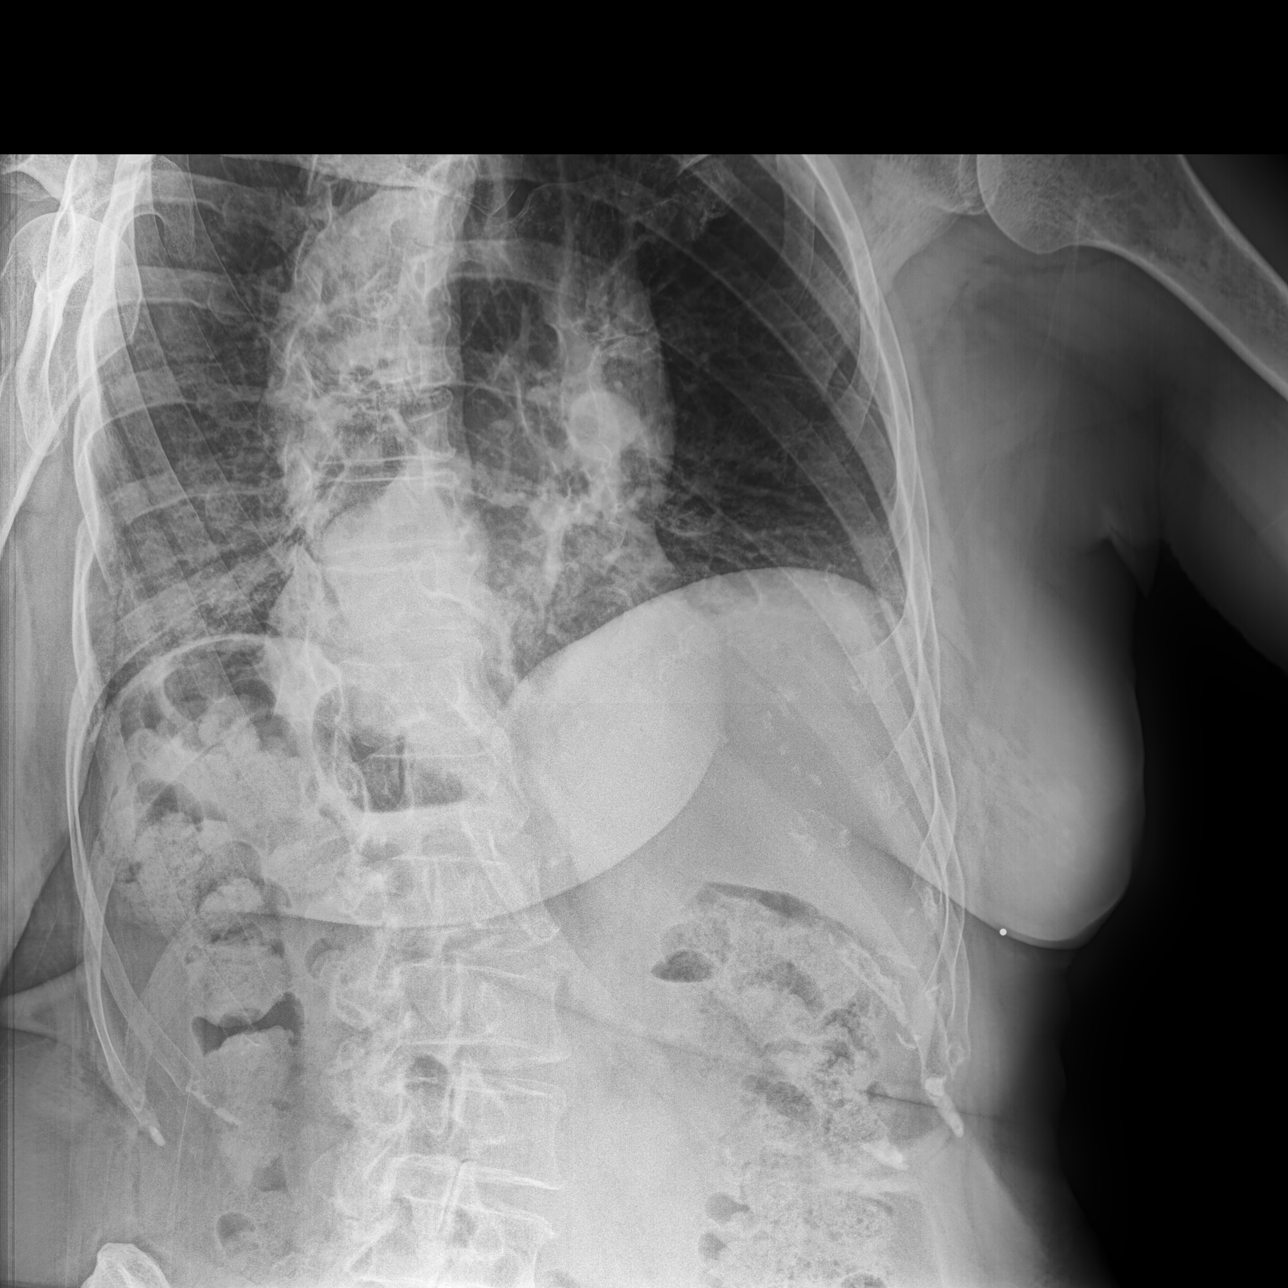

[3 of 3 positions shown; findings below may reference images not displayed]

FINDINGS: The lungs are clear without focal pneumonia, edema, pneumothorax or
pleural effusion. The cardiopericardial silhouette is within normal
limits for size. Bones are diffusely demineralized.

Oblique views of the right ribs obtained. Radio-opaque marker has
been placed on the skin at the site of patient concern. Possible
minimally displaced fracture of the anterior most aspect of the left
sixth rib, only seen on the steep oblique image.
IMPRESSION: Possible anterior left sixth rib fracture. No pneumothorax or
pleural effusion.

## 2022-06-28 ENCOUNTER — Other Ambulatory Visit: Payer: Self-pay | Admitting: Obstetrics and Gynecology

## 2022-06-28 DIAGNOSIS — Z1231 Encounter for screening mammogram for malignant neoplasm of breast: Secondary | ICD-10-CM

## 2022-06-29 DIAGNOSIS — H401131 Primary open-angle glaucoma, bilateral, mild stage: Secondary | ICD-10-CM | POA: Diagnosis not present

## 2022-08-12 ENCOUNTER — Ambulatory Visit
Admission: RE | Admit: 2022-08-12 | Discharge: 2022-08-12 | Disposition: A | Discharge status: Home / Self Care | Payer: Medicare Other | Source: Ambulatory Visit | Attending: Obstetrics and Gynecology | Admitting: Obstetrics and Gynecology

## 2022-08-12 DIAGNOSIS — Z1231 Encounter for screening mammogram for malignant neoplasm of breast: Secondary | ICD-10-CM

## 2022-08-16 ENCOUNTER — Other Ambulatory Visit: Payer: Self-pay | Admitting: Internal Medicine

## 2022-08-20 DIAGNOSIS — H401123 Primary open-angle glaucoma, left eye, severe stage: Secondary | ICD-10-CM | POA: Diagnosis not present

## 2022-09-13 ENCOUNTER — Ambulatory Visit: Payer: Medicare Other | Admitting: Family Medicine

## 2022-09-23 ENCOUNTER — Ambulatory Visit (INDEPENDENT_AMBULATORY_CARE_PROVIDER_SITE_OTHER): Payer: Medicare Other | Admitting: Family Medicine

## 2022-09-23 ENCOUNTER — Encounter: Payer: Self-pay | Admitting: Family Medicine

## 2022-09-23 VITALS — BP 120/72 | HR 83 | Temp 98.0°F | Ht 63.0 in | Wt 153.4 lb

## 2022-09-23 DIAGNOSIS — Z79899 Other long term (current) drug therapy: Secondary | ICD-10-CM

## 2022-09-23 DIAGNOSIS — E785 Hyperlipidemia, unspecified: Secondary | ICD-10-CM

## 2022-09-23 DIAGNOSIS — I10 Essential (primary) hypertension: Secondary | ICD-10-CM

## 2022-09-23 LAB — COMPREHENSIVE METABOLIC PANEL
ALT: 15 U/L (ref 0–35)
AST: 18 U/L (ref 0–37)
Albumin: 4.3 g/dL (ref 3.5–5.2)
Alkaline Phosphatase: 65 U/L (ref 39–117)
BUN: 14 mg/dL (ref 6–23)
CO2: 29 mEq/L (ref 19–32)
Calcium: 9.5 mg/dL (ref 8.4–10.5)
Chloride: 103 mEq/L (ref 96–112)
Creatinine, Ser: 0.55 mg/dL (ref 0.40–1.20)
GFR: 88.1 mL/min (ref >60.00)
Glucose, Bld: 90 mg/dL (ref 70–99)
Potassium: 4.7 mEq/L (ref 3.5–5.1)
Sodium: 140 mEq/L (ref 135–145)
Total Bilirubin: 0.8 mg/dL (ref 0.2–1.2)
Total Protein: 6.9 g/dL (ref 6.0–8.3)

## 2022-09-23 LAB — TSH: TSH: 1.3 u[IU]/mL (ref 0.35–5.50)

## 2022-09-23 LAB — CBC WITH DIFFERENTIAL/PLATELET
Basophils Absolute: 0 10*3/uL (ref 0.0–0.1)
Basophils Relative: 1.2 % (ref 0.0–3.0)
Eosinophils Absolute: 0.1 10*3/uL (ref 0.0–0.7)
Eosinophils Relative: 1.8 % (ref 0.0–5.0)
HCT: 39.4 % (ref 36.0–46.0)
Hemoglobin: 13.6 g/dL (ref 12.0–15.0)
Lymphocytes Relative: 42.5 % (ref 12.0–46.0)
Lymphs Abs: 1.7 10*3/uL (ref 0.7–4.0)
MCHC: 34.4 g/dL (ref 30.0–36.0)
MCV: 92.2 fl (ref 78.0–100.0)
Monocytes Absolute: 0.4 10*3/uL (ref 0.1–1.0)
Monocytes Relative: 9.3 % (ref 3.0–12.0)
Neutro Abs: 1.8 10*3/uL (ref 1.4–7.7)
Neutrophils Relative %: 45.2 % (ref 43.0–77.0)
Platelets: 301 10*3/uL (ref 150.0–400.0)
RBC: 4.27 Mil/uL (ref 3.87–5.11)
RDW: 13.7 % (ref 11.5–15.5)
WBC: 4 10*3/uL (ref 4.0–10.5)

## 2022-09-23 LAB — VITAMIN B12: Vitamin B-12: 1390 pg/mL — ABNORMAL HIGH (ref 211–911)

## 2022-09-23 NOTE — Patient Instructions (Addendum)
Let us know when you get your next COVID vaccine this fall  Please stop by lab before you go If you have mychart- we will send your results within 3 business days of [place] receiving them.  If you do not have mychart- we will call you about results within 5 business days of [place] receiving them.  *please also note that you will see labs on mychart as soon as they post. I will later go in and write notes on them- will say "notes from Dr. Durene Cal"   Recommended follow up: Return in about 6 months (around 03/26/2023) for followup or sooner if needed.Schedule b4 you leave.

## 2022-09-23 NOTE — Progress Notes (Signed)
Phone [phone] In person visit   Subjective:   Destiny Acosta is a 77 y.o. year old very pleasant female patient who presents for/with See problem oriented charting Chief Complaint  Patient presents with   Medical Management of Chronic Issues   Gastroesophageal Reflux   Hypertension   Hyperlipidemia   Alopecia    Pt c/o hair loss that she has noticed getting worse in the past 3 months   brain fog    Pt c/o having a lot of brain fog lately   Fatigue    Pt c/o she is still feeling fatigued and exhausted that occurs around lunch time through the rest of the day and lack of energy that has been going on for months and thinks it may be coming from Protonix.   place on back of neck    Past Medical History-  Patient Active Problem List   Diagnosis Date Noted   Spinal stenosis of lumbar region 01/14/2016    Priority: Medium    Glaucoma 04/16/2015    Priority: Medium    Suprapubic discomfort 09/17/2014    Priority: Medium    Erosive osteoarthritis of both hands 08/10/2011    Priority: Medium    Mild aortic regurgitation 05/14/2010    Priority: Medium    Hyperlipidemia 12/06/2006    Priority: Medium    Hypertension 12/06/2006    Priority: Medium    GERD (gastroesophageal reflux disease) 12/06/2006    Priority: Medium    Primary open-angle glaucoma 08/30/2016    Priority: Low   Palpitations 04/16/2015    Priority: Low   Allergic rhinitis 06/10/2014    Priority: Low   Insomnia 06/10/2014    Priority: Low   History of adenomatous polyp of colon 12/21/2006    Priority: Low    Medications- reviewed and updated Current Outpatient Medications  Medication Sig Dispense Refill   Calcium Carbonate-Vitamin D 600-400 MG-UNIT tablet Take 1 tablet by mouth daily.     dorzolamide-timolol (COSOPT) 22.3-6.8 MG/ML ophthalmic solution INTILL 1 DROP INTO BOTH EYES TWICE A DAY     estradiol (ESTRACE) 0.1 MG/GM vaginal cream Place 1 g vaginally once a week.     irbesartan (AVAPRO) 75  MG tablet TAKE 1 TABLET BY MOUTH EVERY DAY 90 tablet 3   latanoprost (XALATAN) 0.005 % ophthalmic solution Place 1 drop into both eyes at bedtime.     Multiple Vitamin (MULTIVITAMIN) capsule Take 1 capsule by mouth daily.     pantoprazole (PROTONIX) 40 MG tablet Take 1 tablet (40 mg total) by mouth daily. 90 tablet 3   tretinoin (RETIN-A) 0.1 % cream Apply topically at bedtime.     pantoprazole (PROTONIX) 20 MG tablet TAKE 1 TABLET BY MOUTH EVERY DAY (Patient not taking: Reported on 09/23/2022) 90 tablet 1   No current facility-administered medications for this visit.     Objective:  BP 120/72   Pulse 83   Temp 98 F (36.7 C)   Ht 5\' 3"  (1.6 m)   Wt 153 lb 6.4 oz (69.6 kg)   SpO2 99%   BMI 27.17 kg/m  Gen: NAD, resting comfortably CV: RRR stable murmur Lungs: CTAB no crackles, wheeze, rhonchi Ext: trace edema Skin: warm, dry     Assessment and Plan   # Low energy/brain fog S: Heavy caregiver role/burden- husband only knows her 50% of time with dementia and also irritable.   -This has been very challenging for her.  She feels as if she has noted more brain fog  recently-we discussed stress with caregiving can certainly play a role.  Has also felt fatigued and exhausted particular around lunchtime for several months.  She is concerned could be related to Protonix but she has been told she should stay on this by GI -still having to get up to help husband- slightly less with recent medication(s) change Lab Results  Component Value Date   TSH 1.374 05/28/2019   Lab Results  Component Value Date   VITAMINB12 835 03/16/2022   A/P: low energy and brain fog- we opted to check CBC, CMP, TSH, b12 (with long term proton pump inhibitor (PPI) stomach acid reducer use) -I strongly suspect stress /lack of sleep related -she is considering trying walking pad foldable as walking was a good stress relief  # Dermatological concerns S: Patient has noted more hair loss in the last 3 months -  Also notes a spot on the back of her neck - She cannot see her dermatologist until september  A/P: has noted some hair loss- we will check TSH and she will see dermatology - she may ask dermatologist or me in future about oral minoxidil  - lesion on neck does not appear cancerous- ok to wait until September to see her dermatologist  #hypertension S: medication: Irbesartan 75 mg (cough on lisinopril in the past) BP Readings from Last 3 Encounters:  09/23/22 120/72  04/15/22 120/68  03/15/22 128/78  A/P: stable- continue current medicines    #hyperlipidemia-with family history of CAD in mother at 41 S: Medication: none Coronary CT 10/01/2019 with score of 0  Lab Results  Component Value Date   CHOL 197 03/16/2022   HDL 66.00 03/16/2022   LDLCALC 115 (H) 03/16/2022   LDLDIRECT 119.0 06/01/2017   TRIG 84.0 03/16/2022   CHOLHDL 3 03/16/2022   A/P: imperfect control but with CT calcium scoring reassuring we opted to keep her off of medicine- she prefers to minimize medications if possible- consider repeat 2026 on the CT calcium scoring    # GERD S: Medication: pantoprazole 40 mg down to 20 mg (feels more tired on higher doses) -Patient with flareup of reflux in December 2020.  H. pylori negative at that time (prior omeprazole).  A/P: reasonable control and has follow up with gastroenterology who prefers for her to remain on pantoprazole long term with prior ulcers   Recommended follow up: Return in about 6 months (around 03/26/2023) for followup or sooner if needed.Schedule b4 you leave.  Lab/Order associations:   ICD-10-CM   1. Primary hypertension  I10 CBC with Differential/Platelet    Comprehensive metabolic panel    2. Hyperlipidemia, unspecified hyperlipidemia type  E78.5 CBC with Differential/Platelet    Comprehensive metabolic panel    TSH    3. High risk medication use  Z79.899 Vitamin B12     No orders of the defined types were placed in this encounter.  Return  precautions advised.  Tana Conch, MD

## 2022-12-20 ENCOUNTER — Other Ambulatory Visit: Payer: Self-pay | Admitting: Internal Medicine

## 2022-12-20 NOTE — Telephone Encounter (Signed)
Protonix refilled

## 2022-12-24 DIAGNOSIS — H401131 Primary open-angle glaucoma, bilateral, mild stage: Secondary | ICD-10-CM | POA: Diagnosis not present

## 2023-01-24 DIAGNOSIS — Z23 Encounter for immunization: Secondary | ICD-10-CM | POA: Diagnosis not present

## 2023-01-30 ENCOUNTER — Other Ambulatory Visit: Payer: Self-pay | Admitting: Family Medicine

## 2023-03-03 ENCOUNTER — Other Ambulatory Visit: Payer: Self-pay | Admitting: Internal Medicine

## 2023-03-03 DIAGNOSIS — R1013 Epigastric pain: Secondary | ICD-10-CM

## 2023-03-03 DIAGNOSIS — K222 Esophageal obstruction: Secondary | ICD-10-CM

## 2023-03-03 DIAGNOSIS — K219 Gastro-esophageal reflux disease without esophagitis: Secondary | ICD-10-CM

## 2023-03-29 ENCOUNTER — Ambulatory Visit: Payer: Medicare Other | Admitting: Family Medicine

## 2023-04-12 DIAGNOSIS — L570 Actinic keratosis: Secondary | ICD-10-CM | POA: Diagnosis not present

## 2023-04-12 DIAGNOSIS — L821 Other seborrheic keratosis: Secondary | ICD-10-CM | POA: Diagnosis not present

## 2023-04-12 DIAGNOSIS — L814 Other melanin hyperpigmentation: Secondary | ICD-10-CM | POA: Diagnosis not present

## 2023-04-12 DIAGNOSIS — L299 Pruritus, unspecified: Secondary | ICD-10-CM | POA: Diagnosis not present

## 2023-04-12 DIAGNOSIS — Z85828 Personal history of other malignant neoplasm of skin: Secondary | ICD-10-CM | POA: Diagnosis not present

## 2023-04-12 DIAGNOSIS — Z1283 Encounter for screening for malignant neoplasm of skin: Secondary | ICD-10-CM | POA: Diagnosis not present

## 2023-04-19 DIAGNOSIS — H401131 Primary open-angle glaucoma, bilateral, mild stage: Secondary | ICD-10-CM | POA: Diagnosis not present

## 2023-05-01 ENCOUNTER — Other Ambulatory Visit: Payer: Self-pay | Admitting: Internal Medicine

## 2023-05-01 DIAGNOSIS — K219 Gastro-esophageal reflux disease without esophagitis: Secondary | ICD-10-CM

## 2023-05-01 DIAGNOSIS — R1013 Epigastric pain: Secondary | ICD-10-CM

## 2023-05-01 DIAGNOSIS — K222 Esophageal obstruction: Secondary | ICD-10-CM

## 2023-05-09 ENCOUNTER — Ambulatory Visit (INDEPENDENT_AMBULATORY_CARE_PROVIDER_SITE_OTHER): Payer: Medicare Other | Admitting: Family Medicine

## 2023-05-09 ENCOUNTER — Encounter: Payer: Self-pay | Admitting: Family Medicine

## 2023-05-09 VITALS — BP 118/68 | HR 88 | Ht 64.0 in | Wt 152.0 lb

## 2023-05-09 DIAGNOSIS — I10 Essential (primary) hypertension: Secondary | ICD-10-CM

## 2023-05-09 DIAGNOSIS — E785 Hyperlipidemia, unspecified: Secondary | ICD-10-CM | POA: Diagnosis not present

## 2023-05-09 DIAGNOSIS — H9193 Unspecified hearing loss, bilateral: Secondary | ICD-10-CM

## 2023-05-09 LAB — COMPREHENSIVE METABOLIC PANEL
ALT: 18 U/L (ref 0–35)
AST: 21 U/L (ref 0–37)
Albumin: 4.4 g/dL (ref 3.5–5.2)
Alkaline Phosphatase: 73 U/L (ref 39–117)
BUN: 21 mg/dL (ref 6–23)
CO2: 28 meq/L (ref 19–32)
Calcium: 9.6 mg/dL (ref 8.4–10.5)
Chloride: 104 meq/L (ref 96–112)
Creatinine, Ser: 0.54 mg/dL (ref 0.40–1.20)
GFR: 88.1 mL/min (ref >60.00)
Glucose, Bld: 103 mg/dL — ABNORMAL HIGH (ref 70–99)
Potassium: 4.4 meq/L (ref 3.5–5.1)
Sodium: 141 meq/L (ref 135–145)
Total Bilirubin: 0.6 mg/dL (ref 0.2–1.2)
Total Protein: 7.3 g/dL (ref 6.0–8.3)

## 2023-05-09 LAB — CBC WITH DIFFERENTIAL/PLATELET
Basophils Absolute: 0.1 10*3/uL (ref 0.0–0.1)
Basophils Relative: 0.8 % (ref 0.0–3.0)
Eosinophils Absolute: 0.2 10*3/uL (ref 0.0–0.7)
Eosinophils Relative: 2.6 % (ref 0.0–5.0)
HCT: 38.2 % (ref 36.0–46.0)
Hemoglobin: 12.9 g/dL (ref 12.0–15.0)
Lymphocytes Relative: 25.3 % (ref 12.0–46.0)
Lymphs Abs: 1.6 10*3/uL (ref 0.7–4.0)
MCHC: 33.9 g/dL (ref 30.0–36.0)
MCV: 95 fL (ref 78.0–100.0)
Monocytes Absolute: 0.4 10*3/uL (ref 0.1–1.0)
Monocytes Relative: 6.6 % (ref 3.0–12.0)
Neutro Abs: 4.2 10*3/uL (ref 1.4–7.7)
Neutrophils Relative %: 64.7 % (ref 43.0–77.0)
Platelets: 305 10*3/uL (ref 150.0–400.0)
RBC: 4.02 Mil/uL (ref 3.87–5.11)
RDW: 13.5 % (ref 11.5–15.5)
WBC: 6.4 10*3/uL (ref 4.0–10.5)

## 2023-05-09 LAB — LIPID PANEL
Cholesterol: 224 mg/dL — ABNORMAL HIGH (ref 0–200)
HDL: 62.8 mg/dL (ref >39.00)
LDL Cholesterol: 130 mg/dL — ABNORMAL HIGH (ref 0–99)
NonHDL: 160.78
Total CHOL/HDL Ratio: 4
Triglycerides: 152 mg/dL — ABNORMAL HIGH (ref 0.0–149.0)
VLDL: 30.4 mg/dL (ref 0.0–40.0)

## 2023-05-09 MED ORDER — TRAZODONE HCL 50 MG PO TABS
25.0000 mg | ORAL_TABLET | Freq: Every evening | ORAL | 3 refills | Status: DC | PRN
Start: 2023-05-09 — End: 2023-05-31

## 2023-05-09 NOTE — Patient Instructions (Addendum)
Due for Tetanus, Diphtheria, and Pertussis (Tdap) at pharmacy  Team try to get COVID vaccine date from October and input   Trial trazodone just half tablet for 3-4 nights for sleep  Please stop by lab before you go If you have mychart- we will send your results within 3 business days of [place] receiving them.  If you do not have mychart- we will call you about results within 5 business days of [place] receiving them.  *please also note that you will see labs on mychart as soon as they post. I will later go in and write notes on them- will say "notes from Dr. Durene Cal"   We have placed a referral for you today to audiology within [facility]. In some cases you will see # listed below- you can call this if you have not heard within a week. If you do not see # listed- you should receive a mychart message or phone call within a week with the # to call directly- call that as soon as you get it. If you are having issues getting scheduled reach out to [place] again.    Recommended follow up: Return in about 6 months (around 11/07/2023) for followup or sooner if needed.Schedule b4 you leave.

## 2023-05-09 NOTE — Progress Notes (Signed)
Phone [phone] In person visit   Subjective:   Destiny Acosta is a 77 y.o. year old very pleasant female patient who presents for/with See problem oriented charting Chief Complaint  Patient presents with   Follow-up    6 month f/u-discuss lack of sleep and Protonix medication    Past Medical History-  Patient Active Problem List   Diagnosis Date Noted   Spinal stenosis of lumbar region 01/14/2016    Priority: Medium    Glaucoma 04/16/2015    Priority: Medium    Suprapubic discomfort 09/17/2014    Priority: Medium    Erosive osteoarthritis of both hands 08/10/2011    Priority: Medium    Mild aortic regurgitation 05/14/2010    Priority: Medium    Hyperlipidemia 12/06/2006    Priority: Medium    Hypertension 12/06/2006    Priority: Medium    GERD (gastroesophageal reflux disease) 12/06/2006    Priority: Medium    Primary open-angle glaucoma 08/30/2016    Priority: Low   Palpitations 04/16/2015    Priority: Low   Allergic rhinitis 06/10/2014    Priority: Low   Insomnia 06/10/2014    Priority: Low   History of adenomatous polyp of colon 12/21/2006    Priority: Low    Medications- reviewed and updated Current Outpatient Medications  Medication Sig Dispense Refill   Calcium Carbonate-Vitamin D 600-400 MG-UNIT tablet Take 1 tablet by mouth daily.     dorzolamide-timolol (COSOPT) 22.3-6.8 MG/ML ophthalmic solution INTILL 1 DROP INTO BOTH EYES TWICE A DAY     estradiol (ESTRACE) 0.1 MG/GM vaginal cream Place 1 g vaginally once a week.     irbesartan (AVAPRO) 75 MG tablet TAKE 1 TABLET BY MOUTH EVERY DAY 90 tablet 3   latanoprost (XALATAN) 0.005 % ophthalmic solution Place 1 drop into both eyes at bedtime.     Multiple Vitamin (MULTIVITAMIN) capsule Take 1 capsule by mouth daily.     pantoprazole (PROTONIX) 20 MG tablet TAKE 1 TABLET BY MOUTH EVERY DAY 90 tablet 1   pantoprazole (PROTONIX) 40 MG tablet Take 1 tablet (40 mg total) by mouth daily. Office visit for  further refills 90 tablet 0   traZODone (DESYREL) 50 MG tablet Take 0.5-1 tablets (25-50 mg total) by mouth at bedtime as needed for sleep. 30 tablet 3   tretinoin (RETIN-A) 0.1 % cream Apply topically at bedtime.     No current facility-administered medications for this visit.     Objective:  BP 118/68 (BP Location: Left Arm, Patient Position: Sitting, Cuff Size: Normal)   Pulse 88   Ht 5\' 4"  (1.626 m)   Wt 152 lb (68.9 kg)   SpO2 97%   BMI 26.09 kg/m  Gen: NAD, resting comfortably CV: RRR no murmurs rubs or gallops Lungs: CTAB no crackles, wheeze, rhonchi Abdomen: soft/nontender/nondistended/normal bowel sounds. No rebound or guarding.  Ext: no edema Skin: warm, dry Neuro: grossly normal, moves all extremities     Assessment and Plan   #social update- lost husband around October 2024   - thankfully her stamina has improved- we think the caregiver role was playing a big factor - still not sleeping perfectly after she was staying up to try to care for husband. Wakes up around 4 30 am and not going back to sleep- that's why he would wake her up - she is hoping it will self correct. Goes to bed at 10 or 11 pm but doesn't fall asleep quickly- maybe 12 30 or 1  -mentioned trazodone  and she opts in -melatonin not helpful  #hypertension S: medication: Irbesartan 75 mg (cough on lisinopril in the past) BP Readings from Last 3 Encounters:  05/09/23 118/68  09/23/22 120/72  04/15/22 120/68  A/P: stable- continue current medicines    #hyperlipidemia-with family history of CAD in mother at 27 S: Medication: none Coronary CT 10/01/2019 with score of 0 Lab Results  Component Value Date   CHOL 197 03/16/2022   HDL 66.00 03/16/2022   LDLCALC 115 (H) 03/16/2022   LDLDIRECT 119.0 06/01/2017   TRIG 84.0 03/16/2022   CHOLHDL 3 03/16/2022   A/P: hopefully stable- update lipid panel today. Continue without meds for now    # GERD S: Medication: pantoprazole 40 mg or 20 mg - sometimes  can get down to 20 mg but flares back up. She is pretty much on 40 mg now even with good diet.  -occasionally feels like foods get stuck- has had dilations in past A/P: still reports some breakthrough even on 40 mg at times- encouraged her to call to schedule follow up with gastrointestinal team   #reports plaque psoriasis In scalp on last dermatology visit- wants to monitor only.   #hearing loss- refer to audiology   Recommended follow up: Return in about 6 months (around 11/07/2023) for followup or sooner if needed.Schedule b4 you leave.  Lab/Order associations: NOT fasting   ICD-10-CM   1. Primary hypertension  I10 Comprehensive metabolic panel    CBC with Differential/Platelet    Lipid panel    2. Hyperlipidemia, unspecified hyperlipidemia type  E78.5 Comprehensive metabolic panel    CBC with Differential/Platelet    Lipid panel    3. Bilateral hearing loss, unspecified hearing loss type  H91.93 Ambulatory referral to Audiology      Meds ordered this encounter  Medications   traZODone (DESYREL) 50 MG tablet    Sig: Take 0.5-1 tablets (25-50 mg total) by mouth at bedtime as needed for sleep.    Dispense:  30 tablet    Refill:  3    Return precautions advised.  Tana Conch, MD

## 2023-05-31 ENCOUNTER — Other Ambulatory Visit: Payer: Self-pay | Admitting: Family Medicine

## 2023-07-04 ENCOUNTER — Other Ambulatory Visit: Payer: Self-pay | Admitting: Obstetrics and Gynecology

## 2023-07-04 ENCOUNTER — Ambulatory Visit (INDEPENDENT_AMBULATORY_CARE_PROVIDER_SITE_OTHER): Payer: Medicare Other

## 2023-07-04 VITALS — Wt 152.0 lb

## 2023-07-04 DIAGNOSIS — Z0000 Encounter for general adult medical examination without abnormal findings: Secondary | ICD-10-CM

## 2023-07-04 DIAGNOSIS — Z1231 Encounter for screening mammogram for malignant neoplasm of breast: Secondary | ICD-10-CM

## 2023-07-04 NOTE — Progress Notes (Signed)
 Subjective:   Destiny Acosta is a 78 y.o. who presents for a Medicare Wellness preventive visit.  Visit Complete: Virtual I connected with  Destiny Acosta on 07/04/23 by a audio enabled telemedicine application and verified that I am speaking with the correct person using two identifiers.  Patient Location: Home  Provider Location: Office/Clinic  I discussed the limitations of evaluation and management by telemedicine. The patient expressed understanding and agreed to proceed.  Vital Signs: Because this visit was a virtual/telehealth visit, some criteria may be missing or patient reported. Any vitals not documented were not able to be obtained and vitals that have been documented are patient reported.    AWV Questionnaire: No: Patient Medicare AWV questionnaire was not completed prior to this visit.  Cardiac Risk Factors include: advanced age (>54men, >7 women);dyslipidemia;hypertension     Objective:    Today's Vitals   07/04/23 1545  Weight: 152 lb (68.9 kg)   Body mass index is 26.09 kg/m.     07/04/2023    3:59 PM 04/15/2022    1:14 PM 09/02/2020    4:41 PM 05/22/2020    9:12 AM 02/01/2019    3:24 PM 01/03/2018    1:36 PM 03/01/2016    9:55 AM  Advanced Directives  Does Patient Have a Medical Advance Directive? Yes Yes No Yes Yes Yes Yes  Type of Estate agent of [place];Living will Healthcare Power of [place];Living will  Healthcare Power of [place];Living will Living will Healthcare Power of [place];Living will Living will;Healthcare Power of Attorney  Does patient want to make changes to medical advance directive?     No - Patient declined No - Patient declined   Copy of Healthcare Power of Attorney in Chart? No - copy requested No - copy requested  No - copy requested  No - copy requested   Would patient like information on creating a medical advance directive?   No - Patient declined        Current Medications (verified) Outpatient  Encounter Medications as of 07/04/2023  Medication Sig   Calcium Carbonate-Vitamin D 600-400 MG-UNIT tablet Take 1 tablet by mouth daily.   dorzolamide-timolol (COSOPT) 22.3-6.8 MG/ML ophthalmic solution INTILL 1 DROP INTO BOTH EYES TWICE A DAY   estradiol (ESTRACE) 0.1 MG/GM vaginal cream Place 1 g vaginally once a week.   irbesartan (AVAPRO) 75 MG tablet TAKE 1 TABLET BY MOUTH EVERY DAY   latanoprost (XALATAN) 0.005 % ophthalmic solution Place 1 drop into both eyes at bedtime.   Multiple Vitamin (MULTIVITAMIN) capsule Take 1 capsule by mouth daily.   pantoprazole (PROTONIX) 20 MG tablet TAKE 1 TABLET BY MOUTH EVERY DAY   pantoprazole (PROTONIX) 40 MG tablet Take 1 tablet (40 mg total) by mouth daily. Office visit for further refills   tretinoin (RETIN-A) 0.1 % cream Apply topically at bedtime.   traZODone (DESYREL) 50 MG tablet TAKE 0.5-1 TABLETS BY MOUTH AT BEDTIME AS NEEDED FOR SLEEP. (Patient not taking: Reported on 07/04/2023)   No facility-administered encounter medications on file as of 07/04/2023.    Allergies (verified) Prednisolone, Tizanidine, Silicone, Ace inhibitors, Ciprofloxacin, Codeine phosphate, Kiwi extract, Tramadol, and Latex   History: Past Medical History:  Diagnosis Date   Allergy    Eggplant/KIWI   Anxiety    not on meds   Aortal stenosis 05/14/2010   Arthritis    Cancer (HCC) 11/2015   skin cancer forehead; MOHS procedure 11/2015   Cataract    Colon polyps  Depression    not on meds   GERD 12/06/2006   on meds   Glaucoma    Heart murmur    aortic valve-not treatment advised (04/13/2021)   HYPERLIPIDEMIA 12/06/2006   diet controlled   Hyperplastic colon polyp    HYPERTENSION 12/06/2006   on meds   Lung granuloma (HCC)    MITRAL VALVE PROLAPSE 12/06/2006   Osteopenia    Osteoporosis    Spinal stenosis    Past Surgical History:  Procedure Laterality Date   BREAST CYST ASPIRATION     CATARACT EXTRACTION Bilateral 2020   one done in 2019    COLONOSCOPY  2017   JP-MAC-suprep(good)-SSA x 2, polyp   POLYPECTOMY  2017   SSA x 2   TUBAL LIGATION     WISDOM TOOTH EXTRACTION     Family History  Problem Relation Age of Onset   Diabetes Mother    Heart failure Mother    Heart disease Mother 30       CABG, quit 20 years prior o bypass   Colon polyps Father 25   Colon cancer Father 22   Atrial fibrillation Father    Prostate cancer Father    Colon polyps Sister 88   Arthritis Sister    Diabetes Maternal Grandmother    Heart disease Maternal Grandmother    Heart disease Maternal Grandfather    Rheum arthritis Paternal Grandmother    Heart disease Paternal Grandmother    Lung disease Paternal Grandfather    Heart disease Paternal Grandfather    Esophageal cancer Neg Hx    Rectal cancer Neg Hx    Stomach cancer Neg Hx    Social History   Socioeconomic History   Marital status: Widowed    Spouse name: Not on file   Number of children: Not on file   Years of education: Not on file   Highest education level: Not on file  Occupational History   Not on file  Tobacco Use   Smoking status: Never   Smokeless tobacco: Never  Vaping Use   Vaping status: Never Used  Substance and Sexual Activity   Alcohol use: Yes    Comment: rarely may have 1 wine a month or less   Drug use: No   Sexual activity: Not Currently  Other Topics Concern   Not on file  Social History Narrative   Married 1967. 1 son, lives in [place]. 1 grandson in [place] age 53.       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       Hobbies: family time, walking 2.5-3 miles daily, moviews    Social Drivers of Health   Financial Resource Strain: Low Risk  (07/04/2023)   Overall Financial Resource Strain (CARDIA)    Difficulty of Paying Living Expenses: Not hard at all  Food Insecurity: No Food Insecurity (07/04/2023)   Hunger Vital Sign    Worried About Running Out of Food in the Last Year: Never true    Ran Out of Food in the  Last Year: Never true  Transportation Needs: No Transportation Needs (07/04/2023)   PRAPARE - Administrator, Civil Service (Medical): No    Lack of Transportation (Non-Medical): No  Physical Activity: Insufficiently Active (07/04/2023)   Exercise Vital Sign    Days of Exercise per Week: 3 days    Minutes of Exercise per Session: 30 min  Stress: Stress Concern Present (07/04/2023)   [employer] of Occupational Health - Occupational  Stress Questionnaire    Feeling of Stress : To some extent  Social Connections: Moderately Isolated (07/04/2023)   Social Connection and Isolation Panel [NHANES]    Frequency of Communication with Friends and Family: More than three times a week    Frequency of Social Gatherings with Friends and Family: More than three times a week    Attends Religious Services: 1 to 4 times per year    Active Member of [employer] or Organizations: No    Attends Banker Meetings: Never    Marital Status: Widowed    Tobacco Counseling Counseling given: Not Answered    Clinical Intake:  Pre-visit preparation completed: Yes  Pain : No/denies pain     BMI - recorded: 26.09 Nutritional Status: BMI 25 -29 Overweight Nutritional Risks: None Diabetes: No  How often do you need to have someone help you when you read instructions, pamphlets, or other written materials from your doctor or pharmacy?: 1 - Never  Interpreter Needed?: No  Information entered by :: Lanier Ensign, LPN   Activities of Daily Living     07/04/2023    3:47 PM  In your present state of health, do you have any difficulty performing the following activities:  Hearing? 0  Vision? 0  Difficulty concentrating or making decisions? 0  Walking or climbing stairs? 0  Dressing or bathing? 0  Doing errands, shopping? 0  Preparing Food and eating ? N  Using the Toilet? N  In the past six months, have you accidently leaked urine? N  Do you have problems with loss of bowel  control? N  Managing your Medications? N  Managing your Finances? N  Housekeeping or managing your Housekeeping? N    Patient Care Team: Shelva Majestic, MD as PCP - General (Family Medicine) Amalia Greenhouse, MD as Consulting Physician (Dermatology) Ancil Boozer, MD as Consulting Physician (Rheumatology) Sinda Du, MD as Consulting Physician (Ophthalmology) Dahlia Byes, [facility] as Pharmacist (Pharmacist)  Indicate any recent Medical Services you may have received from other than [facility] providers in the past year (date may be approximate).     Assessment:   This is a routine wellness examination for [place].  Hearing/Vision screen Hearing Screening - Comments:: Pt stated slight HOH will follow up with audiologist  Vision Screening - Comments:: Pt follows up with Dr Sinda Du for annual eye exams    Goals Addressed             This Visit's Progress    Patient Stated       Continue walking        Depression Screen     07/04/2023    4:01 PM 05/09/2023    1:04 PM 04/15/2022    1:09 PM 03/15/2022   10:31 AM 03/13/2021    1:49 PM 09/11/2020   10:29 AM 05/22/2020    9:08 AM  PHQ 2/9 Scores  PHQ - 2 Score 1 2 1  0 0 0 0  PHQ- 9 Score 1 7    0     Fall Risk     07/04/2023    4:00 PM 05/09/2023    1:03 PM 04/15/2022    1:16 PM 03/15/2022   10:31 AM 03/13/2021    1:49 PM  Fall Risk   Falls in the past year? 0 0 0 0 0  Number falls in past yr: 0 0 0 0 0  Injury with Fall? 0 0 0 0 0  Risk for fall due to :  No Fall Risks No Fall Risks Impaired vision No Fall Risks   Follow up Falls prevention discussed;Falls evaluation completed Falls evaluation completed;Falls prevention discussed Falls prevention discussed Falls evaluation completed     MEDICARE RISK AT HOME:  Medicare Risk at Home Any stairs in or around the home?: Yes If so, are there any without handrails?: No Home free of loose throw rugs in walkways, pet beds, electrical cords, etc?: Yes Adequate  lighting in your home to reduce risk of falls?: Yes Life alert?: No Use of a cane, walker or w/c?: No Grab bars in the bathroom?: No Shower chair or bench in shower?: Yes Elevated toilet seat or a handicapped toilet?: No  TIMED UP AND GO:  Was the test performed?  No  Cognitive Function: 6CIT completed        05/22/2020    9:17 AM 02/01/2019    3:31 PM 01/03/2018    1:56 PM  6CIT Screen  What Year? 0 points 0 points 0 points  What month? 0 points 0 points 0 points  What time?  0 points 0 points  Count back from 20 0 points 0 points 0 points  Months in reverse 0 points 0 points 0 points  Repeat phrase 0 points 0 points   Total Score  0 points     Immunizations Immunization History  Administered Date(s) Administered   Covid-19 Iv Non-us Vaccine (Bibp, Sinopharm) 02/11/2022   Fluad Quad(high Dose 65+) 12/27/2018, 01/18/2020, 02/10/2022   Fluzone Influenza virus vaccine,trivalent (IIV3), split virus 02/27/2004   Influenza Split 02/08/2011, 02/08/2012   Influenza Whole 03/02/2007, 02/20/2008   Influenza, High Dose Seasonal PF 01/14/2016, 02/02/2017, 01/13/2018   Influenza,inj,Quad PF,6+ Mos 02/09/2013, 01/18/2014, 02/06/2015, 02/07/2017   Influenza-Unspecified 02/27/2004, 02/07/2017, 02/08/2022   PFIZER Comirnaty(Gray Top)Covid-19 Tri-Sucrose Vaccine 02/09/2022   PFIZER(Purple Top)SARS-COV-2 Vaccination 05/27/2019, 06/17/2019, 01/04/2020, 10/14/2020   Pfizer Covid-19 Vaccine Bivalent Booster 5yrs & up 02/13/2021, 02/09/2023   Pneumococcal Conjugate-13 06/10/2014   Pneumococcal Polysaccharide-23 11/17/2007, 02/08/2011   Respiratory Syncytial Virus Vaccine,Recomb Aduvanted(Arexvy) 02/09/2022, 02/28/2022   Td 05/10/2001   Td (Adult), 2 Lf Tetanus Toxid, Preservative Free 05/10/2001   Tdap 04/18/2013   Zoster Recombinant(Shingrix) 02/16/2019, 04/20/2019   Zoster, Live 12/20/2007    Screening Tests Health Maintenance  Topic Date Due   COVID-19 Vaccine (8 - 2024-25 season)  04/06/2023   DTaP/Tdap/Td (4 - Td or Tdap) 04/19/2023   Medicare Annual Wellness (AWV)  07/03/2024   Colonoscopy  04/27/2026   Pneumonia Vaccine 85+ Years old  Completed   INFLUENZA VACCINE  Completed   DEXA SCAN  Completed   Hepatitis C Screening  Completed   Zoster Vaccines- Shingrix  Completed   HPV VACCINES  Aged Out    Health Maintenance  Health Maintenance Due  Topic Date Due   COVID-19 Vaccine (8 - 2024-25 season) 04/06/2023   DTaP/Tdap/Td (4 - Td or Tdap) 04/19/2023   Health Maintenance Items Addressed:   Additional Screening:  Vision Screening: Recommended annual ophthalmology exams for early detection of glaucoma and other disorders of the eye.  Dental Screening: Recommended annual dental exams for proper oral hygiene  Community Resource Referral / Chronic Care Management: CRR required this visit?  No   CCM required this visit?  No     Plan:     I have personally reviewed and noted the following in the patient's chart:   Medical and social history Use of alcohol, tobacco or illicit drugs  Current medications and supplements including opioid prescriptions. Patient is  not currently taking opioid prescriptions. Functional ability and status Nutritional status Physical activity Advanced directives List of other physicians Hospitalizations, surgeries, and ER visits in previous 12 months Vitals Screenings to include cognitive, depression, and falls Referrals and appointments  In addition, I have reviewed and discussed with patient certain preventive protocols, quality metrics, and best practice recommendations. A written personalized care plan for preventive services as well as general preventive health recommendations were provided to patient.     Marzella Schlein, LPN   [id number]/[id number]   After Visit Summary: (MyChart) Due to this being a telephonic visit, the after visit summary with patients personalized plan was offered to patient via MyChart   Notes:  Nothing significant to report at this time.

## 2023-07-04 NOTE — Patient Instructions (Signed)
 Destiny Acosta , Thank you for taking time to come for your Medicare Wellness Visit. I appreciate your ongoing commitment to your health goals. Please review the following plan we discussed and let me know if I can assist you in the future.   Referrals/Orders/Follow-Ups/Clinician Recommendations: Aim for 30 minutes of exercise or brisk walking, 6-8 glasses of water, and 5 servings of fruits and vegetables each day. Continue to walk with family and friends  This is a list of the screening recommended for you and due dates:  Health Maintenance  Topic Date Due   COVID-19 Vaccine (8 - 2024-25 season) 04/06/2023   Medicare Annual Wellness Visit  04/16/2023   DTaP/Tdap/Td vaccine (4 - Td or Tdap) 04/19/2023   Colon Cancer Screening  04/27/2026   Pneumonia Vaccine  Completed   Flu Shot  Completed   DEXA scan (bone density measurement)  Completed   Hepatitis C Screening  Completed   Zoster (Shingles) Vaccine  Completed   HPV Vaccine  Aged Out    Advanced directives: (Copy Requested) Please bring a copy of your health care power of attorney and living will to the office to be added to your chart at your convenience.  Next Medicare Annual Wellness Visit scheduled for next year: Yes

## 2023-07-19 ENCOUNTER — Encounter: Payer: Self-pay | Admitting: Family Medicine

## 2023-07-19 ENCOUNTER — Ambulatory Visit: Admitting: Family Medicine

## 2023-07-19 VITALS — BP 124/80 | HR 75 | Temp 97.3°F | Ht 64.0 in | Wt 151.6 lb

## 2023-07-19 DIAGNOSIS — Z872 Personal history of diseases of the skin and subcutaneous tissue: Secondary | ICD-10-CM | POA: Diagnosis not present

## 2023-07-19 DIAGNOSIS — I10 Essential (primary) hypertension: Secondary | ICD-10-CM | POA: Diagnosis not present

## 2023-07-19 DIAGNOSIS — M154 Erosive (osteo)arthritis: Secondary | ICD-10-CM

## 2023-07-19 DIAGNOSIS — K219 Gastro-esophageal reflux disease without esophagitis: Secondary | ICD-10-CM | POA: Diagnosis not present

## 2023-07-19 NOTE — Patient Instructions (Addendum)
 Call [facility] Rheumatology if you don't hear within a week Address: [address], [place], [place] [address] Phone: [phone]  Recommended follow up: Return for next already scheduled visit or sooner if needed.

## 2023-07-19 NOTE — Progress Notes (Signed)
 Phone [phone] In person visit   Subjective:   Destiny Acosta is a 78 y.o. year old very pleasant female patient who presents for/with See problem oriented charting Chief Complaint  Patient presents with   Hand Pain    Pt c/o joint pain in both hands, worse in right thumb.   Past Medical History-  Patient Active Problem List   Diagnosis Date Noted   Spinal stenosis of lumbar region 01/14/2016    Priority: Medium    Glaucoma 04/16/2015    Priority: Medium    Suprapubic discomfort 09/17/2014    Priority: Medium    Erosive osteoarthritis of both hands 08/10/2011    Priority: Medium    Mild aortic regurgitation 05/14/2010    Priority: Medium    Hyperlipidemia 12/06/2006    Priority: Medium    Hypertension 12/06/2006    Priority: Medium    GERD (gastroesophageal reflux disease) 12/06/2006    Priority: Medium    Primary open-angle glaucoma 08/30/2016    Priority: Low   Palpitations 04/16/2015    Priority: Low   Allergic rhinitis 06/10/2014    Priority: Low   Insomnia 06/10/2014    Priority: Low   History of adenomatous polyp of colon 12/21/2006    Priority: Low    Medications- reviewed and updated Current Outpatient Medications  Medication Sig Dispense Refill   Calcium Carbonate-Vitamin D 600-400 MG-UNIT tablet Take 1 tablet by mouth daily.     dorzolamide-timolol (COSOPT) 22.3-6.8 MG/ML ophthalmic solution INTILL 1 DROP INTO BOTH EYES TWICE A DAY     estradiol (ESTRACE) 0.1 MG/GM vaginal cream Place 1 g vaginally once a week.     irbesartan (AVAPRO) 75 MG tablet TAKE 1 TABLET BY MOUTH EVERY DAY 90 tablet 3   latanoprost (XALATAN) 0.005 % ophthalmic solution Place 1 drop into both eyes at bedtime.     Multiple Vitamin (MULTIVITAMIN) capsule Take 1 capsule by mouth daily.     pantoprazole (PROTONIX) 20 MG tablet TAKE 1 TABLET BY MOUTH EVERY DAY 90 tablet 1   pantoprazole (PROTONIX) 40 MG tablet Take 1 tablet (40 mg total) by mouth daily. Office visit for further  refills 90 tablet 0   traZODone (DESYREL) 50 MG tablet TAKE 0.5-1 TABLETS BY MOUTH AT BEDTIME AS NEEDED FOR SLEEP. 90 tablet 2   tretinoin (RETIN-A) 0.1 % cream Apply topically at bedtime.     No current facility-administered medications for this visit.     Objective:  BP 124/80   Pulse 75   Temp (!) 97.3 F (36.3 C)   Ht 5\' 4"  (1.626 m)   Wt 151 lb 9.6 oz (68.8 kg)   SpO2 98%   BMI 26.02 kg/m  Gen: NAD, resting comfortably CV: RRR no murmurs rubs or gallops Lungs: CTAB no crackles, wheeze, rhonchi Joint hypertrophy of multiple joints of the hands most pronounced at left IP joint- tender to touch but without erythema    Assessment and Plan   #social update- lost husband around October 2024 and still mourning loss   % Glaucoma-follows with ophthalmology  with Dr. Cathey Endow and affects options for hand pain/arthritis   % Erosive osteoarthritis of both hands-diagnoses listed most recently by [facility] rheumatology # History of psoriasis S: History of issues with erosive osteoarthritis since she was in her 40s(trials of hydroxychloroquine and methotrexate with minimal effectiveness in the past, but with good success on prednisone). .  Patient complains of more recent flare of joint pain in both hands particular the right thumb  at IP joint- very swollen and tender without erythema.  Current flareup started last January and has not seem to come back down other than when she received a steroid shot in her left shoulder.  She has to be cautious about steroid intake with her glaucoma.  She reports history of injection in the IP joint with Dr. Amanda Pea with some but not too impressive relief.  - due to stomach issues/ GERD has to avoid aleve/ibuprofen- so takes tylenol but not helping -Voltaren gel not helping anymore. -Ophthalmology very concerned about use of prednisone with difficult to control glaucoma and they want uses of this from by them per patient  -She has been followed by rheumatology at  [facility] through 03/27/2019. -Had seen Dr. Dierdre Forth years ago with last note in system from 2016 and thought psoriatic arthritis potentially but not formally diagnosed-Notes mention inflammatory arthritis diagnosed 2014 as psoriatic arthritis versus seronegative rheumatoid arthritis -Had also seen Dr. Dareen Piano of rheumatology  A/P: Significant arthritis of the hands-listed as erosive osteoarthritis most recently by [facility] rheumatology but prior to that listed by [facility] rheumatology as "inflammatory arthritis diagnosed 2014 as psoriatic arthritis versus seronegative rheumatoid arthritis" -Referral back to dermatology but as she is aging travel is more challenging and does not want to return to [facility] - Discussed referral back to [facility] rheumatology versus referral to [facility] which typically has a faster turnaround time and with her amount of pain she prefers to see [facility] -Would like their opinion on psoriatic arthritis and potential treatments outside of prednisone or if she needs that short-term to coordinate with ophthalmology  #hypertension S: medication: Irbesartan 75 mg (cough on lisinopril in the past) A/P: well controlled continue current medications    # GERD S: Medication: pantoprazole 40 mg. A/P: Good control at this time but she reports has been instructed not to take NSAIDs by GI so we will hold off on this  Recommended follow up: Return for next already scheduled visit or sooner if needed. Future Appointments  Date Time Provider Department Center  08/16/2023 11:00 AM [facility] MM 3 [facility]  08/29/2023  9:20 AM Hilarie Fredrickson, MD [facility]  11/10/2023  1:00 PM Shelva Majestic, MD [facility]  07/09/2024  1:40 PM [facility] ANNUAL WELLNESS VISIT 1 [facility]   Lab/Order associations:   ICD-10-CM   1. Erosive osteoarthritis of both hands  M15.4 Ambulatory referral to Rheumatology    2. History of psoriasis  Z87.2  Ambulatory referral to Rheumatology    3. Primary hypertension  I10     4. Gastroesophageal reflux disease, unspecified whether esophagitis present  K21.9       No orders of the defined types were placed in this encounter.   Return precautions advised.  Tana Conch, MD

## 2023-07-19 NOTE — Assessment & Plan Note (Signed)
%   Erosive osteoarthritis of both hands-diagnoses listed most recently by [facility] rheumatology # History of psoriasis S: History of issues with erosive osteoarthritis since she was in her 40s(trials of hydroxychloroquine and methotrexate with minimal effectiveness in the past, but with good success on prednisone). .  Patient complains of more recent flare of joint pain in both hands particular the right thumb at IP joint- very swollen and tender without erythema.  Current flareup started last January and has not seem to come back down other than when she received a steroid shot in her left shoulder.  She has to be cautious about steroid intake with her glaucoma.  She reports history of injection in the IP joint with Dr. Amanda Pea with some but not too impressive relief.  - due to stomach issues/ GERD has to avoid aleve/ibuprofen- so takes tylenol but not helping -Voltaren gel not helping anymore. -Ophthalmology very concerned about use of prednisone with difficult to control glaucoma and they want uses of this from by them per patient  -She has been followed by rheumatology at [facility] through 03/27/2019. -Had seen Dr. Dierdre Forth years ago with last note in system from 2016 and thought psoriatic arthritis potentially but not formally diagnosed-Notes mention inflammatory arthritis diagnosed 2014 as psoriatic arthritis versus seronegative rheumatoid arthritis -Had also seen Dr. Dareen Piano of rheumatology  A/P: Significant arthritis of the hands-listed as erosive osteoarthritis most recently by [facility] rheumatology but prior to that listed by [facility] rheumatology as "inflammatory arthritis diagnosed 2014 as psoriatic arthritis versus seronegative rheumatoid arthritis" -Referral back to dermatology but as she is aging travel is more challenging and does not want to return to [facility] - Discussed referral back to [facility] rheumatology versus referral to [facility] which typically has a faster turnaround  time and with her amount of pain she prefers to see [facility] -Would like their opinion on psoriatic arthritis and potential treatments outside of prednisone or if she needs that short-term to coordinate with ophthalmology

## 2023-07-30 ENCOUNTER — Other Ambulatory Visit: Payer: Self-pay | Admitting: Internal Medicine

## 2023-08-15 DIAGNOSIS — H401131 Primary open-angle glaucoma, bilateral, mild stage: Secondary | ICD-10-CM | POA: Diagnosis not present

## 2023-08-15 DIAGNOSIS — Z961 Presence of intraocular lens: Secondary | ICD-10-CM | POA: Diagnosis not present

## 2023-08-16 ENCOUNTER — Ambulatory Visit
Admission: RE | Admit: 2023-08-16 | Discharge: 2023-08-16 | Disposition: A | Discharge status: Home / Self Care | Payer: Medicare Other | Source: Ambulatory Visit | Attending: Obstetrics and Gynecology | Admitting: Obstetrics and Gynecology

## 2023-08-16 DIAGNOSIS — Z1231 Encounter for screening mammogram for malignant neoplasm of breast: Secondary | ICD-10-CM

## 2023-08-25 DIAGNOSIS — R300 Dysuria: Secondary | ICD-10-CM | POA: Diagnosis not present

## 2023-08-29 ENCOUNTER — Ambulatory Visit (INDEPENDENT_AMBULATORY_CARE_PROVIDER_SITE_OTHER): Payer: Medicare Other | Admitting: Internal Medicine

## 2023-08-29 ENCOUNTER — Encounter: Payer: Self-pay | Admitting: Internal Medicine

## 2023-08-29 VITALS — BP 118/64 | HR 81 | Ht 64.0 in | Wt 153.0 lb

## 2023-08-29 DIAGNOSIS — K219 Gastro-esophageal reflux disease without esophagitis: Secondary | ICD-10-CM

## 2023-08-29 DIAGNOSIS — Z8719 Personal history of other diseases of the digestive system: Secondary | ICD-10-CM | POA: Diagnosis not present

## 2023-08-29 DIAGNOSIS — K222 Esophageal obstruction: Secondary | ICD-10-CM

## 2023-08-29 DIAGNOSIS — Z860101 Personal history of adenomatous and serrated colon polyps: Secondary | ICD-10-CM | POA: Diagnosis not present

## 2023-08-29 DIAGNOSIS — Z860100 Personal history of colon polyps, unspecified: Secondary | ICD-10-CM

## 2023-08-29 DIAGNOSIS — K2100 Gastro-esophageal reflux disease with esophagitis, without bleeding: Secondary | ICD-10-CM

## 2023-08-29 DIAGNOSIS — R1013 Epigastric pain: Secondary | ICD-10-CM

## 2023-08-29 MED ORDER — PANTOPRAZOLE SODIUM 40 MG PO TBEC: 40.0000 mg | DELAYED_RELEASE_TABLET | Freq: Every day | ORAL | 3 refills | Status: DC

## 2023-08-29 NOTE — Progress Notes (Signed)
 HISTORY OF PRESENT ILLNESS:  Destiny Acosta is a 78 y.o. female with past medical history as listed below.  She is followed in this office for GERD complicated by esophagitis and symptomatic peptic stricture requiring esophageal dilation as well as colon polyps.  The patient was last seen in this office February 12, 2022.  See that dictation.  He presents today for follow-up regarding management of her chronic GERD.  She has a number of questions about her condition, about PPIs, the need for relook endoscopy, and the need for follow-up colonoscopy.  The patient underwent upper endoscopy October 29, 2020.  At that time she was found to have esophagitis as well as a peptic stricture which was dilated with 54 [place] Maloney dilator.  She was also noted to have erosive gastritis and a hiatal hernia.  Helicobacter pylori testing was negative.  She was prescribed pantoprazole  40 mg daily.  She continues on that medication.  Despite once daily PPI, she will have occasional breakthrough symptoms.  She states that she has not had recurrent dysphagia, but notes that she chews her food very well.  She had questions regarding PPI side effects.  Her husband died last fall from dementia.  She has had prior colonoscopy in 2007, 2012, 2017, and 2022.  Her most recent colonoscopy was negative for neoplasia.  Thus, she aged out of future surveillance.  She denies lower GI complaints.  Review of blood work from December 2024 shows unremarkable comprehensive metabolic panel.  Normal CBC with hemoglobin 12.9  REVIEW OF SYSTEMS:  All non-GI ROS negative except for anxiety, arthritis, depression, fatigue, itching, skin rash, sleeping problems, heart murmur,  Past Medical History:  Diagnosis Date   Allergy    Eggplant/KIWI   Anxiety    not on meds   Aortal stenosis 05/14/2010   Arthritis    Cancer (HCC) 11/2015   skin cancer forehead; MOHS procedure 11/2015   Cataract    Colon polyps    Depression    not on meds    GERD 12/06/2006   on meds   Glaucoma    Heart murmur    aortic valve-not treatment advised (04/13/2021)   HYPERLIPIDEMIA 12/06/2006   diet controlled   Hyperplastic colon polyp    HYPERTENSION 12/06/2006   on meds   Lung granuloma (HCC)    MITRAL VALVE PROLAPSE 12/06/2006   Osteopenia    Osteoporosis    Spinal stenosis     Past Surgical History:  Procedure Laterality Date   BREAST CYST ASPIRATION     CATARACT EXTRACTION Bilateral 2020   one done in 2019   COLONOSCOPY  2017   JP-MAC-suprep(good)-SSA x 2, polyp   POLYPECTOMY  2017   SSA x 2   TUBAL LIGATION     WISDOM TOOTH EXTRACTION      Social History Destiny Acosta  reports that she has never smoked. She has never used smokeless tobacco. She reports current alcohol use. She reports that she does not use drugs.  family history includes Arthritis in her sister; Atrial fibrillation in her father; Breast cancer in an other family member; Colon cancer (age of onset: 21) in her father; Colon polyps (age of onset: 19) in her sister; Colon polyps (age of onset: 64) in her father; Diabetes in her maternal grandmother and mother; Heart disease in her maternal grandfather, maternal grandmother, paternal grandfather, and paternal grandmother; Heart disease (age of onset: 58) in her mother; Heart failure in her mother; Lung disease in her  paternal grandfather; Prostate cancer in her father; Rheum arthritis in her paternal grandmother.  Allergies  Allergen Reactions   Prednisolone Swelling   Tizanidine Anaphylaxis   Silicone Rash    Skin becomes raw    Skin becomes raw   Ace Inhibitors Cough   Ciprofloxacin  Other (See Comments)    Muscle pain   Codeine Phosphate     REACTION: nausea, vomiting   Kiwi Extract Nausea And Vomiting   Tramadol Swelling   Latex Rash       PHYSICAL EXAMINATION: Vital signs: BP 118/64   Pulse 81   Ht 5\' 4"  (1.626 m)   Wt 153 lb (69.4 kg)   BMI 26.26 kg/m   Constitutional: generally  well-appearing, no acute distress Psychiatric: alert and oriented x3, cooperative Eyes: extraocular movements intact, anicteric, conjunctiva pink Mouth: oral pharynx moist, no lesions Neck: supple no lymphadenopathy Cardiovascular: heart regular rate and rhythm, soft systolic murmur Lungs: clear to auscultation bilaterally Abdomen: soft, nontender, nondistended, no obvious ascites, no peritoneal signs, normal bowel sounds, no organomegaly Rectal: Omitted Extremities: no, cyanosis, or lower extremity edema bilaterally Skin: no lesions on visible extremities Neuro: No focal deficits.     ASSESSMENT:  1.  GERD complicated by erosive esophagitis and peptic stricture status post dilation.  No significant dysphagia by history.  She is not interested in repeat esophageal dilation. 2.  Breakthrough reflux symptoms despite once daily PPI 3.  History of erosive gastritis.  She would like to use NSAIDs occasionally for arthritis.  We would like to know if erosive changes resolved on PPI. 4.  History of sessile serrated polyps.  Aged out of surveillance   PLAN:  1.  Reflux precautions 2.  Continue pantoprazole  40 mg daily.  Medication refilled.  Medication risks reviewed in great detail. 3.  Could consider increasing PPI dosage or adding H2 receptor antagonist at night for breakthrough reflux symptoms. 4.  Upper endoscopy to further evaluate symptoms despite PPI therapy.The nature of the procedure, as well as the risks, benefits, and alternatives were carefully and thoroughly reviewed with the patient. Ample time for discussion and questions allowed. The patient understood, was satisfied, and agreed to proceed. A total time of 30 minutes was spent preparing to see the patient, obtaining comprehensive history, performing medically appropriate physical exam, counseling and educating the patient regarding the above listed issues, answering multiple questions, prescribing medication, ordering endoscopic  procedure, and documenting clinical information in the health record

## 2023-08-29 NOTE — Patient Instructions (Addendum)
 We have sent the following medications to your pharmacy for you to pick up at your convenience: Pantoprazole   You have been scheduled for an endoscopy. Please follow written instructions given to you at your visit today.  If you use inhalers (even only as needed), please bring them with you on the day of your procedure.  If you take any of the following medications, they will need to be adjusted prior to your procedure:   DO NOT TAKE 7 DAYS PRIOR TO TEST- Trulicity (dulaglutide) Ozempic, Wegovy (semaglutide) Mounjaro (tirzepatide) Bydureon Bcise (exanatide extended release)  DO NOT TAKE 1 DAY PRIOR TO YOUR TEST Rybelsus (semaglutide) Adlyxin (lixisenatide) Victoza (liraglutide) Byetta (exanatide) ___________________________________________________________________________  _______________________________________________________  If your blood pressure at your visit was 140/90 or greater, please contact your primary care physician to follow up on this.  _______________________________________________________  If you are age 44 or older, your body mass index should be between 23-30. Your Body mass index is 26.26 kg/m. If this is out of the aforementioned range listed, please consider follow up with your Primary Care Provider.  If you are age 33 or younger, your body mass index should be between 19-25. Your Body mass index is 26.26 kg/m. If this is out of the aformentioned range listed, please consider follow up with your Primary Care Provider.   ________________________________________________________  The [facility] providers would like to encourage you to use MYCHART to communicate with providers for non-urgent requests or questions.  Due to long hold times on the telephone, sending your provider a message by [facility] may be a faster and more efficient way to get a response.  Please allow 48 business hours for a response.  Please remember that this is for non-urgent requests.   _______________________________________________________

## 2023-10-04 ENCOUNTER — Encounter: Admitting: Internal Medicine

## 2023-10-06 DIAGNOSIS — M79642 Pain in left hand: Secondary | ICD-10-CM | POA: Diagnosis not present

## 2023-10-06 DIAGNOSIS — L4050 Arthropathic psoriasis, unspecified: Secondary | ICD-10-CM | POA: Diagnosis not present

## 2023-10-06 DIAGNOSIS — M79641 Pain in right hand: Secondary | ICD-10-CM | POA: Diagnosis not present

## 2023-10-06 DIAGNOSIS — M1990 Unspecified osteoarthritis, unspecified site: Secondary | ICD-10-CM | POA: Diagnosis not present

## 2023-10-06 DIAGNOSIS — K219 Gastro-esophageal reflux disease without esophagitis: Secondary | ICD-10-CM | POA: Diagnosis not present

## 2023-10-06 DIAGNOSIS — M79643 Pain in unspecified hand: Secondary | ICD-10-CM | POA: Diagnosis not present

## 2023-10-06 DIAGNOSIS — H409 Unspecified glaucoma: Secondary | ICD-10-CM | POA: Diagnosis not present

## 2023-10-11 ENCOUNTER — Telehealth: Payer: Self-pay | Admitting: Internal Medicine

## 2023-10-11 NOTE — Telephone Encounter (Addendum)
 Patient has been called all questions have been answered.

## 2023-10-11 NOTE — Telephone Encounter (Signed)
 Patient called and stated that she was wanting to know if her insurance is going to cover her EGD scheduled for tomorrow at 9:00 AM. Patient is requesting a call back. Please advise.

## 2023-10-12 ENCOUNTER — Encounter: Payer: Self-pay | Admitting: Internal Medicine

## 2023-10-12 ENCOUNTER — Ambulatory Visit (AMBULATORY_SURGERY_CENTER): Admitting: Internal Medicine

## 2023-10-12 VITALS — BP 134/81 | HR 84 | Temp 97.2°F | Resp 12 | Ht 64.0 in | Wt 153.0 lb

## 2023-10-12 DIAGNOSIS — K449 Diaphragmatic hernia without obstruction or gangrene: Secondary | ICD-10-CM | POA: Diagnosis not present

## 2023-10-12 DIAGNOSIS — I10 Essential (primary) hypertension: Secondary | ICD-10-CM | POA: Diagnosis not present

## 2023-10-12 DIAGNOSIS — K259 Gastric ulcer, unspecified as acute or chronic, without hemorrhage or perforation: Secondary | ICD-10-CM | POA: Diagnosis not present

## 2023-10-12 DIAGNOSIS — K222 Esophageal obstruction: Secondary | ICD-10-CM

## 2023-10-12 DIAGNOSIS — F32A Depression, unspecified: Secondary | ICD-10-CM | POA: Diagnosis not present

## 2023-10-12 DIAGNOSIS — K2100 Gastro-esophageal reflux disease with esophagitis, without bleeding: Secondary | ICD-10-CM

## 2023-10-12 DIAGNOSIS — E785 Hyperlipidemia, unspecified: Secondary | ICD-10-CM | POA: Diagnosis not present

## 2023-10-12 DIAGNOSIS — K219 Gastro-esophageal reflux disease without esophagitis: Secondary | ICD-10-CM | POA: Diagnosis not present

## 2023-10-12 MED ORDER — SODIUM CHLORIDE 0.9 % IV SOLN
500.0000 mL | Freq: Once | INTRAVENOUS | Status: DC
Start: 2023-10-12 — End: 2023-10-12

## 2023-10-12 NOTE — Progress Notes (Signed)
 Vss nad trans to pacu

## 2023-10-12 NOTE — Progress Notes (Signed)
 Expand All Collapse All HISTORY OF PRESENT ILLNESS:   Destiny Acosta is a 78 y.o. female with past medical history as listed below.  She is followed in this office for GERD complicated by esophagitis and symptomatic peptic stricture requiring esophageal dilation as well as colon polyps.  The patient was last seen in this office February 12, 2022.  See that dictation.   He presents today for follow-up regarding management of her chronic GERD.  She has a number of questions about her condition, about PPIs, the need for relook endoscopy, and the need for follow-up colonoscopy.   The patient underwent upper endoscopy October 29, 2020.  At that time she was found to have esophagitis as well as a peptic stricture which was dilated with 54 [place] Maloney dilator.  She was also noted to have erosive gastritis and a hiatal hernia.  Helicobacter pylori testing was negative.  She was prescribed pantoprazole  40 mg daily.  She continues on that medication.  Despite once daily PPI, she will have occasional breakthrough symptoms.  She states that she has not had recurrent dysphagia, but notes that she chews her food very well.  She had questions regarding PPI side effects.  Her husband died last fall from dementia.   She has had prior colonoscopy in 2007, 2012, 2017, and 2022.  Her most recent colonoscopy was negative for neoplasia.  Thus, she aged out of future surveillance.  She denies lower GI complaints.   Review of blood work from December 2024 shows unremarkable comprehensive metabolic panel.  Normal CBC with hemoglobin 12.9   REVIEW OF SYSTEMS:   All non-GI ROS negative except for anxiety, arthritis, depression, fatigue, itching, skin rash, sleeping problems, heart murmur,       Past Medical History:  Diagnosis Date   Allergy      Eggplant/KIWI   Anxiety      not on meds   Aortal stenosis 05/14/2010   Arthritis     Cancer (HCC) 11/2015    skin cancer forehead; MOHS procedure 11/2015   Cataract      Colon polyps     Depression      not on meds   GERD 12/06/2006    on meds   Glaucoma     Heart murmur      aortic valve-not treatment advised (04/13/2021)   HYPERLIPIDEMIA 12/06/2006    diet controlled   Hyperplastic colon polyp     HYPERTENSION 12/06/2006    on meds   Lung granuloma (HCC)     MITRAL VALVE PROLAPSE 12/06/2006   Osteopenia     Osteoporosis     Spinal stenosis                 Past Surgical History:  Procedure Laterality Date   BREAST CYST ASPIRATION       CATARACT EXTRACTION Bilateral 2020    one done in 2019   COLONOSCOPY   2017    JP-MAC-suprep(good)-SSA x 2, polyp   POLYPECTOMY   2017    SSA x 2   TUBAL LIGATION       WISDOM TOOTH EXTRACTION              Social History Destiny Acosta  reports that she has never smoked. She has never used smokeless tobacco. She reports current alcohol use. She reports that she does not use drugs.   family history includes Arthritis in her sister; Atrial fibrillation in her father; Breast cancer in an other family  member; Colon cancer (age of onset: 86) in her father; Colon polyps (age of onset: 45) in her sister; Colon polyps (age of onset: 35) in her father; Diabetes in her maternal grandmother and mother; Heart disease in her maternal grandfather, maternal grandmother, paternal grandfather, and paternal grandmother; Heart disease (age of onset: 65) in her mother; Heart failure in her mother; Lung disease in her paternal grandfather; Prostate cancer in her father; Rheum arthritis in her paternal grandmother.   Allergies       Allergies  Allergen Reactions   Prednisolone Swelling   Tizanidine Anaphylaxis   Silicone Rash      Skin becomes raw    Skin becomes raw   Ace Inhibitors Cough   Ciprofloxacin  Other (See Comments)      Muscle pain   Codeine Phosphate        REACTION: nausea, vomiting   Kiwi Extract Nausea And Vomiting   Tramadol Swelling   Latex Rash            PHYSICAL EXAMINATION: Vital  signs: BP 118/64   Pulse 81   Ht 5\' 4"  (1.626 m)   Wt 153 lb (69.4 kg)   BMI 26.26 kg/m   Constitutional: generally well-appearing, no acute distress Psychiatric: alert and oriented x3, cooperative Eyes: extraocular movements intact, anicteric, conjunctiva pink Mouth: oral pharynx moist, no lesions Neck: supple no lymphadenopathy Cardiovascular: heart regular rate and rhythm, soft systolic murmur Lungs: clear to auscultation bilaterally Abdomen: soft, nontender, nondistended, no obvious ascites, no peritoneal signs, normal bowel sounds, no organomegaly Rectal: Omitted Extremities: no, cyanosis, or lower extremity edema bilaterally Skin: no lesions on visible extremities Neuro: No focal deficits.        ASSESSMENT:   1.  GERD complicated by erosive esophagitis and peptic stricture status post dilation.  No significant dysphagia by history.  She is not interested in repeat esophageal dilation. 2.  Breakthrough reflux symptoms despite once daily PPI 3.  History of erosive gastritis.  She would like to use NSAIDs occasionally for arthritis.  We would like to know if erosive changes resolved on PPI. 4.  History of sessile serrated polyps.  Aged out of surveillance     PLAN:   1.  Reflux precautions 2.  Continue pantoprazole  40 mg daily.  Medication refilled.  Medication risks reviewed in great detail. 3.  Could consider increasing PPI dosage or adding H2 receptor antagonist at night for breakthrough reflux symptoms. 4.  Upper endoscopy to further evaluate symptoms despite PPI therapy.The nature of the procedure, as well as the risks, benefits, and alternatives were carefully and thoroughly reviewed with the patient. Ample time for discussion and questions allowed. The patient understood, was satisfied, and agreed to proceed.  Recent complete H&P as noted above.  No interval change in clinical status or physical exam.  Now for upper endoscopy.  Since the office, she did change her mind  regarding esophageal dilation should the peptic stricture be encountered.

## 2023-10-12 NOTE — Progress Notes (Signed)
 Pt's states no medical or surgical changes since previsit or office visit.

## 2023-10-12 NOTE — Patient Instructions (Addendum)
 Follow post dilation diet handout - resume medications.  Routine follow up with Dr. Elvin Hammer in 3 months - if you do not hear from us , by September, please call Dr. Alberta Almond office to schedule appt.    YOU HAD AN ENDOSCOPIC PROCEDURE TODAY AT THE  ENDOSCOPY CENTER:   Refer to the procedure report that was given to you for any specific questions about what was found during the examination.  If the procedure report does not answer your questions, please call your gastroenterologist to clarify.  If you requested that your care partner not be given the details of your procedure findings, then the procedure report has been included in a sealed envelope for you to review at your convenience later.  YOU SHOULD EXPECT: Some feelings of bloating in the abdomen. Passage of more gas than usual.  Walking can help get rid of the air that was put into your GI tract during the procedure and reduce the bloating. If you had a lower endoscopy (such as a colonoscopy or flexible sigmoidoscopy) you may notice spotting of blood in your stool or on the toilet paper. If you underwent a bowel prep for your procedure, you may not have a normal bowel movement for a few days.  Please Note:  You might notice some irritation and congestion in your nose or some drainage.  This is from the oxygen  used during your procedure.  There is no need for concern and it should clear up in a day or so.  SYMPTOMS TO REPORT IMMEDIATELY:  Following upper endoscopy (EGD)  Vomiting of blood or coffee ground material  New chest pain or pain under the shoulder blades  Painful or persistently difficult swallowing  New shortness of breath  Fever of 100F or higher  Black, tarry-looking stools  For urgent or emergent issues, a gastroenterologist can be reached at any hour by calling [phone]. Do not use MyChart messaging for urgent concerns.    DIET:  Follow post dilation handout.  Drink plenty of fluids but you should avoid alcoholic  beverages for 24 hours.  ACTIVITY:  You should plan to take it easy for the rest of today and you should NOT DRIVE or use heavy machinery until tomorrow (because of the sedation medicines used during the test).    FOLLOW UP: Our staff will call the number listed on your records the next business day following your procedure.  We will call around 7:15- 8:00 am to check on you and address any questions or concerns that you may have regarding the information given to you following your procedure. If we do not reach you, we will leave a message.     If any biopsies were taken you will be contacted by phone or by letter within the next 1-3 weeks.  Please call us  at [phone] if you have not heard about the biopsies in 3 weeks.    SIGNATURES/CONFIDENTIALITY: You and/or your care partner have signed paperwork which will be entered into your electronic medical record.  These signatures attest to the fact that that the information above on your After Visit Summary has been reviewed and is understood.  Full responsibility of the confidentiality of this discharge information lies with you and/or your care-partner.

## 2023-10-12 NOTE — Op Note (Signed)
 [facility] Patient Name: Destiny Acosta Procedure Date: 10/12/2023 10:56 AM MRN: [id number] Endoscopist: Murel Arlington. Elvin Hammer , MD, [id number] Age: 78 Referring MD:  Date of Birth: 08-24-1945 Gender: Female Account #: [id number] Procedure:                Upper GI endoscopy with balloon dilation of the                            esophagus. 18-20 mm max Indications:              Therapeutic procedure, Dysphagia, Esophageal                            reflux. Reported breakthrough symptoms in the                            office during in April 2025 visit. Now telling me                            reflux symptoms have been better controlled. Now                            request esophageal dilation if stricture present. Medicines:                Monitored Anesthesia Care Procedure:                Pre-Anesthesia Assessment:                           - Prior to the procedure, a History and Physical                            was performed, and patient medications and                            allergies were reviewed. The patient's tolerance of                            previous anesthesia was also reviewed. The risks                            and benefits of the procedure and the sedation                            options and risks were discussed with the patient.                            All questions were answered, and informed consent                            was obtained. Prior Anticoagulants: The patient has                            taken no anticoagulant or antiplatelet agents. ASA  Grade Assessment: II - A patient with mild systemic                            disease. After reviewing the risks and benefits,                            the patient was deemed in satisfactory condition to                            undergo the procedure.                           After obtaining informed consent, the endoscope was                            passed  under direct vision. Throughout the                            procedure, the patient's blood pressure, pulse, and                            oxygen  saturations were monitored continuously. The                            GIF [id number] #[id number] was introduced through the                            mouth, and advanced to the second part of duodenum.                            The upper GI endoscopy was accomplished without                            difficulty. The patient tolerated the procedure                            well. Scope In: Scope Out: Findings:                 The esophagus revealed a 15 mm ringlike stricture                            at the gastroesophageal junction (30 cm from the                            incisors). There was mild esophagitis. After                            completing the endoscopic survey, A TTS dilator was                            passed through the scope. Dilation with an 18-19-20  mm balloon dilator was performed to 20 mm. Minor                            mucosal ring disruption noted.                           The stomach moderately large hiatal hernia with a                            few scattered Cameron erosions. Stomach was                            otherwise unremarkable.                           The examined duodenum was normal.                           The cardia and gastric fundus were normal on                            retroflexion. Complications:            No immediate complications. Estimated Blood Loss:     Estimated blood loss: none. Impression:               1. GERD with mild esophagitis and peptic stricture                            status post dilation.                           2. Moderately large hiatal hernia with few Cameron                            erosions                           3. Otherwise unremarkable EGD. Recommendation:           - Patient has a contact number available for                             emergencies. The signs and symptoms of potential                            delayed complications were discussed with the                            patient. Return to normal activities tomorrow.                            Written discharge instructions were provided to the                            patient.                           -  Post dilation diet.                           - Continue present medications.                           - If reflux symptoms return, you may increase your                            pantoprazole  to twice daily until symptoms improved.                           - Routine office follow-up with Dr. Elvin Hammer in 3                            months Murel Arlington. Elvin Hammer, MD 10/12/2023 11:29:27 AM This report has been signed electronically.

## 2023-10-13 ENCOUNTER — Telehealth: Payer: Self-pay

## 2023-10-13 NOTE — Telephone Encounter (Signed)
  Follow up Call-     10/12/2023   10:17 AM 04/27/2021    7:41 AM  Call back number  Post procedure Call Back phone  # [phone]  Permission to leave phone message Yes Yes     Patient questions:  Do you have a fever, pain , or abdominal swelling? No. Pain Score  0 *  Have you tolerated food without any problems? Yes.    Have you been able to return to your normal activities? Yes.    Do you have any questions about your discharge instructions: Diet   No. Medications  No. Follow up visit  No.  Do you have questions or concerns about your Care? No.  Actions: * If pain score is 4 or above: No action needed, pain <4.  Pt states that she has several sores in her mouth that appeared after the procedure. States she rinsed her mouth out with hydrogen peroxide last night and that the sores look and feel better today. Advised the pt to call back if they became painful, did not go away, or worsened.

## 2023-10-31 ENCOUNTER — Ambulatory Visit (INDEPENDENT_AMBULATORY_CARE_PROVIDER_SITE_OTHER): Admitting: Family Medicine

## 2023-10-31 ENCOUNTER — Encounter: Payer: Self-pay | Admitting: Family Medicine

## 2023-10-31 VITALS — BP 116/64 | HR 65 | Temp 97.2°F | Ht 64.0 in | Wt 152.2 lb

## 2023-10-31 DIAGNOSIS — L4050 Arthropathic psoriasis, unspecified: Secondary | ICD-10-CM

## 2023-10-31 DIAGNOSIS — I10 Essential (primary) hypertension: Secondary | ICD-10-CM | POA: Diagnosis not present

## 2023-10-31 DIAGNOSIS — B9689 Other specified bacterial agents as the cause of diseases classified elsewhere: Secondary | ICD-10-CM | POA: Diagnosis not present

## 2023-10-31 DIAGNOSIS — J329 Chronic sinusitis, unspecified: Secondary | ICD-10-CM | POA: Diagnosis not present

## 2023-10-31 MED ORDER — AMOXICILLIN-POT CLAVULANATE 875-125 MG PO TABS: 1.0000 | ORAL_TABLET | Freq: Two times a day (BID) | ORAL | 0 refills | Status: AC

## 2023-10-31 NOTE — Patient Instructions (Addendum)
 3 months of right sided sinus irritation and pressure at least intermittently worse over last 2 weeks. Had some improvement with allegra and I want her to continue that but to cover for bacterial sinusitis also send in Augmentin  today to her pharmacy  - postpone any procedure at least 10-14 days  Recommended follow up: Return for as needed for new, worsening, persistent symptoms. Could push next visit out 6 months if you would like- we will do labs towards end of year. Maybe give until July 1st and cancel if you are feeling better for the July 3 visit

## 2023-10-31 NOTE — Progress Notes (Signed)
 Phone [phone] In person visit   Subjective:   Destiny Acosta is a 78 y.o. year old very pleasant female patient who presents for/with See problem oriented charting Chief Complaint  Patient presents with   Facial Pain    Past Medical History-  Patient Active Problem List   Diagnosis Date Noted   Spinal stenosis of lumbar region 01/14/2016    Priority: Medium    Glaucoma 04/16/2015    Priority: Medium    Suprapubic discomfort 09/17/2014    Priority: Medium    Erosive osteoarthritis of both hands 08/10/2011    Priority: Medium    Mild aortic regurgitation 05/14/2010    Priority: Medium    Hyperlipidemia 12/06/2006    Priority: Medium    Hypertension 12/06/2006    Priority: Medium    GERD (gastroesophageal reflux disease) 12/06/2006    Priority: Medium    Primary open-angle glaucoma 08/30/2016    Priority: Low   Palpitations 04/16/2015    Priority: Low   Allergic rhinitis 06/10/2014    Priority: Low   Insomnia 06/10/2014    Priority: Low   History of adenomatous polyp of colon 12/21/2006    Priority: Low    Medications- reviewed and updated Current Outpatient Medications  Medication Sig Dispense Refill   amoxicillin -clavulanate (AUGMENTIN ) 875-125 MG tablet Take 1 tablet by mouth 2 (two) times daily for 7 days. 14 tablet 0   Calcium Carbonate-Vitamin D  600-400 MG-UNIT tablet Take 1 tablet by mouth daily.     dorzolamide-timolol (COSOPT) 22.3-6.8 MG/ML ophthalmic solution INTILL 1 DROP INTO BOTH EYES TWICE A DAY     estradiol (ESTRACE) 0.1 MG/GM vaginal cream Place 1 g vaginally once a week.     irbesartan  (AVAPRO ) 75 MG tablet TAKE 1 TABLET BY MOUTH EVERY DAY 90 tablet 3   latanoprost (XALATAN) 0.005 % ophthalmic solution Place 1 drop into both eyes at bedtime.     Multiple Vitamin (MULTIVITAMIN) capsule Take 1 capsule by mouth daily.     pantoprazole  (PROTONIX ) 20 MG tablet TAKE 1 TABLET BY MOUTH EVERY DAY 90 tablet 0   pantoprazole  (PROTONIX ) 40 MG tablet  Take 1 tablet (40 mg total) by mouth daily. 90 tablet 3   tretinoin (RETIN-A) 0.1 % cream Apply topically at bedtime.     No current facility-administered medications for this visit.     Objective:  BP 116/64   Pulse 65   Temp (!) 97.2 F (36.2 C)   Ht 5' 4 (1.626 m)   Wt 152 lb 3.2 oz (69 kg)   SpO2 92%   BMI 26.13 kg/m  Gen: NAD, resting comfortably Right-sided maxillary sinus tenderness noted.  Mild right frontal sinus tenderness-none on the left.  Nasal turbinates erythematous and edematous with minimal discharge.  Pharynx with mild drainage CV: RRR no murmurs rubs or gallops Lungs: CTAB no crackles, wheeze, rhonchi Ext: no edema Skin: warm, dry     Assessment and Plan    # right sided facial pain and sinus pressure S: patient had cosentyx set up and went in to get injection about 2 weeks ago. Was having right sided facial pain - forehead, cheeck, jaw, and into the neck- was very tender to palpation- could not even lay on that side. They wanted to make sure no sinus infection before treatment. Reports since last visit in march has had intermittent maxillary sinus pressure. Also has dental appointment on Wednesday to make sure not a dental issue. They wanted her to be cleared of infection  first- they advised allegra and 2 extra strength tylenol and she felt much better.  -took antibiotic(s) a month ago from gynecology for urinary tract infection- not sure of name  Continues to feel pressure in right cheek. Has not allegra in 4 days and noted a fair amount of left sided drainage from nose. No fever or chills. One night did get a headache(s) along with the facial pain. Feels congested today. No cough. No vision change.  No shortness of breath reported.  A/P: 3 months of right sided sinus irritation and pressure at least intermittently worse over last 2 weeks. Had some improvement with allegra and I want her to continue that but to cover for bacterial sinusitis also send in  Augmentin  today to her pharmacy  - postpone any Cosentyx at least 10-14 days and make sure she is feeling better -May have some baseline allergies as well as noted and hoping Allegra helps -Slight jaw click-may have had a TMJ flare at time of more intense pain as well  #hypertension S: medication: Irbesartan  75 mg (cough on lisinopril  in the past) BP Readings from Last 3 Encounters:  10/31/23 116/64  10/12/23 134/81  08/29/23 118/64  A/P: well controlled continue current medications    # Psoriatic arthritis-continues to be an issue and apparently also has a psoriatic plaque on her head-they are hoping Cosentyx will help with both issues   Recommended follow up: Return for as needed for new, worsening, persistent symptoms.  She may push out her July 3 visit Future Appointments  Date Time Provider Department Center  11/10/2023  1:00 PM Katrinka Garnette KIDD, MD [facility]  07/09/2024  1:40 PM [facility] ANNUAL WELLNESS VISIT 1 [facility]    Lab/Order associations:   ICD-10-CM   1. Bacterial sinusitis  J32.9    B96.89     2. Primary hypertension  I10     3. Psoriatic arthritis (HCC)  L40.50       Meds ordered this encounter  Medications   amoxicillin -clavulanate (AUGMENTIN ) 875-125 MG tablet    Sig: Take 1 tablet by mouth 2 (two) times daily for 7 days.    Dispense:  14 tablet    Refill:  0    Return precautions advised.  Garnette Katrinka, MD

## 2023-11-01 ENCOUNTER — Telehealth: Payer: Self-pay

## 2023-11-01 MED ORDER — DOXYCYCLINE HYCLATE 100 MG PO TABS: 100.0000 mg | ORAL_TABLET | Freq: Two times a day (BID) | ORAL | 0 refills | Status: AC

## 2023-11-01 NOTE — Telephone Encounter (Signed)
 I apologize-can you clarify on what her allergy was to the Augmentin  this time and list the Augmentin  as an allergy with the listed effect

## 2023-11-01 NOTE — Telephone Encounter (Signed)
 Spoke with pt and she states she thinks she was allergic to penicillin as a child but has not been tested recently. Pt is ok With trying doxycycline and was advised to stay out of the sun.

## 2023-11-01 NOTE — Telephone Encounter (Signed)
 What is her allergy-we need this information so it can be listed under her allergies  That she tolerated doxycycline?  We could consider this but she would have to stay out of the sun while using it as can cause photosensitive rash with prolonged sun exposure

## 2023-11-01 NOTE — Telephone Encounter (Signed)
 Copied from CRM [id number]. Topic: Clinical - Medication Question >> Oct 31, 2023  2:44 PM Lavanda D wrote: Reason for CRM: Patient is calling because she is allergic to the antibiotic that Dr. Katrinka prescribed and is wondering if there's an alternative prescription that he can offer.

## 2023-11-03 NOTE — Telephone Encounter (Signed)
Called and lm for pt tcb. 

## 2023-11-10 ENCOUNTER — Ambulatory Visit: Payer: Medicare Other | Admitting: Family Medicine

## 2023-11-15 DIAGNOSIS — L4050 Arthropathic psoriasis, unspecified: Secondary | ICD-10-CM | POA: Diagnosis not present

## 2023-11-16 DIAGNOSIS — M7989 Other specified soft tissue disorders: Secondary | ICD-10-CM | POA: Diagnosis not present

## 2023-11-16 DIAGNOSIS — M79643 Pain in unspecified hand: Secondary | ICD-10-CM | POA: Diagnosis not present

## 2023-11-16 DIAGNOSIS — H409 Unspecified glaucoma: Secondary | ICD-10-CM | POA: Diagnosis not present

## 2023-11-16 DIAGNOSIS — K259 Gastric ulcer, unspecified as acute or chronic, without hemorrhage or perforation: Secondary | ICD-10-CM | POA: Diagnosis not present

## 2023-11-16 DIAGNOSIS — L4050 Arthropathic psoriasis, unspecified: Secondary | ICD-10-CM | POA: Diagnosis not present

## 2023-11-16 DIAGNOSIS — Z79899 Other long term (current) drug therapy: Secondary | ICD-10-CM | POA: Diagnosis not present

## 2023-11-16 DIAGNOSIS — L409 Psoriasis, unspecified: Secondary | ICD-10-CM | POA: Diagnosis not present

## 2023-11-16 DIAGNOSIS — M1990 Unspecified osteoarthritis, unspecified site: Secondary | ICD-10-CM | POA: Diagnosis not present

## 2023-11-29 ENCOUNTER — Telehealth: Payer: Self-pay | Admitting: Internal Medicine

## 2023-11-29 NOTE — Telephone Encounter (Signed)
 Pt scheduled for f/u OV 01/16/24@4pm . She wanted to check with Dr Abran and see if he thought she needed to be seen sooner due to her potassium being elevated. She wondered if this may be related to protonix . She is also getting tx for psoriatic arthritis. Please advise.

## 2023-11-29 NOTE — Telephone Encounter (Signed)
 Her pantoprazole  has no bearing on potassium. Any issues regarding her electrolyte abnormality should be addressed with her PCP. Keep GI follow-up as planned.  No changes from GI perspective

## 2023-11-29 NOTE — Telephone Encounter (Signed)
 Patient called today stating she was supposed to make a follow-up appointment with Dr. Abran in 3 months (September).  However, recent blood work has shown a critical potassium level and she is concerned and feels she probably needs to be seen sooner rather than later.  She is also concerned that it may be from the Protonix .  Please call patient and advise.  Thank you.

## 2023-11-30 NOTE — Telephone Encounter (Signed)
 Spoke with pt and she is aware of OV and Dr Nancyann recommendations. She wanted to let Dr Abran know she was just trying to figure out why her kidneys are not working as well. She had been reading online.

## 2023-12-13 DIAGNOSIS — L4050 Arthropathic psoriasis, unspecified: Secondary | ICD-10-CM | POA: Diagnosis not present

## 2023-12-19 ENCOUNTER — Ambulatory Visit (INDEPENDENT_AMBULATORY_CARE_PROVIDER_SITE_OTHER): Admitting: Family Medicine

## 2023-12-19 ENCOUNTER — Encounter: Payer: Self-pay | Admitting: Family Medicine

## 2023-12-19 VITALS — BP 128/88 | HR 76 | Temp 98.1°F | Ht 64.0 in | Wt 149.0 lb

## 2023-12-19 DIAGNOSIS — Z131 Encounter for screening for diabetes mellitus: Secondary | ICD-10-CM | POA: Diagnosis not present

## 2023-12-19 DIAGNOSIS — E663 Overweight: Secondary | ICD-10-CM

## 2023-12-19 DIAGNOSIS — K219 Gastro-esophageal reflux disease without esophagitis: Secondary | ICD-10-CM

## 2023-12-19 DIAGNOSIS — I10 Essential (primary) hypertension: Secondary | ICD-10-CM

## 2023-12-19 DIAGNOSIS — E875 Hyperkalemia: Secondary | ICD-10-CM

## 2023-12-19 NOTE — Addendum Note (Signed)
 Addended by: KATRINKA GARNETTE KIDD on: 12/19/2023 05:28 PM   Modules accepted: Level of Service

## 2023-12-19 NOTE — Progress Notes (Signed)
 Phone [phone] In person visit   Subjective:   Destiny Acosta is a 78 y.o. year old very pleasant female patient who presents for/with See problem oriented charting Chief Complaint  Patient presents with   Medical Management of Chronic Issues    Has started Cosentyx  infusions;     Past Medical History-  Patient Active Problem List   Diagnosis Date Noted   Spinal stenosis of lumbar region 01/14/2016    Priority: Medium    Glaucoma 04/16/2015    Priority: Medium    Suprapubic discomfort 09/17/2014    Priority: Medium    Erosive osteoarthritis of both hands 08/10/2011    Priority: Medium    Mild aortic regurgitation 05/14/2010    Priority: Medium    Hyperlipidemia 12/06/2006    Priority: Medium    Hypertension 12/06/2006    Priority: Medium    GERD (gastroesophageal reflux disease) 12/06/2006    Priority: Medium    Primary open-angle glaucoma 08/30/2016    Priority: Low   Palpitations 04/16/2015    Priority: Low   Allergic rhinitis 06/10/2014    Priority: Low   Insomnia 06/10/2014    Priority: Low   History of adenomatous polyp of colon 12/21/2006    Priority: Low    Medications- reviewed and updated Current Outpatient Medications  Medication Sig Dispense Refill   Calcium Carbonate-Vitamin D  600-400 MG-UNIT tablet Take 1 tablet by mouth daily.     dorzolamide-timolol (COSOPT) 22.3-6.8 MG/ML ophthalmic solution INTILL 1 DROP INTO BOTH EYES TWICE A DAY     estradiol (ESTRACE) 0.1 MG/GM vaginal cream Place 1 g vaginally once a week.     irbesartan  (AVAPRO ) 75 MG tablet TAKE 1 TABLET BY MOUTH EVERY DAY 90 tablet 3   latanoprost (XALATAN) 0.005 % ophthalmic solution Place 1 drop into both eyes at bedtime.     pantoprazole  (PROTONIX ) 20 MG tablet TAKE 1 TABLET BY MOUTH EVERY DAY 90 tablet 0   pantoprazole  (PROTONIX ) 40 MG tablet Take 1 tablet (40 mg total) by mouth daily. 90 tablet 3   tretinoin (RETIN-A) 0.1 % cream Apply topically at bedtime.     Multiple Vitamin  (MULTIVITAMIN) capsule Take 1 capsule by mouth daily. (Patient not taking: Reported on 12/19/2023)     No current facility-administered medications for this visit.     Objective:  BP 128/88   Pulse 76   Temp 98.1 F (36.7 C) (Temporal)   Ht 5' 4 (1.626 m)   Wt 149 lb (67.6 kg)   SpO2 96%   BMI 25.58 kg/m  Gen: NAD, resting comfortably CV: RRR no murmurs rubs or gallops Lungs: CTAB no crackles, wheeze, rhonchi Abdomen: soft/nontender/nondistended/normal bowel sounds. No rebound or guarding.  Ext: minimal edema, varicose veins noted Skin: warm, dry     Assessment and Plan   # Hyperkalemia S:did bloodwork with Dr. Curt and was told potassium was high before starting cosentyx  . Tried to reduce potassium rich foods. Feels could hydrate better. A/P: We discussed that her irbesartan  could increase risk of hyperkalemia but there is also a very real possibility this could be false positive-we will update CMP today and discussed option of adjusting her blood pressure medicine as well  #hypertension S: medication: Irbesartan  75 mg (cough on lisinopril  in the past) Home readings #s: No recent checks -Reports blood pressure readings at other healthcare offices have been up into the 140s but diastolic controlled  BP Readings from Last 3 Encounters:  12/19/23 128/88  10/31/23 116/64  10/12/23  134/81  A/P: Blood pressure high acceptable diastolic-plus her systolic readings have been elevated in other offices.  We are considering irbesartan  150-hydrochlorothiazide 12.5 mg and only taking half dose to try to slightly bring down blood pressure but also lower potassium if needed  # GERD S: Medication: pantoprazole  40 mg-has not tolerated being on 20 mg dose long-term A/P: Patient has several concerns about long-term PPI therapy-she was most concerned that it could be affecting her kidney function thus causing elevated potassium-based on the limited notes I have I do not think that is the  case-I would have her continue current medication since it is controlling her reflux.  I do think this may increase her risk of osteoporosis on 1 hand but on the other hand we need to control her reflux and this has been the recommended dose by GI  % Erosive osteoarthritis of both hands-follows at [facility].  They state not osteoarthritis.  Dr. Mai in the past had thought psoriatic arthritis.  Had also been followed by Dr. Lenon of rheumatology -has had plaque psoriasis in scalp -Dr. Leni in 2025 reports psoriatic arthritistreating with Cosentyx - we are hoping for good results with upcoming treatment  Recommended follow up: Return for next already scheduled visit or sooner if needed. Future Appointments  Date Time Provider Department Center  01/16/2024  4:00 PM Destiny Norleen SAILOR, MD [facility]  04/13/2024 11:00 AM Destiny Garnette KIDD, MD [facility]  07/09/2024  1:40 PM [facility] ANNUAL WELLNESS VISIT 1 [facility]    Lab/Order associations:   ICD-10-CM   1. Hyperkalemia  E87.5 Comp Met (CMET)    2. Primary hypertension  I10 Comp Met (CMET)    3. Overweight  E66.3     4. Screening for diabetes mellitus  Z13.1 HgB A1c      No orders of the defined types were placed in this encounter.   Return precautions advised.  Garnette Katrinka, MD

## 2023-12-19 NOTE — Patient Instructions (Addendum)
 Please stop by lab before you go If you have mychart- we will send your results within 3 business days of us  receiving them.  If you do not have mychart- we will call you about results within 5 business days of us  receiving them.  *please also note that you will see labs on mychart as soon as they post. I will later go in and write notes on them- will say notes from Dr. Katrinka   I suspect false positive high potassium but also could be the irbesartan  and I may use a combo pill depending on results today   Recommended follow up: Return for next already scheduled visit or sooner if needed.

## 2023-12-19 NOTE — Progress Notes (Signed)
 Phone [phone] In person visit   Subjective:   Destiny Acosta is a 78 y.o. year old very pleasant female patient who presents for/with See problem oriented charting Chief Complaint  Patient presents with   Medical Management of Chronic Issues    Has started Cosentyx  infusions;     Past Medical History-  Patient Active Problem List   Diagnosis Date Noted   Spinal stenosis of lumbar region 01/14/2016    Priority: Medium    Glaucoma 04/16/2015    Priority: Medium    Suprapubic discomfort 09/17/2014    Priority: Medium    Erosive osteoarthritis of both hands 08/10/2011    Priority: Medium    Mild aortic regurgitation 05/14/2010    Priority: Medium    Hyperlipidemia 12/06/2006    Priority: Medium    Hypertension 12/06/2006    Priority: Medium    GERD (gastroesophageal reflux disease) 12/06/2006    Priority: Medium    Primary open-angle glaucoma 08/30/2016    Priority: Low   Palpitations 04/16/2015    Priority: Low   Allergic rhinitis 06/10/2014    Priority: Low   Insomnia 06/10/2014    Priority: Low   History of adenomatous polyp of colon 12/21/2006    Priority: Low    Medications- reviewed and updated Current Outpatient Medications  Medication Sig Dispense Refill   Calcium Carbonate-Vitamin D  600-400 MG-UNIT tablet Take 1 tablet by mouth daily.     dorzolamide-timolol (COSOPT) 22.3-6.8 MG/ML ophthalmic solution INTILL 1 DROP INTO BOTH EYES TWICE A DAY     estradiol (ESTRACE) 0.1 MG/GM vaginal cream Place 1 g vaginally once a week.     irbesartan  (AVAPRO ) 75 MG tablet TAKE 1 TABLET BY MOUTH EVERY DAY 90 tablet 3   latanoprost (XALATAN) 0.005 % ophthalmic solution Place 1 drop into both eyes at bedtime.     pantoprazole  (PROTONIX ) 20 MG tablet TAKE 1 TABLET BY MOUTH EVERY DAY 90 tablet 0   pantoprazole  (PROTONIX ) 40 MG tablet Take 1 tablet (40 mg total) by mouth daily. 90 tablet 3   tretinoin (RETIN-A) 0.1 % cream Apply topically at bedtime.     Multiple Vitamin  (MULTIVITAMIN) capsule Take 1 capsule by mouth daily. (Patient not taking: Reported on 12/19/2023)     No current facility-administered medications for this visit.     Objective:  BP 128/88   Pulse 76   Temp 98.1 F (36.7 C) (Temporal)   Ht 5' 4 (1.626 m)   Wt 149 lb (67.6 kg)   SpO2 96%   BMI 25.58 kg/m  Gen: NAD, resting comfortably CV: RRR no murmurs rubs or gallops Lungs: CTAB no crackles, wheeze, rhonchi Abdomen: soft/nontender/nondistended/normal bowel sounds. No rebound or guarding.  Ext: minimal edema, varicose veins noted Skin: warm, dry     Assessment and Plan   # Hyperkalemia S:did bloodwork with Dr. Curt and was told potassium was high before starting cosentyx  . Tried to reduce potassium rich foods. Feels could hydrate better. A/P: We discussed that her irbesartan  could increase risk of hyperkalemia but there is also a very real possibility this could be false positive-we will update CMP today and discussed option of adjusting her blood pressure medicine as well  #hypertension S: medication: Irbesartan  75 mg (cough on lisinopril  in the past) Home readings #s: No recent checks -Reports blood pressure readings at other healthcare offices have been up into the 140s but diastolic controlled  BP Readings from Last 3 Encounters:  12/19/23 128/88  10/31/23 116/64  10/12/23  134/81  A/P: Blood pressure high acceptable diastolic-plus her systolic readings have been elevated in other offices.  We are considering irbesartan  150-hydrochlorothiazide 12.5 mg and only taking half dose to try to slightly bring down blood pressure but also lower potassium if needed  # GERD S: Medication: pantoprazole  40 mg-has not tolerated being on 20 mg dose long-term A/P: Patient has several concerns about long-term PPI therapy-she was most concerned that it could be affecting her kidney function thus causing elevated potassium-based on the limited notes I have I do not think that is the  case-I would have her continue current medication since it is controlling her reflux.  I do think this may increase her risk of osteoporosis on 1 hand but on the other hand we need to control her reflux and this has been the recommended dose by GI  % Erosive osteoarthritis of both hands-follows at [facility].  They state not osteoarthritis.  Dr. Mai in the past had thought psoriatic arthritis.  Had also been followed by Dr. Lenon of rheumatology -has had plaque psoriasis in scalp -Dr. Leni in 2025 reports psoriatic arthritistreating with Cosentyx - we are hoping for good results with upcoming treatment  Recommended follow up: Return for next already scheduled visit or sooner if needed. Future Appointments  Date Time Provider Department Center  01/16/2024  4:00 PM Abran Norleen SAILOR, MD [facility]  04/13/2024 11:00 AM Katrinka Garnette KIDD, MD [facility]  07/09/2024  1:40 PM [facility] ANNUAL WELLNESS VISIT 1 [facility]    Lab/Order associations:   ICD-10-CM   1. Hyperkalemia  E87.5 Comp Met (CMET)    2. Primary hypertension  I10 Comp Met (CMET)    3. Overweight  E66.3     4. Screening for diabetes mellitus  Z13.1 HgB A1c      No orders of the defined types were placed in this encounter.   Return precautions advised.  Garnette Katrinka, MD

## 2023-12-20 ENCOUNTER — Ambulatory Visit: Payer: Self-pay | Admitting: Family Medicine

## 2023-12-20 LAB — COMPREHENSIVE METABOLIC PANEL WITH GFR
ALT: 16 U/L (ref 0–35)
AST: 20 U/L (ref 0–37)
Albumin: 4.4 g/dL (ref 3.5–5.2)
Alkaline Phosphatase: 61 U/L (ref 39–117)
BUN: 12 mg/dL (ref 6–23)
CO2: 27 meq/L (ref 19–32)
Calcium: 9.2 mg/dL (ref 8.4–10.5)
Chloride: 103 meq/L (ref 96–112)
Creatinine, Ser: 0.47 mg/dL (ref 0.40–1.20)
GFR: 90.71 mL/min (ref >60.00)
Glucose, Bld: 87 mg/dL (ref 70–99)
Potassium: 3.9 meq/L (ref 3.5–5.1)
Sodium: 138 meq/L (ref 135–145)
Total Bilirubin: 0.7 mg/dL (ref 0.2–1.2)
Total Protein: 7.3 g/dL (ref 6.0–8.3)

## 2023-12-20 LAB — HEMOGLOBIN A1C: Hgb A1c MFr Bld: 5.9 % (ref 4.6–6.5)

## 2023-12-26 DIAGNOSIS — H04123 Dry eye syndrome of bilateral lacrimal glands: Secondary | ICD-10-CM | POA: Diagnosis not present

## 2023-12-26 DIAGNOSIS — H401131 Primary open-angle glaucoma, bilateral, mild stage: Secondary | ICD-10-CM | POA: Diagnosis not present

## 2024-01-06 ENCOUNTER — Ambulatory Visit: Admitting: Family Medicine

## 2024-01-10 DIAGNOSIS — L4050 Arthropathic psoriasis, unspecified: Secondary | ICD-10-CM | POA: Diagnosis not present

## 2024-01-16 ENCOUNTER — Ambulatory Visit: Admitting: Internal Medicine

## 2024-01-26 DIAGNOSIS — Z23 Encounter for immunization: Secondary | ICD-10-CM | POA: Diagnosis not present

## 2024-01-31 ENCOUNTER — Other Ambulatory Visit (HOSPITAL_BASED_OUTPATIENT_CLINIC_OR_DEPARTMENT_OTHER): Payer: Self-pay | Admitting: Obstetrics and Gynecology

## 2024-01-31 DIAGNOSIS — E2839 Other primary ovarian failure: Secondary | ICD-10-CM

## 2024-02-07 DIAGNOSIS — L4050 Arthropathic psoriasis, unspecified: Secondary | ICD-10-CM | POA: Diagnosis not present

## 2024-02-13 DIAGNOSIS — M25542 Pain in joints of left hand: Secondary | ICD-10-CM | POA: Diagnosis not present

## 2024-02-13 DIAGNOSIS — M25541 Pain in joints of right hand: Secondary | ICD-10-CM | POA: Diagnosis not present

## 2024-02-13 DIAGNOSIS — M154 Erosive (osteo)arthritis: Secondary | ICD-10-CM | POA: Diagnosis not present

## 2024-02-13 DIAGNOSIS — M2550 Pain in unspecified joint: Secondary | ICD-10-CM | POA: Diagnosis not present

## 2024-02-13 DIAGNOSIS — R21 Rash and other nonspecific skin eruption: Secondary | ICD-10-CM | POA: Diagnosis not present

## 2024-02-13 DIAGNOSIS — Z79899 Other long term (current) drug therapy: Secondary | ICD-10-CM | POA: Diagnosis not present

## 2024-02-13 DIAGNOSIS — M064 Inflammatory polyarthropathy: Secondary | ICD-10-CM | POA: Diagnosis not present

## 2024-02-20 DIAGNOSIS — K259 Gastric ulcer, unspecified as acute or chronic, without hemorrhage or perforation: Secondary | ICD-10-CM | POA: Diagnosis not present

## 2024-02-20 DIAGNOSIS — M79643 Pain in unspecified hand: Secondary | ICD-10-CM | POA: Diagnosis not present

## 2024-02-20 DIAGNOSIS — L4050 Arthropathic psoriasis, unspecified: Secondary | ICD-10-CM | POA: Diagnosis not present

## 2024-02-20 DIAGNOSIS — M7989 Other specified soft tissue disorders: Secondary | ICD-10-CM | POA: Diagnosis not present

## 2024-02-20 DIAGNOSIS — H409 Unspecified glaucoma: Secondary | ICD-10-CM | POA: Diagnosis not present

## 2024-02-20 DIAGNOSIS — Z79899 Other long term (current) drug therapy: Secondary | ICD-10-CM | POA: Diagnosis not present

## 2024-02-20 DIAGNOSIS — M8580 Other specified disorders of bone density and structure, unspecified site: Secondary | ICD-10-CM | POA: Diagnosis not present

## 2024-02-20 DIAGNOSIS — M810 Age-related osteoporosis without current pathological fracture: Secondary | ICD-10-CM | POA: Diagnosis not present

## 2024-02-20 DIAGNOSIS — L409 Psoriasis, unspecified: Secondary | ICD-10-CM | POA: Diagnosis not present

## 2024-02-20 DIAGNOSIS — M1990 Unspecified osteoarthritis, unspecified site: Secondary | ICD-10-CM | POA: Diagnosis not present

## 2024-03-06 DIAGNOSIS — L4050 Arthropathic psoriasis, unspecified: Secondary | ICD-10-CM | POA: Diagnosis not present

## 2024-03-26 ENCOUNTER — Ambulatory Visit: Admitting: Internal Medicine

## 2024-03-26 ENCOUNTER — Encounter: Payer: Self-pay | Admitting: Internal Medicine

## 2024-03-26 VITALS — BP 104/70 | HR 87 | Ht 64.0 in | Wt 147.0 lb

## 2024-03-26 DIAGNOSIS — K219 Gastro-esophageal reflux disease without esophagitis: Secondary | ICD-10-CM

## 2024-03-26 DIAGNOSIS — Z860100 Personal history of colon polyps, unspecified: Secondary | ICD-10-CM

## 2024-03-26 DIAGNOSIS — K222 Esophageal obstruction: Secondary | ICD-10-CM | POA: Diagnosis not present

## 2024-03-26 MED ORDER — PANTOPRAZOLE SODIUM 40 MG PO TBEC: 40.0000 mg | DELAYED_RELEASE_TABLET | Freq: Every day | ORAL | 3 refills | Status: AC

## 2024-03-26 NOTE — Patient Instructions (Signed)
 We have sent the following medications to your pharmacy for you to pick up at your convenience:  Pantoprazole .  Please follow up in one year.  _______________________________________________________  If your blood pressure at your visit was 140/90 or greater, please contact your primary care physician to follow up on this.  _______________________________________________________  If you are age 79 or older, your body mass index should be between 23-30. Your Body mass index is 25.23 kg/m. If this is out of the aforementioned range listed, please consider follow up with your Primary Care Provider.  If you are age 55 or younger, your body mass index should be between 19-25. Your Body mass index is 25.23 kg/m. If this is out of the aformentioned range listed, please consider follow up with your Primary Care Provider.   ________________________________________________________  The [facility] providers would like to encourage you to use MYCHART to communicate with providers for non-urgent requests or questions.  Due to long hold times on the telephone, sending your provider a message by [facility] may be a faster and more efficient way to get a response.  Please allow 48 business hours for a response.  Please remember that this is for non-urgent requests.  _______________________________________________________  [facility] is using a team-based approach to care.  Your team is made up of your doctor and two to three APPS. Our APPS (Nurse Practitioners and Physician Assistants) work with your physician to ensure care continuity for you. They are fully qualified to address your health concerns and develop a treatment plan. They communicate directly with your gastroenterologist to care for you. Seeing the Advanced Practice Practitioners on your physician's team can help you by facilitating care more promptly, often allowing for earlier appointments, access to diagnostic testing, procedures, and  other specialty referrals.

## 2024-03-26 NOTE — Progress Notes (Signed)
 HISTORY OF PRESENT ILLNESS:  Destiny Acosta is a 78 y.o. female with multiple medical problems as listed below.  She was last seen in this office August 29, 2023 regarding GERD with breakthrough symptoms and dysphagia.  See that dictation for details.  She subsequently underwent upper endoscopy October 12, 2023.  She was found to have an esophageal stricture which was dilated to 20 mm via balloon.  She was continued on pantoprazole  40 mg daily and follows up at this time.  Patient tells me that with strict adherence to reflux precautions and compliance with her medication, she does well.  She will occasionally have nocturnal reflux or regurgitation if eating too close to bedtime.  Otherwise she is pleased.  No further issues with dysphagia.  She does tell me that after being treated with antibiotic for a UTI, this seemed to help her gut calm down.  REVIEW OF SYSTEMS:  All non-GI ROS negative except for sinus trouble, arthritis, back pain, fatigue, itching, hearing problems, skin rash, sleeping problems, urinary frequency, urinary leakage  Past Medical History:  Diagnosis Date   Allergy    Eggplant/KIWI   Anxiety    not on meds   Aortal stenosis 05/14/2010   Arthritis    Cancer (HCC) 11/2015   skin cancer forehead; MOHS procedure 11/2015   Cataract    Colon polyps    Depression    not on meds   GERD 12/06/2006   on meds   Glaucoma    Heart murmur    aortic valve-not treatment advised (04/13/2021)   HYPERLIPIDEMIA 12/06/2006   diet controlled   Hyperplastic colon polyp    HYPERTENSION 12/06/2006   on meds   Lung granuloma (HCC)    MITRAL VALVE PROLAPSE 12/06/2006   Osteopenia    Osteoporosis    Spinal stenosis     Past Surgical History:  Procedure Laterality Date   BREAST CYST ASPIRATION     CATARACT EXTRACTION Bilateral 2020   one done in 2019   COLONOSCOPY  2017   JP-MAC-suprep(good)-SSA x 2, polyp   POLYPECTOMY  2017   SSA x 2   TUBAL LIGATION     WISDOM TOOTH  EXTRACTION      Social History Destiny Acosta  reports that she has never smoked. She has never used smokeless tobacco. She reports current alcohol use. She reports that she does not use drugs.  family history includes Arthritis in her sister; Atrial fibrillation in her father; Breast cancer in an other family member; Colon cancer (age of onset: 74) in her father; Colon polyps (age of onset: 52) in her sister; Colon polyps (age of onset: 43) in her father; Diabetes in her maternal grandmother and mother; Heart disease in her maternal grandfather, maternal grandmother, paternal grandfather, and paternal grandmother; Heart disease (age of onset: 41) in her mother; Heart failure in her mother; Lung disease in her paternal grandfather; Prostate cancer in her father; Rheum arthritis in her paternal grandmother.  Allergies  Allergen Reactions   Prednisolone Swelling   Tizanidine Anaphylaxis   Silicone Rash    Skin becomes raw    Skin becomes raw   Penicillins Other (See Comments)    Pt can not remember if she had an allergy to Penicillin as a child   Ace Inhibitors Cough   Ciprofloxacin  Other (See Comments)    Muscle pain   Codeine Phosphate Other (See Comments)    REACTION: nausea, vomiting   Kiwi Extract Nausea And Vomiting  Latex Rash   Tramadol Swelling       PHYSICAL EXAMINATION: Vital signs: BP 104/70   Pulse 87   Ht 5' 4 (1.626 m)   Wt 147 lb (66.7 kg)   BMI 25.23 kg/m   Constitutional: generally well-appearing, no acute distress Psychiatric: alert and oriented x3, cooperative Eyes: extraocular movements intact, anicteric, conjunctiva pink Mouth: oral pharynx moist, no lesions Neck: supple no lymphadenopathy Cardiovascular: heart regular rate and rhythm, no murmur Lungs: clear to auscultation bilaterally Abdomen: soft, nontender, nondistended, no obvious ascites, no peritoneal signs, normal bowel sounds, no organomegaly Rectal: Omitted Extremities: no clubbing,  cyanosis, or lower extremity edema bilaterally Skin: no lesions on visible extremities Neuro: No focal deficits.  Cranial nerves intact  ASSESSMENT:  1.  GERD complicated by peptic stricture.  Asymptomatic post dilation PPI 2.  History of sessile serrated polyps.  Aged out of surveillance   PLAN:  1.  Continue pantoprazole  40 mg daily.  Prescription refilled.  Medication risks reviewed 2.  Reflux precautions 3.  Routine GI office follow-up 1 year.  Contact the office in the interim for any questions or problems A total time of 30 minutes was spent preparing to see the patient, obtaining interval history, performing medically appropriate physical exam, counseling and educating the patient regarding the above listed issues, defining follow-up intervals, and documenting clinical information in the health record

## 2024-03-27 DIAGNOSIS — R32 Unspecified urinary incontinence: Secondary | ICD-10-CM | POA: Diagnosis not present

## 2024-03-27 DIAGNOSIS — Z01411 Encounter for gynecological examination (general) (routine) with abnormal findings: Secondary | ICD-10-CM | POA: Diagnosis not present

## 2024-04-09 DIAGNOSIS — L4050 Arthropathic psoriasis, unspecified: Secondary | ICD-10-CM | POA: Diagnosis not present

## 2024-04-10 LAB — LAB REPORT - SCANNED: EGFR: 91

## 2024-04-13 ENCOUNTER — Ambulatory Visit: Admitting: Family Medicine

## 2024-04-16 ENCOUNTER — Telehealth: Admitting: Family Medicine

## 2024-04-16 VITALS — Ht 64.0 in | Wt 141.0 lb

## 2024-04-16 DIAGNOSIS — E875 Hyperkalemia: Secondary | ICD-10-CM

## 2024-04-16 DIAGNOSIS — N3000 Acute cystitis without hematuria: Secondary | ICD-10-CM | POA: Diagnosis not present

## 2024-04-16 DIAGNOSIS — K219 Gastro-esophageal reflux disease without esophagitis: Secondary | ICD-10-CM | POA: Diagnosis not present

## 2024-04-16 DIAGNOSIS — E785 Hyperlipidemia, unspecified: Secondary | ICD-10-CM

## 2024-04-16 DIAGNOSIS — I10 Essential (primary) hypertension: Secondary | ICD-10-CM

## 2024-04-16 NOTE — Patient Instructions (Addendum)
 Please go to [facility] - located [address] across the [address] from [place] - in the basement - Hours: 7:30-5:30 PM M-F. You do NOT need an appointment.    hypertension has been well controlled in office but no home check today- she agrees to check and update me after we get potassium levels back. We may reduce her dose of irbesartan  75 mg to half of this if she can split it vs adding in diuretic like hydrochlorothiazide depending on results  Recommended follow up: Return in about 6 months (around 10/15/2024) for followup or sooner if needed.Schedule b4 you leave.

## 2024-04-16 NOTE — Progress Notes (Signed)
 Phone [phone] Virtual visit via Video note   Subjective:  Chief complaint: Chief Complaint  Patient presents with   Medical Management of Chronic Issues    6 month follow up; would like to speak about labs from rheumatologist; would like to know if azo bladder is okay to take daily; should she still be on a low potassium diet;     Our team/I connected with Destiny Acosta at  4:00 PM EST by a video enabled telemedicine application (caregility through epic) and verified that I am speaking with the correct person using two identifiers. Our team/I discussed the limitations of evaluation and management by telemedicine and the availability of in person appointments.No physical exam was performed (except for noted visual exam or audio findings with Telehealth visits).   Location patient: [facility] Location provider: [facility], office Persons participating in the virtual visit:  patient  Past Medical History-  Patient Active Problem List   Diagnosis Date Noted   Spinal stenosis of lumbar region 01/14/2016    Priority: Medium    Glaucoma 04/16/2015    Priority: Medium    Suprapubic discomfort 09/17/2014    Priority: Medium    Erosive osteoarthritis of both hands 08/10/2011    Priority: Medium    Mild aortic regurgitation 05/14/2010    Priority: Medium    Hyperlipidemia 12/06/2006    Priority: Medium    Hypertension 12/06/2006    Priority: Medium    GERD (gastroesophageal reflux disease) 12/06/2006    Priority: Medium    Primary open-angle glaucoma 08/30/2016    Priority: Low   Palpitations 04/16/2015    Priority: Low   Allergic rhinitis 06/10/2014    Priority: Low   Insomnia 06/10/2014    Priority: Low   History of adenomatous polyp of colon 12/21/2006    Priority: Low    Medications- reviewed and updated Current Outpatient Medications  Medication Sig Dispense Refill   Calcium Carbonate-Vitamin D  600-400 MG-UNIT tablet Take 1 tablet by mouth daily.      dorzolamide-timolol (COSOPT) 22.3-6.8 MG/ML ophthalmic solution INTILL 1 DROP INTO BOTH EYES TWICE A DAY     estradiol (ESTRACE) 0.1 MG/GM vaginal cream Place 1 g vaginally once a week.     irbesartan  (AVAPRO ) 75 MG tablet TAKE 1 TABLET BY MOUTH EVERY DAY 90 tablet 3   latanoprost (XALATAN) 0.005 % ophthalmic solution Place 1 drop into both eyes at bedtime.     Multiple Vitamin (MULTIVITAMIN) capsule Take 1 capsule by mouth daily.     pantoprazole  (PROTONIX ) 40 MG tablet Take 1 tablet (40 mg total) by mouth daily. 90 tablet 3   tretinoin (RETIN-A) 0.1 % cream Apply topically at bedtime.     No current facility-administered medications for this visit.     Objective:  Ht 5' 4 (1.626 m)   Wt 141 lb (64 kg)   BMI 24.20 kg/m  self reported vitals Gen: NAD, resting comfortably Lungs: nonlabored, normal respiratory rate  Skin: appears dry, no obvious rash     Assessment and Plan   #hypertension #hyperkalemia S: medication: Irbesartan  75 mg (cough on lisinopril  in the past) Home readings #s: no recent checks Feels she can improve her exercise A/P: hypertension has been well controlled in office but no home check today- she agrees to check and update me after we get potassium levels back. We may reduce her dose of irbesartan  75 mg to half of this if she can split it vs adding in diuretic like hydrochlorothiazide depending on results   #  hyperlipidemia-with family history of CAD in mother at 35 S: Medication: none Coronary CT 10/01/2019 with score of 0  Lab Results  Component Value Date   CHOL 224 (H) 05/09/2023   HDL 62.80 05/09/2023   LDLCALC 130 (H) 05/09/2023   LDLDIRECT 119.0 06/01/2017   TRIG 152.0 (H) 05/09/2023   CHOLHDL 4 05/09/2023   A/P: cholesterol mildly elevated last year but with reassuring CT calcium scoring hold off on medication(s)-s likely recheck next visit   # GERD S: Medication: pantoprazole  40 mg- in past 20 mg -Patient with flareup of reflux in December 2020.   H. pylori negative at that time (prior omeprazole ).  -chronic digestive symptoms interestingly enough- got treated for urinary tract infection with nitrofurantoin and symptom(s) resolved- apparently she was asymptomatic - she feels some of those gastroenterology symptom(s) returning A/P: GERD but has been told cannot stop her pantoprazole  which worries her with her kidneys long term but still . Controlled continue current medications   % Erosive osteoarthritis of both hands-follow[facility] at [facility].  Working with Dr. Leni in 2025- had also seen Dr. Mai and Dr. Lenon of rheumatology in past.   Recommended follow up:  6 month follow up  Future Appointments  Date Time Provider Department Center  07/09/2024  1:40 PM [facility] ANNUAL WELLNESS VISIT 1 [facility] Destiny Acosta    Lab/Order associations:   ICD-10-CM   1. Hyperkalemia  E87.5     2. History of UTI  Z87.440       No orders of the defined types were placed in this encounter.  Return precautions advised.  Destiny Lukes, MD

## 2024-04-18 ENCOUNTER — Other Ambulatory Visit (INDEPENDENT_AMBULATORY_CARE_PROVIDER_SITE_OTHER)

## 2024-04-18 DIAGNOSIS — E875 Hyperkalemia: Secondary | ICD-10-CM

## 2024-04-18 DIAGNOSIS — N3000 Acute cystitis without hematuria: Secondary | ICD-10-CM

## 2024-04-18 LAB — BASIC METABOLIC PANEL WITH GFR
BUN: 14 mg/dL (ref 6–23)
CO2: 28 meq/L (ref 19–32)
Calcium: 9.6 mg/dL (ref 8.4–10.5)
Chloride: 100 meq/L (ref 96–112)
Creatinine, Ser: 0.63 mg/dL (ref 0.40–1.20)
GFR: 84.33 mL/min (ref >60.00)
Glucose, Bld: 88 mg/dL (ref 70–99)
Potassium: 3.9 meq/L (ref 3.5–5.1)
Sodium: 135 meq/L (ref 135–145)

## 2024-04-19 ENCOUNTER — Ambulatory Visit: Payer: Self-pay | Admitting: Family Medicine

## 2024-04-19 LAB — URINE CULTURE
MICRO NUMBER:: 17338076
Result:: NO GROWTH
SPECIMEN QUALITY:: ADEQUATE

## 2024-04-24 DIAGNOSIS — H401131 Primary open-angle glaucoma, bilateral, mild stage: Secondary | ICD-10-CM | POA: Diagnosis not present

## 2024-04-24 DIAGNOSIS — H532 Diplopia: Secondary | ICD-10-CM | POA: Diagnosis not present

## 2024-04-28 ENCOUNTER — Other Ambulatory Visit: Payer: Self-pay | Admitting: Family Medicine

## 2024-05-01 NOTE — Telephone Encounter (Signed)
 Please review and advise. Tks

## 2024-05-01 NOTE — Telephone Encounter (Signed)
 Message from patient regard at home Blood Pressures. Please review and advise. Tks

## 2024-05-02 NOTE — Telephone Encounter (Signed)
 MyChart response from patient.

## 2024-05-15 ENCOUNTER — Telehealth: Payer: Self-pay

## 2024-05-15 NOTE — Telephone Encounter (Signed)
 Copied from CRM [id number]. Topic: Clinical - Medication Question >> May 15, 2024  9:25 AM Ahlexyia S wrote: Reason for CRM: Pt called in stating that she thinks her blood pressure medication irbesartan  (AVAPRO ) 75 MG tablet is triggering her scalp psoriasis which pt was recently diagnosed with by one of her providers. Pt has to get an infusion once a month but stated that her scalp is still really itchy. Pt is requesting a callback regarding this.

## 2024-05-15 NOTE — Telephone Encounter (Signed)
 Is this possible? I am unsure of how to answer this when I call her.

## 2024-05-15 NOTE — Telephone Encounter (Signed)
 Could be associated. The issue arises that it is still probably safer/ess likely than other classes [email or web address]   Angiotensin receptor blockers appear to be the most appropriate treatment for hypertensive patients with psoriasis, with the potential addition of calcium channel blockers if blood pressure is not adequately controlled. Alternatives are angiotensin-converting enzyme inhibitors and/or mineralocorticoid receptor antagonists. Diuretics and beta blockers are associated with greater risk of worsening of psoriatic lesions.  We could sit down to review if desired- so yes basically this could contribute but we need to control her blood pressure and likely as safe or safer than other medication options

## 2024-05-16 NOTE — Telephone Encounter (Signed)
 Spoke with patient and scheduled OV for 05/29/2023

## 2024-05-28 ENCOUNTER — Ambulatory Visit: Admitting: Family Medicine

## 2024-05-28 ENCOUNTER — Encounter: Payer: Self-pay | Admitting: Family Medicine

## 2024-05-28 VITALS — BP 118/72 | HR 86 | Temp 98.1°F | Ht 64.0 in | Wt 150.2 lb

## 2024-05-28 DIAGNOSIS — I10 Essential (primary) hypertension: Secondary | ICD-10-CM | POA: Diagnosis not present

## 2024-05-28 DIAGNOSIS — L4050 Arthropathic psoriasis, unspecified: Secondary | ICD-10-CM

## 2024-05-28 NOTE — Progress Notes (Signed)
 " Phone [phone] In person visit   Subjective:   Destiny Acosta is a 79 y.o. year old very pleasant female patient who presents for/with See problem oriented charting Chief Complaint  Patient presents with   Psoriasis    Would like to discuss her issues with psoriatic arthiris and her medications (blood pressure); has been having extreme pain in both hands at night; scalp psoriasis is getting worse again;     Past Medical History-  Patient Active Problem List   Diagnosis Date Noted   Spinal stenosis of lumbar region 01/14/2016    Priority: Medium    Glaucoma 04/16/2015    Priority: Medium    Suprapubic discomfort 09/17/2014    Priority: Medium    Erosive osteoarthritis of both hands 08/10/2011    Priority: Medium    Mild aortic regurgitation 05/14/2010    Priority: Medium    Hyperlipidemia 12/06/2006    Priority: Medium    Hypertension 12/06/2006    Priority: Medium    GERD (gastroesophageal reflux disease) 12/06/2006    Priority: Medium    Primary open-angle glaucoma 08/30/2016    Priority: Low   Palpitations 04/16/2015    Priority: Low   Allergic rhinitis 06/10/2014    Priority: Low   Insomnia 06/10/2014    Priority: Low   History of adenomatous polyp of colon 12/21/2006    Priority: Low    Medications- reviewed and updated Current Outpatient Medications  Medication Sig Dispense Refill   Calcium Carbonate-Vitamin D  600-400 MG-UNIT tablet Take 1 tablet by mouth daily.     dorzolamide-timolol (COSOPT) 22.3-6.8 MG/ML ophthalmic solution INTILL 1 DROP INTO BOTH EYES TWICE A DAY     estradiol (ESTRACE) 0.1 MG/GM vaginal cream Place 1 g vaginally once a week.     irbesartan  (AVAPRO ) 75 MG tablet TAKE 1 TABLET BY MOUTH EVERY DAY 90 tablet 1   latanoprost (XALATAN) 0.005 % ophthalmic solution Place 1 drop into both eyes at bedtime.     Multiple Vitamin (MULTIVITAMIN) capsule Take 1 capsule by mouth daily.     pantoprazole  (PROTONIX ) 40 MG tablet Take 1 tablet (40 mg  total) by mouth daily. 90 tablet 3   tretinoin (RETIN-A) 0.1 % cream Apply topically at bedtime.     No current facility-administered medications for this visit.     Objective:  BP 118/72 (BP Location: Left Arm, Patient Position: Sitting, Cuff Size: Normal)   Pulse 86   Temp 98.1 F (36.7 C) (Temporal)   Ht 5' 4 (1.626 m)   Wt 150 lb 3.2 oz (68.1 kg)   SpO2 95%   BMI 25.78 kg/m  Gen: NAD, resting comfortably CV: RRR no murmurs rubs or gallops Lungs: CTAB no crackles, wheeze, rhonchi Ext: minimal edema Skin: warm, dry     Assessment and Plan   # Psoriatic arthritis S:patient has been having ongoing pain- at first thought much improved on cosentyx  but lately concerned and waking up at night with pain in her hands in last few weeks . She has upcoming infusion and hoping it may settle things but if her eyes can tolerate she asks about trying prednisone. She has read case reports on irbesartan  contributing to psoriasis and in case any link she brings up great question about trying off or alternate today A/P: ongoing pain- has upcoming follow up with Dr. Curt and upcoming infusion- see hypertension section below- hoping to possibly help with medication(s) adjustment- continue current medications for now   #hypertension S: medication: Irbesartan  75  mg (cough on lisinopril  in the past) Home readings #s: 110s to 140s A/P: Blood pressure is well controlled today at 118/72 and at most she has seen 130s at infusions of cosentyx  but has concern about possible relation to psoriasis that was discovered on case reports. We discussed a few options Continue current medications and get Dr. Efraim opinion Blood pressure looks good so hold medicine and only take if blood pressure >140/90 Switch to new medicine like amlodipine (she gets concerned about ankle swelling so opts out)and or take it only if blood pressure elevated  In the end she choose #2 and is going to take irbesartan  only if blood  pressure elevateds over 140/90 and she will monitor blood pressure daily   As far as other optoins beta blockers look like higher risk. Hydrochlorothiazide also has risk of psoriasis so options limited. Also mentioned proton pump inhibitor (PPI) stomach acid reducer and could have some risk but mainly retrospective observation  Recommended follow up: Return in about 6 months (around 11/25/2024) for followup or sooner if needed.Schedule b4 you leave. Future Appointments  Date Time Provider Department Center  07/09/2024  1:40 PM [facility] ANNUAL WELLNESS VISIT 1 [facility] [place]    Lab/Order associations:   ICD-10-CM   1. Psoriatic arthritis (HCC)  L40.50     2. Primary hypertension  I10       No orders of the defined types were placed in this encounter.   Return precautions advised.  Garnette Lukes, MD  "

## 2024-05-28 NOTE — Patient Instructions (Addendum)
 Blood pressure is well controlled today at 118/72 and at most she has seen 130s at infusions of cosentyx  but has concern about possible relation to psoriasis that was discovered on case reports. We discussed a few options Continue current medications and get Dr. Efraim opinion Blood pressure looks good so hold medicine and only take if blood pressure >140/90 Switch to new medicine like amlodipine (she gets concerned about ankle swelling so opts out)and or take it only if blood pressure elevated  In the end she choose #2 and is going to take irbesartan  only if blood pressure elevates over 140/90 and she will monitor blood pressure daily   Recommended follow up: Return in about 6 months (around 11/25/2024) for followup or sooner if needed.Schedule b4 you leave.

## 2024-07-09 ENCOUNTER — Ambulatory Visit: Payer: Medicare Other

## 2024-11-26 ENCOUNTER — Ambulatory Visit: Admitting: Family Medicine
